# Patient Record
Sex: Male | Born: 1985 | Race: White | Hispanic: No | Marital: Single | Smoking: Former smoker
Health system: Southern US, Community
[De-identification: ages and names within clinical notes are randomized; demographics above are authoritative.]

## PROBLEM LIST (undated history)

## (undated) DIAGNOSIS — K219 Gastro-esophageal reflux disease without esophagitis: Secondary | ICD-10-CM

## (undated) DIAGNOSIS — I639 Cerebral infarction, unspecified: Secondary | ICD-10-CM

## (undated) DIAGNOSIS — E785 Hyperlipidemia, unspecified: Secondary | ICD-10-CM

## (undated) DIAGNOSIS — Z720 Tobacco use: Secondary | ICD-10-CM

## (undated) DIAGNOSIS — F909 Attention-deficit hyperactivity disorder, unspecified type: Secondary | ICD-10-CM

## (undated) DIAGNOSIS — I5022 Chronic systolic (congestive) heart failure: Secondary | ICD-10-CM

## (undated) DIAGNOSIS — I10 Essential (primary) hypertension: Secondary | ICD-10-CM

## (undated) HISTORY — PX: KNEE SURGERY: SHX244

## (undated) HISTORY — PX: EYE SURGERY: SHX253

---
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
## 2006-07-22 DIAGNOSIS — F909 Attention-deficit hyperactivity disorder, unspecified type: Secondary | ICD-10-CM | POA: Insufficient documentation

## 2012-09-08 ENCOUNTER — Encounter (HOSPITAL_COMMUNITY): Payer: Self-pay | Admitting: Emergency Medicine

## 2012-09-08 ENCOUNTER — Emergency Department (HOSPITAL_COMMUNITY)
Admission: EM | Admit: 2012-09-08 | Discharge: 2012-09-09 | Disposition: A | Discharge status: Home / Self Care | Payer: BC Managed Care – PPO | Attending: Emergency Medicine | Admitting: Emergency Medicine

## 2012-09-08 DIAGNOSIS — S61012A Laceration without foreign body of left thumb without damage to nail, initial encounter: Secondary | ICD-10-CM

## 2012-09-08 DIAGNOSIS — S61209A Unspecified open wound of unspecified finger without damage to nail, initial encounter: Secondary | ICD-10-CM | POA: Insufficient documentation

## 2012-09-08 DIAGNOSIS — Y990 Civilian activity done for income or pay: Secondary | ICD-10-CM | POA: Insufficient documentation

## 2012-09-08 DIAGNOSIS — Z79899 Other long term (current) drug therapy: Secondary | ICD-10-CM | POA: Insufficient documentation

## 2012-09-08 DIAGNOSIS — F17200 Nicotine dependence, unspecified, uncomplicated: Secondary | ICD-10-CM | POA: Insufficient documentation

## 2012-09-08 DIAGNOSIS — Y9289 Other specified places as the place of occurrence of the external cause: Secondary | ICD-10-CM | POA: Insufficient documentation

## 2012-09-08 DIAGNOSIS — W268XXA Contact with other sharp object(s), not elsewhere classified, initial encounter: Secondary | ICD-10-CM | POA: Insufficient documentation

## 2012-09-08 NOTE — ED Notes (Signed)
PT. ACCIDENTALLY HIT HIS LEFT THUMB WITH A KNIFE THIS EVENING , PRESENTS WITH LACERATION AT TIP OF LEFT THUMB , BLEEDING CONTROLLED , DRESSING APPLIED AT TRIAGE.

## 2012-09-09 NOTE — ED Provider Notes (Signed)
Medical screening examination/treatment/procedure(s) were performed by non-physician practitioner and as supervising physician I was immediately available for consultation/collaboration.   Aemon L Nasim Garofano, MD 09/09/12 0554 

## 2012-09-09 NOTE — ED Provider Notes (Signed)
History     CSN: [id number]  Arrival date & time 09/08/12  2210   First MD Initiated Contact with Patient 09/09/12 0124      Chief Complaint  Patient presents with  . Laceration    (Consider location/radiation/quality/duration/timing/severity/associated sxs/prior treatment) Patient is a 27 y.o. male presenting with skin laceration. The history is provided by the patient, the spouse and medical records. No language interpreter was used.  Laceration Location:  Finger Finger laceration location:  L thumb Length (cm):  6 Depth:  Cutaneous Quality: straight   Bleeding: controlled   Time since incident:  4 hours Laceration mechanism:  Razor Pain details:    Quality:  Aching   Severity:  Mild   Timing:  Constant   Progression:  Unchanged Foreign body present:  No foreign bodies Relieved by:  Nothing Worsened by:  Nothing tried Ineffective treatments:  None tried Tetanus status:  Up to date   Austin Tran is a 27 y.o. male  with no medical history presents to the Emergency Department complaining of heat, persistent, stabilized laceration to the left thumb approximately 4 hours prior to arrival. Patient states he was working in his wood shop with a box cutter when the box cutter slipped and cut his left thumb. He applied pressure dressing at home but had no other treatments.  Associated symptoms include pain at the site and bleeding.  Nothing makes it better and palpation makes it worse.  Pt denies fever, chills, headache, neck pain, numbness, tingling, weakness, syncope.  Patient is up-to-date and less than 5 years.    History reviewed. No pertinent past medical history.  Past Surgical History  Procedure Laterality Date  . Knee surgery      No family history on file.  History  Substance Use Topics  . Smoking status: Current Some Day Smoker  . Smokeless tobacco: Not on file  . Alcohol Use: Yes      Review of Systems  Constitutional: Negative for fever.  Skin:  Positive for wound.  Neurological: Negative for weakness and numbness.    Allergies  Review of patient's allergies indicates no known allergies.  Home Medications   Current Outpatient Rx  Name  Route  Sig  Dispense  Refill  . amphetamine-dextroamphetamine (ADDERALL) 30 MG tablet   Oral   Take 30 mg by mouth 2 (two) times daily.         Marland Kitchen doxycycline (VIBRAMYCIN) 100 MG capsule   Oral   Take 100 mg by mouth daily.           BP 150/88  Pulse 92  Temp(Src) 98.8 F (37.1 C) (Oral)  Resp 14  SpO2 98%  Physical Exam  Nursing note and vitals reviewed. Constitutional: He is oriented to person, place, and time. He appears well-developed and well-nourished. No distress.  HENT:  Head: Normocephalic and atraumatic.  Eyes: Conjunctivae are normal. No scleral icterus.  Neck: Normal range of motion.  Cardiovascular: Normal rate, regular rhythm, normal heart sounds and intact distal pulses.   No murmur heard. Capillary refill < 3 sec  Pulmonary/Chest: Effort normal and breath sounds normal. No respiratory distress. He has no wheezes.  Musculoskeletal: Normal range of motion. He exhibits no edema.  Full range of motion of all fingers and wrist of the left upper extremity  Neurological: He is alert and oriented to person, place, and time.  Sensation intact to dull and sharp Strength 5 out of 5 in the left upper extremity including fingers, wrist  and elbow  Skin: Skin is warm and dry. He is not diaphoretic. No erythema.  6 cm laceration to the palmer side of the thumb, bleeding controlled  Psychiatric: He has a normal mood and affect.    ED Course  LACERATION REPAIR Date/Time: 09/09/2012 1:57 AM Performed by: Dierdre Forth Authorized by: Dierdre Forth Consent: Verbal consent obtained. Risks and benefits: risks, benefits and alternatives were discussed Consent given by: patient Patient understanding: patient states understanding of the procedure being  performed Patient consent: the patient's understanding of the procedure matches consent given Procedure consent: procedure consent matches procedure scheduled Relevant documents: relevant documents present and verified Site marked: the operative site was marked Required items: required blood products, implants, devices, and special equipment available Patient identity confirmed: verbally with patient and arm band Time out: Immediately prior to procedure a "time out" was called to verify the correct patient, procedure, equipment, support staff and site/side marked as required. Body area: upper extremity Location details: left thumb Laceration length: 4 cm Foreign bodies: no foreign bodies Tendon involvement: none Nerve involvement: none Vascular damage: no Patient sedated: no Preparation: Patient was prepped and draped in the usual sterile fashion. Irrigation solution: saline and tap water Irrigation method: syringe and tap Amount of cleaning: standard Debridement: none Degree of undermining: none Skin closure: glue Approximation: close Approximation difficulty: simple Patient tolerance: Patient tolerated the procedure well with no immediate complications.   (including critical care time)  Labs Reviewed - No data to display No results found.   1. Laceration of left thumb, initial encounter       MDM  Austin Tran presents with laceration.  Laceration superficial without tendon or nerve involvement.  Patient requests Dermabond and no stitches.  Discussed the possible inadequacies of Dermabond and the patient prefers this treatment.  Tdap booster up to date. Pressure irrigation performed. Laceration occurred < 8 hours prior to repair which was well tolerated. Pt has no co morbidities to effect normal wound healing. Discussed glue home care w pt and answered questions. Pt to f-u for wound check in 7 days. Pt is hemodynamically stable w no complaints prior to dc.  I have also  discussed reasons to return immediately to the ER.  Patient expresses understanding and agrees with plan.           Austin Client Saahas Hidrogo, PA-C 09/09/12 0205

## 2015-11-20 ENCOUNTER — Encounter (HOSPITAL_COMMUNITY): Payer: Self-pay | Admitting: Emergency Medicine

## 2015-11-20 ENCOUNTER — Ambulatory Visit (INDEPENDENT_AMBULATORY_CARE_PROVIDER_SITE_OTHER): Payer: BLUE CROSS/BLUE SHIELD | Admitting: Family Medicine

## 2015-11-20 ENCOUNTER — Emergency Department (HOSPITAL_COMMUNITY): Admission: EM | Admit: 2015-11-20 | Discharge status: Absent without leave | Payer: Self-pay

## 2015-11-20 ENCOUNTER — Emergency Department (HOSPITAL_COMMUNITY): Payer: BLUE CROSS/BLUE SHIELD

## 2015-11-20 ENCOUNTER — Inpatient Hospital Stay (HOSPITAL_COMMUNITY)
Admission: EM | Admit: 2015-11-20 | Discharge: 2015-11-23 | DRG: 065 | Disposition: A | Discharge status: Home / Self Care | Payer: BLUE CROSS/BLUE SHIELD | Attending: Internal Medicine | Admitting: Internal Medicine

## 2015-11-20 VITALS — BP 132/90 | HR 115 | Temp 97.9°F | Resp 22 | Ht 70.25 in | Wt 197.2 lb

## 2015-11-20 DIAGNOSIS — R4701 Aphasia: Secondary | ICD-10-CM | POA: Diagnosis present

## 2015-11-20 DIAGNOSIS — F909 Attention-deficit hyperactivity disorder, unspecified type: Secondary | ICD-10-CM | POA: Diagnosis present

## 2015-11-20 DIAGNOSIS — I420 Dilated cardiomyopathy: Secondary | ICD-10-CM

## 2015-11-20 DIAGNOSIS — E785 Hyperlipidemia, unspecified: Secondary | ICD-10-CM | POA: Diagnosis present

## 2015-11-20 DIAGNOSIS — F1010 Alcohol abuse, uncomplicated: Secondary | ICD-10-CM | POA: Diagnosis present

## 2015-11-20 DIAGNOSIS — F17210 Nicotine dependence, cigarettes, uncomplicated: Secondary | ICD-10-CM | POA: Diagnosis present

## 2015-11-20 DIAGNOSIS — I6340 Cerebral infarction due to embolism of unspecified cerebral artery: Principal | ICD-10-CM | POA: Diagnosis present

## 2015-11-20 DIAGNOSIS — R29701 NIHSS score 1: Secondary | ICD-10-CM | POA: Diagnosis present

## 2015-11-20 DIAGNOSIS — I639 Cerebral infarction, unspecified: Secondary | ICD-10-CM

## 2015-11-20 DIAGNOSIS — F05 Delirium due to known physiological condition: Secondary | ICD-10-CM

## 2015-11-20 DIAGNOSIS — Z79899 Other long term (current) drug therapy: Secondary | ICD-10-CM

## 2015-11-20 DIAGNOSIS — Z8249 Family history of ischemic heart disease and other diseases of the circulatory system: Secondary | ICD-10-CM

## 2015-11-20 DIAGNOSIS — I63412 Cerebral infarction due to embolism of left middle cerebral artery: Secondary | ICD-10-CM | POA: Diagnosis present

## 2015-11-20 HISTORY — DX: Attention-deficit hyperactivity disorder, unspecified type: F90.9

## 2015-11-20 NOTE — ED Notes (Signed)
Pt. Made aware of the need for urine.

## 2015-11-20 NOTE — Progress Notes (Signed)
   HPI  Patient presents today here with slurred speech and confusion.   Pt and his girlfriend explain that he woke up this am with a confusional state, he had difficulty forming words and sentences at that time. He had what they describe as a panic attack and made himself vomit wondering if he had ingested something causing symptoms. He vomited several more times and his girlfreind states that he was sweaty.   Since then he has calmed down but continues to have difficulty forming sentences. He denies any HA, weakness, numbness or tinglingm, or other difficulty.   Last night he went out and had 2-3 beers. He is on adderall for the last 15 years or so.   He denies any substance use other than prescribed adderall and alcohol  PMH: Smoking status noted, has ADHD Surgical Hx- eye surgery Family Hx - states not sure Drinks 3-4 drinks per week, denies substance use ROS: Per HPI  Objective: BP 132/90 mmHg  Pulse 115  Temp(Src) 97.9 F (36.6 C) (Oral)  Resp 22  Ht 5' 10.25" (1.784 m)  Wt 197 lb 3.2 oz (89.449 kg)  BMI 28.11 kg/m2  SpO2 100% Gen: NAD, alert, cooperative with exam HEENT: NCAT, EOMI, PERRL  CV: RRR, good S1/S2, no murmur Resp: CTABL, no wheezes, non-labored Ext: No edema, warm Neuro: Alert and oriented, Strength 5/5 and sensation intact in BL Upper and lower extremities CN 2-12 intact, Symmetric smile, normal but slow gait.  Speech slightlyu slurred and he has some difficulty forming sentences and thoughts. He appears frustrated with the symptoms.   Assessment and plan:  # Confusion and slurred speech Given his symptoms for 6-8 hours I have recommended transfer to the ED via ambulance for possible stroke work up He declines EMS but states he will go straight to the ED with girlfriend to drive him.  Consider also substance ingestion Appreciate ED eval and treatment, triage nursing at ED has been notified. Pt will go to [place].     Kevin Fenton, MD 3:45  PM

## 2015-11-20 NOTE — ED Notes (Signed)
Not in lobby

## 2015-11-20 NOTE — ED Provider Notes (Signed)
CSN: [id number]     Arrival date & time 11/20/15  2155 History  By signing my name below, I, Emmanuella Mensah, attest that this documentation has been prepared under the direction and in the presence of Paula Libra, MD. Electronically Signed: Angelene Giovanni, ED Scribe. 11/20/2015. 11:13 PM.     Chief Complaint  Patient presents with  . Aphasia   The history is provided by the patient. No language interpreter was used.    HPI Comments: Austin Tran is a 30 y.o. male who presents to the Emergency Department complaining of a gradually improving episode of moderate expressive aphasia (difficulty expressing words and forming sentences) that was first noticed at 12:30 pm today after awakening from a nap. There are no specific mitigating or exacerbating factors.  He reports associated, vaguely described, bilateral visual disturbance with trouble reading that lasted for 15 minutes. He forced himself to vomit once thinking he might have been poisoned.   He denies any difficulty understanding what people are saying to him. Pt was seen at Urgent [facility] Today and was advised to come to the ED for further evaluation. For the past few weeks he has been having intermittent episodes while sleeping where is awakened by breathing difficulty (gasping for air). Pt has not tried any medications PTA. He reports that his father and grandfather both use a C-PAP for OSA. He denies any numbness, weakness, headache, involuntary vomiting, diarrhea, fever, chills, chest pain, or SOB.   History reviewed. No pertinent past medical history. Past Surgical History  Procedure Laterality Date  . Knee surgery    . Eye surgery     Family History  Problem Relation Age of Onset  . Hypertension Mother   . Hypertension Father   . Cancer Maternal Grandmother    Social History  Substance Use Topics  . Smoking status: Current Some Day Smoker    Types: Cigarettes  . Smokeless tobacco: None  . Alcohol Use: 7.2  oz/week    12 Cans of beer per week    Review of Systems  All other systems reviewed and are negative.   Allergies  Review of patient's allergies indicates no known allergies.  Home Medications   Prior to Admission medications   Medication Sig Start Date End Date Taking? Authorizing Provider  acetaminophen (TYLENOL) 500 MG tablet Take 1,000 mg by mouth every 6 (six) hours as needed for mild pain.   Yes Historical Provider, MD  amphetamine-dextroamphetamine (ADDERALL) 30 MG tablet Take 30 mg by mouth 2 (two) times daily.   Yes Historical Provider, MD   BP 112/81 mmHg  Pulse 101  Temp(Src) 97.7 F (36.5 C) (Oral)  Resp 18  Ht  (1.778 m)  Wt 197 lb (89.359 kg)  BMI 28.27 kg/m2  SpO2 97%   Physical Exam  Nursing note and vitals reviewed. General: Well-developed, well-nourished male in no acute distress; appearance consistent with age of record HENT: normocephalic; atraumatic Eyes: pupils equal, round and reactive to light; extraocular muscles intact Neck: supple; no bruit Heart: regular rate and rhythm Lungs: clear to auscultation bilaterally Abdomen: soft; nondistended; nontender; no masses or hepatosplenomegaly; bowel sounds present Extremities: No deformity; full range of motion; pulses normal Neurologic: Awake, alert and oriented; motor function intact in all extremities and symmetric; no facial droop; visual fields intact; negative Romberg; normal finger to nose; sensation intact and symmetric; occasional stumbling over words when speaking; no dysarthria; no ataxia Skin: Warm and dry Psychiatric: Normal mood and affect  ED Course  Procedures (including critical care time)   MDM  Nursing notes and vitals signs, including pulse oximetry, reviewed.  Summary of this visit's results, reviewed by myself:   EKG Interpretation  Date/Time:  Wednesday November 20 2015 23:57:51 EDT Ventricular Rate:  109 PR Interval:    QRS Duration: 100 QT Interval:  341 QTC  Calculation: 460 R Axis:   32 Text Interpretation:  Sinus tachycardia LAE, consider biatrial enlargement Nonspecific T abnormalities, lateral leads No previous ECGs available Confirmed by Mylo Choi  MD, Jonny Ruiz ([id number]) on 11/21/2015 12:02:14 AM Also confirmed by Read Drivers  MD, Jonny Ruiz ([id number]), editor Wandalee Ferdinand [phone])  on 11/21/2015 7:33:48 AM       Labs:  Results for orders placed or performed during the hospital encounter of 11/20/15 (from the past 24 hour(s))  Protime-INR     Status: None   Collection Time: 11/21/15 12:45 AM  Result Value Ref Range   Prothrombin Time 14.7 11.6 - 15.2 seconds   INR 1.18 0.00 - 1.49  APTT     Status: None   Collection Time: 11/21/15 12:45 AM  Result Value Ref Range   aPTT 30 24 - 37 seconds  CBC     Status: None   Collection Time: 11/21/15 12:45 AM  Result Value Ref Range   WBC 10.5 4.0 - 10.5 K/uL   RBC 5.11 4.22 - 5.81 MIL/uL   Hemoglobin 15.8 13.0 - 17.0 g/dL   HCT 91.4 78.2 - 95.6 %   MCV 87.1 78.0 - 100.0 fL   MCH 30.9 26.0 - 34.0 pg   MCHC 35.5 30.0 - 36.0 g/dL   RDW 21.3 08.6 - 57.8 %   Platelets 257 150 - 400 K/uL  Differential     Status: None   Collection Time: 11/21/15 12:45 AM  Result Value Ref Range   Neutrophils Relative % 63 %   Neutro Abs 6.6 1.7 - 7.7 K/uL   Lymphocytes Relative 28 %   Lymphs Abs 2.9 0.7 - 4.0 K/uL   Monocytes Relative 8 %   Monocytes Absolute 0.8 0.1 - 1.0 K/uL   Eosinophils Relative 1 %   Eosinophils Absolute 0.1 0.0 - 0.7 K/uL   Basophils Relative 0 %   Basophils Absolute 0.0 0.0 - 0.1 K/uL  Comprehensive metabolic panel     Status: Abnormal   Collection Time: 11/21/15 12:45 AM  Result Value Ref Range   Sodium 137 135 - 145 mmol/L   Potassium 4.0 3.5 - 5.1 mmol/L   Chloride 106 101 - 111 mmol/L   CO2 20 (L) 22 - 32 mmol/L   Glucose, Bld 98 65 - 99 mg/dL   BUN 17 6 - 20 mg/dL   Creatinine, Ser 4.69 0.61 - 1.24 mg/dL   Calcium 9.1 8.9 - 62.9 mg/dL   Total Protein 7.0 6.5 - 8.1 g/dL   Albumin 4.1 3.5 -  5.0 g/dL   AST 24 15 - 41 U/L   ALT 26 17 - 63 U/L   Alkaline Phosphatase 68 38 - 126 U/L   Total Bilirubin 1.1 0.3 - 1.2 mg/dL   GFR calc non Af Amer >60 >60 mL/min   GFR calc Af Amer >60 >60 mL/min   Anion gap 11 5 - 15  Ethanol     Status: None   Collection Time: 11/21/15 12:45 AM  Result Value Ref Range   Alcohol, Ethyl (B) <5 <5 mg/dL  Urine rapid drug screen (hosp performed)not at [facility]  Status: Abnormal   Collection Time: 11/21/15 12:47 AM  Result Value Ref Range   Opiates NONE DETECTED NONE DETECTED   Cocaine NONE DETECTED NONE DETECTED   Benzodiazepines NONE DETECTED NONE DETECTED   Amphetamines POSITIVE (A) NONE DETECTED   Tetrahydrocannabinol NONE DETECTED NONE DETECTED   Barbiturates NONE DETECTED NONE DETECTED  Urinalysis, Routine w reflex microscopic (not at [facility])     Status: Abnormal   Collection Time: 11/21/15 12:47 AM  Result Value Ref Range   Color, Urine AMBER (A) YELLOW   APPearance CLEAR CLEAR   Specific Gravity, Urine 1.031 (H) 1.005 - 1.030   pH 5.5 5.0 - 8.0   Glucose, UA NEGATIVE NEGATIVE mg/dL   Hgb urine dipstick NEGATIVE NEGATIVE   Bilirubin Urine SMALL (A) NEGATIVE   Ketones, ur 15 (A) NEGATIVE mg/dL   Protein, ur NEGATIVE NEGATIVE mg/dL   Nitrite NEGATIVE NEGATIVE   Leukocytes, UA NEGATIVE NEGATIVE  I-stat troponin, ED (not at [facility])     Status: None   Collection Time: 11/21/15 12:57 AM  Result Value Ref Range   Troponin i, poc 0.04 0.00 - 0.08 ng/mL   Comment 3          Antithrombin III     Status: None   Collection Time: 11/21/15 10:21 AM  Result Value Ref Range   AntiThromb III Func 98 75 - 120 %  Sedimentation rate     Status: None   Collection Time: 11/21/15  8:10 PM  Result Value Ref Range   Sed Rate 1 0 - 16 mm/hr    Imaging Studies: Ct Head Wo Contrast  11/21/2015  CLINICAL DATA:  30 year old male with a lesion. EXAM: CT HEAD WITHOUT CONTRAST TECHNIQUE: Contiguous axial images were obtained from the base of the skull  through the vertex without intravenous contrast. COMPARISON:  Head CT dated 10/15/2002 FINDINGS: The ventricles and the sulci are appropriate in size for the patient's age. There is no intracranial hemorrhage. No midline shift or mass effect identified. The gray-white matter differentiation is preserved. 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 The visualized paranasal sinuses and mastoid air cells are well aerated. The calvarium is intact. IMPRESSION: No acute intracranial pathology. Electronically Signed   By: Elgie Collard M.D.   On: 11/21/2015 00:00   Mr Brain Wo Contrast  11/21/2015  CLINICAL DATA:  Initial evaluation for acute onset expressive aphasia. EXAM: MRI HEAD WITHOUT CONTRAST TECHNIQUE: Multiplanar, multiecho pulse sequences of the brain and surrounding structures were obtained without intravenous contrast. COMPARISON:  Prior CT from 11/20/2015. FINDINGS: Cerebral volume within normal limits for patient age. No significant white matter disease present. There is abnormal gyriform restricted diffusion involving the cortical gray matter of the posterior left temporal lobe, suspicious for acute ischemic infarct. Associated T2/FLAIR abnormality without significant mass effect. No associated hemorrhage. No other abnormal foci of restricted diffusion. Gray-white matter differentiation otherwise maintained. Major intracranial vascular flow voids are preserved. No other areas of chronic infarction. No acute or chronic intracranial hemorrhage. No mass lesion, midline shift, or mass effect. No hydrocephalus. 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 No other extra-axial fluid collection. Major dural sinuses are grossly patent. Craniocervical junction within normal limits. Visualized upper cervical spine unremarkable. Pituitary gland within normal limits. No acute abnormality about the globes and orbits.  Mild scattered mucosal thickening within the ethmoidal air cells and maxillary sinuses. Paranasal sinuses are otherwise clear. No  mastoid effusion. Inner ear structures grossly normal. Bone marrow signal intensity within normal limits. No scalp soft tissue abnormality. IMPRESSION: 1. 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 No associated hemorrhage or mass effect. 2. Otherwise negative brain MRI. Electronically Signed   By: Rise Mu M.D.   On: 11/21/2015 07:29   2:02 AM No significant change in neurologic exam. Patient girlfriend states he still has difficulty with about every 10th word. Discussed with Dr. Roseanne Reno of neurology who recommends an MRI later this morning.  7:05 AM Awaiting results of MRI. Dr. Cyndie Chime will follow up on results and make disposition.   Final diagnoses:  Aphasia  Cerebral infarction due to unspecified mechanism   I personally performed the services described in this documentation, which was scribed in my presence. The recorded information has been reviewed and is accurate.   Paula Libra, MD 11/21/15 2251

## 2015-11-20 NOTE — ED Notes (Signed)
Pt being sent from UC, UC wanted pt to go to ED by ambulance but pt refused. Possible CVA.

## 2015-11-20 NOTE — ED Notes (Signed)
Pt stated he got out of bed at 1000 this morning and got to the bathroom and realized he was having trouble speaking and forming a sentence.  Pt states he had a panic attack because he did not know what was going on.  He also states that he has been having regular panic attacks for the past 2 weeks that he has never had before. On assessment, pt is forming sentences well with occasional mumbling.  Pt states he was nauseated but ate something before arrival and feels better.

## 2015-11-20 NOTE — Patient Instructions (Addendum)
   Please go directly to the emergency department    IF you received an x-ray today, you will receive an invoice from [facility]. Please contact [facility] at [phone] with questions or concerns regarding your invoice.   IF you received labwork today, you will receive an invoice from [employer]. Please contact [facility] at [phone] with questions or concerns regarding your invoice.   Our billing staff will not be able to assist you with questions regarding bills from these companies.  You will be contacted with the lab results as soon as they are available. The fastest way to get your results is to activate your My Chart account. Instructions are located on the last page of this paperwork. If you have not heard from [place] regarding the results in 2 weeks, please contact this office.

## 2015-11-20 NOTE — ED Notes (Signed)
Not present in lobby 

## 2015-11-21 ENCOUNTER — Observation Stay (HOSPITAL_COMMUNITY): Admit: 2015-11-21 | Payer: BLUE CROSS/BLUE SHIELD

## 2015-11-21 ENCOUNTER — Encounter (HOSPITAL_COMMUNITY): Payer: Self-pay | Admitting: Neurology

## 2015-11-21 ENCOUNTER — Observation Stay (HOSPITAL_COMMUNITY): Payer: BLUE CROSS/BLUE SHIELD

## 2015-11-21 ENCOUNTER — Observation Stay (HOSPITAL_BASED_OUTPATIENT_CLINIC_OR_DEPARTMENT_OTHER): Payer: BLUE CROSS/BLUE SHIELD

## 2015-11-21 ENCOUNTER — Emergency Department (HOSPITAL_COMMUNITY): Payer: BLUE CROSS/BLUE SHIELD

## 2015-11-21 DIAGNOSIS — I6789 Other cerebrovascular disease: Secondary | ICD-10-CM

## 2015-11-21 DIAGNOSIS — I639 Cerebral infarction, unspecified: Secondary | ICD-10-CM | POA: Diagnosis present

## 2015-11-21 LAB — RAPID URINE DRUG SCREEN, HOSP PERFORMED
AMPHETAMINES: POSITIVE — AB
BARBITURATES: NOT DETECTED
BENZODIAZEPINES: NOT DETECTED
COCAINE: NOT DETECTED
Opiates: NOT DETECTED
Tetrahydrocannabinol: NOT DETECTED

## 2015-11-21 LAB — URINALYSIS, ROUTINE W REFLEX MICROSCOPIC
GLUCOSE, UA: NEGATIVE mg/dL
HGB URINE DIPSTICK: NEGATIVE
Ketones, ur: 15 mg/dL — AB
Leukocytes, UA: NEGATIVE
Nitrite: NEGATIVE
Protein, ur: NEGATIVE mg/dL
SPECIFIC GRAVITY, URINE: 1.031 — AB (ref 1.005–1.030)
pH: 5.5 (ref 5.0–8.0)

## 2015-11-21 LAB — DIFFERENTIAL
BASOS ABS: 0 10*3/uL (ref 0.0–0.1)
BASOS PCT: 0 %
Eosinophils Absolute: 0.1 10*3/uL (ref 0.0–0.7)
Eosinophils Relative: 1 %
LYMPHS PCT: 28 %
Lymphs Abs: 2.9 10*3/uL (ref 0.7–4.0)
MONO ABS: 0.8 10*3/uL (ref 0.1–1.0)
Monocytes Relative: 8 %
NEUTROS ABS: 6.6 10*3/uL (ref 1.7–7.7)
NEUTROS PCT: 63 %

## 2015-11-21 LAB — ETHANOL

## 2015-11-21 LAB — CBC
HCT: 44.5 % (ref 39.0–52.0)
Hemoglobin: 15.8 g/dL (ref 13.0–17.0)
MCH: 30.9 pg (ref 26.0–34.0)
MCHC: 35.5 g/dL (ref 30.0–36.0)
MCV: 87.1 fL (ref 78.0–100.0)
PLATELETS: 257 10*3/uL (ref 150–400)
RBC: 5.11 MIL/uL (ref 4.22–5.81)
RDW: 12.7 % (ref 11.5–15.5)
WBC: 10.5 10*3/uL (ref 4.0–10.5)

## 2015-11-21 LAB — COMPREHENSIVE METABOLIC PANEL
ALT: 26 U/L (ref 17–63)
AST: 24 U/L (ref 15–41)
Albumin: 4.1 g/dL (ref 3.5–5.0)
Alkaline Phosphatase: 68 U/L (ref 38–126)
Anion gap: 11 (ref 5–15)
BUN: 17 mg/dL (ref 6–20)
CHLORIDE: 106 mmol/L (ref 101–111)
CO2: 20 mmol/L — AB (ref 22–32)
CREATININE: 0.97 mg/dL (ref 0.61–1.24)
Calcium: 9.1 mg/dL (ref 8.9–10.3)
GFR calc Af Amer: >60 mL/min (ref >60)
GFR calc non Af Amer: >60 mL/min (ref >60)
Glucose, Bld: 98 mg/dL (ref 65–99)
POTASSIUM: 4 mmol/L (ref 3.5–5.1)
SODIUM: 137 mmol/L (ref 135–145)
Total Bilirubin: 1.1 mg/dL (ref 0.3–1.2)
Total Protein: 7 g/dL (ref 6.5–8.1)

## 2015-11-21 LAB — ECHOCARDIOGRAM COMPLETE
CHL CUP DOP CALC LVOT VTI: 13.5 cm
CHL CUP MV DEC (S): 190
CHL CUP STROKE VOLUME: 34 mL
EERAT: 6.02
EWDT: 190 ms
FS: 6 % — AB (ref 28–44)
HEIGHTINCHES: 70 in
IVS/LV PW RATIO, ED: 0.81
LA diam end sys: 48 mm
LA diam index: 2.32 cm/m2
LA vol A4C: 84.2 ml
LA vol index: 42.9 mL/m2
LASIZE: 48 mm
LAVOL: 88.9 mL
LV E/e'average: 6.02
LV PW d: 11.2 mm — AB (ref 0.6–1.1)
LV TDI E'MEDIAL: 5.33
LV e' LATERAL: 18.1 cm/s
LV sys vol index: 94 mL/m2
LV sys vol: 194 mL — AB (ref 21–61)
LVDIAVOL: 228 mL — AB (ref 62–150)
LVDIAVOLIN: 110 mL/m2
LVEEMED: 6.02
LVOT SV: 56 mL
LVOT area: 4.15 cm2
LVOT diameter: 23 mm
LVOTPV: 85 cm/s
MV Peak grad: 5 mmHg
MV pk E vel: 109 m/s
Simpson's disk: 15
TDI e' lateral: 18.1
WEIGHTICAEL: 3152 [oz_av]

## 2015-11-21 LAB — SEDIMENTATION RATE: SED RATE: 1 mm/h (ref 0–16)

## 2015-11-21 LAB — ANTITHROMBIN III: ANTITHROMB III FUNC: 98 % (ref 75–120)

## 2015-11-21 LAB — PROTIME-INR
INR: 1.18 (ref 0.00–1.49)
PROTHROMBIN TIME: 14.7 s (ref 11.6–15.2)

## 2015-11-21 LAB — I-STAT TROPONIN, ED: Troponin i, poc: 0.04 ng/mL (ref 0.00–0.08)

## 2015-11-21 LAB — APTT: APTT: 30 s (ref 24–37)

## 2015-11-21 MED ORDER — LORAZEPAM 2 MG/ML IJ SOLN
1.0000 mg | Freq: Once | INTRAMUSCULAR | Status: AC
  Administered 2015-11-21: 1 mg via INTRAVENOUS
  Filled 2015-11-21: qty 1

## 2015-11-21 MED ORDER — ASPIRIN 325 MG PO TABS
325.0000 mg | ORAL_TABLET | Freq: Every day | ORAL | Status: DC
  Administered 2015-11-21 – 2015-11-23 (×3): 325 mg via ORAL
  Filled 2015-11-21 (×3): qty 1

## 2015-11-21 MED ORDER — ONDANSETRON HCL 4 MG/5ML PO SOLN
4.0000 mg | Freq: Three times a day (TID) | ORAL | Status: DC | PRN
  Filled 2015-11-21: qty 5

## 2015-11-21 MED ORDER — SENNOSIDES-DOCUSATE SODIUM 8.6-50 MG PO TABS: 1.0000 | ORAL_TABLET | Freq: Every evening | ORAL | Status: DC | PRN

## 2015-11-21 MED ORDER — ASPIRIN 300 MG RE SUPP
300.0000 mg | Freq: Every day | RECTAL | Status: DC
Start: 2015-11-21 — End: 2015-11-22

## 2015-11-21 MED ORDER — ZOLPIDEM TARTRATE 5 MG PO TABS
5.0000 mg | ORAL_TABLET | Freq: Every evening | ORAL | Status: DC | PRN
  Administered 2015-11-21 – 2015-11-23 (×2): 5 mg via ORAL
  Filled 2015-11-21 (×2): qty 1

## 2015-11-21 MED ORDER — STROKE: EARLY STAGES OF RECOVERY BOOK
Freq: Once | Status: AC
  Administered 2015-11-21: 1
  Filled 2015-11-21: qty 1

## 2015-11-21 MED ORDER — ENOXAPARIN SODIUM 40 MG/0.4ML ~~LOC~~ SOLN
40.0000 mg | SUBCUTANEOUS | Status: DC
  Administered 2015-11-21 – 2015-11-22 (×2): 40 mg via SUBCUTANEOUS
  Filled 2015-11-21 (×2): qty 0.4

## 2015-11-21 NOTE — ED Notes (Signed)
MD at bedside. 

## 2015-11-21 NOTE — ED Notes (Signed)
PT request for blood to be pulled from IV

## 2015-11-21 NOTE — ED Notes (Signed)
Verbal order for Ativan IV for pt before going to MRI.

## 2015-11-21 NOTE — Evaluation (Signed)
Speech Language Pathology Evaluation Patient Details Name: Austin Tran MRN: [id number] DOB: 09-11-1985 Today's Date: 11/21/2015 Time: 9798-9211 SLP Time Calculation (min) (ACUTE ONLY): 29 min  Problem List:  Patient Active Problem List   Diagnosis Date Noted  . CVA (cerebral infarction) 11/21/2015  . ATTENTION DEFICIT, W/HYPERACTIVITY 07/22/2006   Past Medical History: History reviewed. No pertinent past medical history. Past Surgical History:  Past Surgical History  Procedure Laterality Date  . Knee surgery    . Eye surgery     HPI:  30 y.o. male with acute left temporal CVA.    Assessment / Plan / Recommendation Clinical Impression  Pt presents with an expressive more than receptive aphasia and higher level cognitive deficits. Verbal output is marked by phonemic and semantic paraphasias and word-finding errors. He has good awareness of his linguistic errors and often corrects himself, although Min cues provided for awareness of more cognitive and receptive language impairments. At baseline he is independent, living alone and working multiple jobs. Pt will benefit from OP SLP f/u and 24/7 supervision upon initial return.    SLP Assessment  Patient needs continued Speech Lanaguage Pathology Services    Follow Up Recommendations  Outpatient SLP;24 hour supervision/assistance    Frequency and Duration min 2x/week  2 weeks      SLP Evaluation Prior Functioning  Cognitive/Linguistic Baseline: Within functional limits Type of Home: House  Lives With: Alone (significant other is there often) Available Help at Discharge: Family;Friend(s) Vocation: Full time employment (works as a Production assistant, radio, Veterinary surgeon, and builds furniture)   Cognition  Overall Cognitive Status: Impaired/Different from baseline Arousal/Alertness: Awake/alert Orientation Level: Oriented to person;Oriented to place;Disoriented to time (said the year was 2027) Attention: Selective Selective Attention: Appears  intact Memory: Impaired Memory Impairment: Storage deficit Awareness: Appears intact Problem Solving: Impaired Problem Solving Impairment: Verbal complex Safety/Judgment: Appears intact    Comprehension  Auditory Comprehension Overall Auditory Comprehension: Impaired Commands: Impaired Complex Commands: 75-100% accurate    Expression Expression Primary Mode of Expression: Verbal Verbal Expression Overall Verbal Expression: Impaired Initiation: No impairment Level of Generative/Spontaneous Verbalization: Conversation Repetition: Impaired Level of Impairment: Sentence level Naming: Impairment Confrontation: Impaired Divergent: 0-24% accurate Verbal Errors: Semantic paraphasias;Phonemic paraphasias;Aware of errors Pragmatics: No impairment Non-Verbal Means of Communication: Not applicable   Oral / Motor  Oral Motor/Sensory Function Overall Oral Motor/Sensory Function: Within functional limits Motor Speech Overall Motor Speech: Appears within functional limits for tasks assessed   GO          Functional Assessment Tool Used: skilled clinical judgment Functional Limitations: Spoken language expressive Spoken Language Expression Current Status (H4174): At least 20 percent but less than 40 percent impaired, limited or restricted Spoken Language Expression Goal Status [phone]): At least 20 percent but less than 40 percent impaired, limited or restricted          Maxcine Ham, M.A. CCC-SLP [phone]  Maxcine Ham 11/21/2015, 2:22 PM

## 2015-11-21 NOTE — ED Provider Notes (Signed)
30 year old male accepted in sign out pending MRI results. MRI consistent with acute CVA with acute ischemic findings at the posterior left temporal lobe. Case was discussed with the neurologist on-call who asked that the patient be transferred over to [facility] and agreed that the patient needed to be admitted. Neurologist recommended medical admission. Case was discussed with the hospitalist who agreed with admission. Patient admitted under the care of the hospitalist with plan for patient to be admitted at [facility] with neurology to see the patient over at [facility]. Patient was updated on all results and plan of care.  Leta Baptist, MD 11/21/15 [phone]

## 2015-11-21 NOTE — ED Notes (Signed)
EKG given to EDP,Molpus,MD., for review. 

## 2015-11-21 NOTE — Progress Notes (Signed)
    [facility] has been requested to perform a transesophageal echocardiogram on Austin Tran for acute cerebrovascular accident.  After careful review of history and examination, the risks and benefits of transesophageal echocardiogram have been explained including risks of esophageal damage, perforation (1:10,000 risk), bleeding, pharyngeal hematoma as well as other potential complications associated with conscious sedation including aspiration, arrhythmia, respiratory failure and death. Alternatives to treatment were discussed, questions were answered. Patient is willing to proceed.   Hgb 15.8, platelets 257. BP stable with no requirement for pressor support.  Ellsworth Lennox, PA-C 11/21/2015 7:18 PM

## 2015-11-21 NOTE — H&P (Signed)
History and Physical  [name] WUJ:[id number] DOB: [birth year] DOA: 11/20/2015   PCP: Garth Schlatter, MD   Patient coming from: Home   Chief Complaint: Trouble speaking   HPI: Austin Tran is a 30 y.o. male with medical history significant for ADD who is being admitted due to CVA. Patient provides history, with girlfriend at the bedside in ER as well. He was normal until about 12:30pm 6/28, says he suddenly had trouble saying words, even though he could think of what he wanted to say. Denies illicit drug use, or new medications. No vision changes, confusion, hallucinations, extremity weakness, numbness, tingling. No fevers, chills, nausea. He also notes that when reading this AM he can see the words and process them but if he tries to read aloud he occasionally has trouble getting the words out.   ED Course: Patient had CT scan of the head without acute findings, but MRI of the brain showed acute CVA as noted below. tPA as not given as he was well outside the time window, and symptoms also started to improve.  Review of Systems: Please see HPI for pertinent positives and negatives. A complete 10 system review of systems are otherwise negative.  History reviewed. No pertinent past medical history. Past Surgical History  Procedure Laterality Date  . Knee surgery    . Eye surgery      Social History:  reports that he has been smoking Cigarettes.  He does not have any smokeless tobacco history on file. He reports that he drinks about 7.2 oz of alcohol per week. He reports that he does not use illicit drugs.   No Known Allergies  No family history on file.   Prior to Admission medications   Medication Sig Start Date End Date Taking? Authorizing Provider  acetaminophen (TYLENOL) 500 MG tablet Take 1,000 mg by mouth every 6 (six) hours as needed for mild pain.   Yes Historical Provider, MD  amphetamine-dextroamphetamine (ADDERALL) 30 MG tablet Take 30 mg by mouth 2 (two) times  daily.   Yes Historical Provider, MD    Physical Exam: BP 127/87 mmHg  Pulse 102  Temp(Src) 97.9 F (36.6 C) (Oral)  Resp 22  Ht 5\' 10"  (1.778 m)  Wt 89.359 kg (197 lb)  BMI 28.27 kg/m2  SpO2 96%  General:  Alert, oriented, calm, in no acute distress  Eyes: pupils round and reactive to light and accomodation, clear sclerea Neck: supple, no masses, trachea mildline  Cardiovascular: RRR, no murmurs or rubs, no peripheral edema  Respiratory: clear to auscultation bilaterally, no wheezes, no crackles  Abdomen: soft, nontender, nondistended, normal bowel tones heard  Skin: dry, no rashes  Musculoskeletal: no joint effusions, normal range of motion  Psychiatric: appropriate affect, normal speech  Neurologic: extraocular muscles intact, clear speech, moving all extremities with intact sensorium            Labs on Admission:  Basic Metabolic Panel:  Recent Labs Lab 11/21/15 0045  NA 137  K 4.0  CL 106  CO2 20*  GLUCOSE 98  BUN 17  CREATININE 0.97  CALCIUM 9.1   Liver Function Tests:  Recent Labs Lab 11/21/15 0045  AST 24  ALT 26  ALKPHOS 68  BILITOT 1.1  PROT 7.0  ALBUMIN 4.1   No results for input(s): LIPASE, AMYLASE in the last 168 hours. No results for input(s): AMMONIA in the last 168 hours. CBC:  Recent Labs Lab 11/21/15 0045  WBC 10.5  NEUTROABS 6.6  HGB 15.8  HCT 44.5  MCV 87.1  PLT 257   Cardiac Enzymes: No results for input(s): CKTOTAL, CKMB, CKMBINDEX, TROPONINI in the last 168 hours.  BNP (last 3 results) No results for input(s): BNP in the last 8760 hours.  ProBNP (last 3 results) No results for input(s): PROBNP in the last 8760 hours.  CBG: No results for input(s): GLUCAP in the last 168 hours.  Radiological Exams on Admission: Ct Head Wo Contrast  11/21/2015  CLINICAL DATA:  30 year old male with a lesion. EXAM: CT HEAD WITHOUT CONTRAST TECHNIQUE: Contiguous axial images were obtained from the base of the skull through the  vertex without intravenous contrast. COMPARISON:  Head CT dated 10/15/2002 FINDINGS: The ventricles and the sulci are appropriate in size for the patient's age. There is no intracranial hemorrhage. No midline shift or mass effect identified. The gray-white matter differentiation is preserved. 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 The visualized paranasal sinuses and mastoid air cells are well aerated. The calvarium is intact. IMPRESSION: No acute intracranial pathology. Electronically Signed   By: Elgie Collard M.D.   On: 11/21/2015 00:00   Mr Brain Wo Contrast  11/21/2015  CLINICAL DATA:  Initial evaluation for acute onset expressive aphasia. EXAM: MRI HEAD WITHOUT CONTRAST TECHNIQUE: Multiplanar, multiecho pulse sequences of the brain and surrounding structures were obtained without intravenous contrast. COMPARISON:  Prior CT from 11/20/2015. FINDINGS: Cerebral volume within normal limits for patient age. No significant white matter disease present. There is abnormal gyriform restricted diffusion involving the cortical gray matter of the posterior left temporal lobe, suspicious for acute ischemic infarct. Associated T2/FLAIR abnormality without significant mass effect. No associated hemorrhage. No other abnormal foci of restricted diffusion. Gray-white matter differentiation otherwise maintained. Major intracranial vascular flow voids are preserved. No other areas of chronic infarction. No acute or chronic intracranial hemorrhage. No mass lesion, midline shift, or mass effect. No hydrocephalus. 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 No other extra-axial fluid collection. Major dural sinuses are grossly patent. Craniocervical junction within normal limits. Visualized upper cervical spine unremarkable. Pituitary gland within normal limits. No acute abnormality about the globes and orbits. Mild  scattered mucosal thickening within the ethmoidal air cells and maxillary sinuses. Paranasal sinuses are otherwise clear. No mastoid effusion. Inner ear structures grossly normal. Bone marrow signal intensity within normal limits. No scalp soft tissue abnormality. IMPRESSION: 1. 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 No associated hemorrhage or mass effect. 2. Otherwise negative brain MRI. Electronically Signed   By: Rise Mu M.D.   On: 11/21/2015 07:29    EKG: Reviewed, sinus tachycardia.  Assessment/Plan Present on Admission:  . CVA (cerebral infarction) CVA (cerebral infarction) - unclear etiology, no tPA given due to being outside time window.  - ER MD spoke with neurology, pt will be transferred to [facility] for consult - NPO - ASA PO if passes RN swallow screen - SLP, PT, OT consults - check fasting lipids, Hb A1c - permissive hypertension - TTE, Carotid dopplers - hypercoagulable labs   DVT prophylaxis: Lovenox   Code Status: FULL   Family Communication: Discussed with patient and girlfriend.   Disposition Plan: Transfer to [facility], then likely home at discharge.   Consults called: ER MD has consulted Neurology.   Admission status: Observation.   Time spent: 62 minutes  Mir Vergie Living MD [facility] Pager [phone]  If 7PM-7AM, please  contact night-coverage [email or web address] Password [facility]  11/21/2015, 9:29 AM

## 2015-11-21 NOTE — ED Notes (Signed)
Report given to [facility].  ETA apporx 10-6mins

## 2015-11-21 NOTE — ED Notes (Signed)
Awaiting MRI of brain.

## 2015-11-21 NOTE — Progress Notes (Signed)
Patient admitted form [facility]. Patient alert and oriented x 4. Patient oriented to room and made comfortable. Tele placed and verified. MD paged to notify.

## 2015-11-21 NOTE — Consult Note (Signed)
Requesting Physician: Dr. Westley Gambles    Chief Complaint: stroke  History obtained from:  Patient     HPI:                                                                                                                                         Austin Tran is an 30 y.o. male who presented to the Emergency Department complaining of a gradually improving episode of moderate expressive aphasia (difficulty expressing words and forming sentences) that was first noticed at 12:30 pm yesterday after awakening from a nap. There are no specific mitigating or exacerbating factors.  Per chart he reported associated, vaguely described, bilateral visual disturbance with trouble reading that lasted for 15 minutes. He forced himself to vomit once thinking he might have been poisoned.   Currently he is feeling almost back to baseline. Girlfriend did not note any seizure activity. No known history of clotting deficiency in family.   Date last known well: 11/20/2015 Time last known well: Time: 12:30 tPA Given: No: minimal symptoms   History reviewed. No pertinent past medical history.  Past Surgical History  Procedure Laterality Date  . Knee surgery    . Eye surgery      Family History  Problem Relation Age of Onset  . Hypertension Mother   . Hypertension Father   . Cancer Maternal Grandmother    Social History:  reports that he has been smoking Cigarettes.  He does not have any smokeless tobacco history on file. He reports that he drinks about 7.2 oz of alcohol per week. He reports that he does not use illicit drugs.  Allergies: No Known Allergies  Medications:                                                                                                                           Prior to Admission:  Prescriptions prior to admission  Medication Sig Dispense Refill Last Dose  . acetaminophen (TYLENOL) 500 MG tablet Take 1,000 mg by mouth every 6 (six) hours as needed for mild pain.   11/20/2015  at Unknown time  . amphetamine-dextroamphetamine (ADDERALL) 30 MG tablet Take 30 mg by mouth 2 (two) times daily.   11/20/2015 at Unknown time   Scheduled: . aspirin  300 mg Rectal Daily   Or  . aspirin  325 mg Oral Daily  . enoxaparin (  LOVENOX) injection  40 mg Subcutaneous Q24H    ROS:                                                                                                                                       History obtained from the patient  General ROS: negative for - chills, fatigue, fever, night sweats, weight gain or weight loss Psychological ROS: negative for - behavioral disorder, hallucinations, memory difficulties, mood swings or suicidal ideation Ophthalmic ROS: negative for - blurry vision, double vision, eye pain or loss of vision ENT ROS: negative for - epistaxis, nasal discharge, oral lesions, sore throat, tinnitus or vertigo Allergy and Immunology ROS: negative for - hives or itchy/watery eyes Hematological and Lymphatic ROS: negative for - bleeding problems, bruising or swollen lymph nodes Endocrine ROS: negative for - galactorrhea, hair pattern changes, polydipsia/polyuria or temperature intolerance Respiratory ROS: negative for - cough, hemoptysis, shortness of breath or wheezing Cardiovascular ROS: negative for - chest pain, dyspnea on exertion, edema or irregular heartbeat Gastrointestinal ROS: negative for - abdominal pain, diarrhea, hematemesis, nausea/vomiting or stool incontinence Genito-Urinary ROS: negative for - dysuria, hematuria, incontinence or urinary frequency/urgency Musculoskeletal ROS: negative for - joint swelling or muscular weakness Neurological ROS: as noted in HPI Dermatological ROS: negative for rash and skin lesion changes  Neurologic Examination:                                                                                                      Blood pressure 121/83, pulse 102, temperature 98.4 F (36.9 C), temperature source Oral,  resp. rate 18, height 5\' 10"  (1.778 m), weight 89.359 kg (197 lb), SpO2 97 %.  HEENT-  Normocephalic, no lesions, without obvious abnormality.  Normal external eye and conjunctiva.  Normal TM's bilaterally.  Normal auditory canals and external ears. Normal external nose, mucus membranes and septum.  Normal pharynx. Cardiovascular- S1, S2 normal, pulses palpable throughout   Lungs- chest clear, no wheezing, rales, normal symmetric air entry Abdomen- normal findings: bowel sounds normal Extremities- no edema Lymph-no adenopathy palpable Musculoskeletal-no joint tenderness, deformity or swelling Skin-warm and dry, no hyperpigmentation, vitiligo, or suspicious lesions  Neurological Examination Mental Status: Alert, oriented, thought content appropriate.  Speech fluent without evidence of aphasia.  Able to follow 3 step commands without difficulty. Cranial Nerves: II:  Visual fields grossly normal, pupils equal, round, reactive to light and accommodation III,IV, VI: ptosis not present, extra-ocular motions intact bilaterally V,VII: smile symmetric, facial light touch sensation normal bilaterally  VIII: hearing normal bilaterally IX,X: uvula rises symmetrically XI: bilateral shoulder shrug XII: midline tongue extension Motor: Right : Upper extremity   5/5    Left:     Upper extremity   5/5  Lower extremity   5/5     Lower extremity   5/5 Tone and bulk:normal tone throughout; no atrophy noted Sensory: Pinprick and light touch intact throughout, bilaterally Deep Tendon Reflexes: 2+ and symmetric throughout Plantars: Right: downgoing   Left: downgoing Cerebellar: normal finger-to-nose, normal rapid alternating movements and normal heel-to-shin test Gait: not tested       Lab Results: Basic Metabolic Panel:  Recent Labs Lab 11/21/15 0045  NA 137  K 4.0  CL 106  CO2 20*  GLUCOSE 98  BUN 17  CREATININE 0.97  CALCIUM 9.1    Liver Function Tests:  Recent Labs Lab  11/21/15 0045  AST 24  ALT 26  ALKPHOS 68  BILITOT 1.1  PROT 7.0  ALBUMIN 4.1   No results for input(s): LIPASE, AMYLASE in the last 168 hours. No results for input(s): AMMONIA in the last 168 hours.  CBC:  Recent Labs Lab 11/21/15 0045  WBC 10.5  NEUTROABS 6.6  HGB 15.8  HCT 44.5  MCV 87.1  PLT 257    Cardiac Enzymes: No results for input(s): CKTOTAL, CKMB, CKMBINDEX, TROPONINI in the last 168 hours.  Lipid Panel: No results for input(s): CHOL, TRIG, HDL, CHOLHDL, VLDL, LDLCALC in the last 168 hours.  CBG: No results for input(s): GLUCAP in the last 168 hours.  Microbiology: No results found for this or any previous visit.  Coagulation Studies:  Recent Labs  11/21/15 0045  LABPROT 14.7  INR 1.18    Imaging: Ct Head Wo Contrast  11/21/2015  CLINICAL DATA:  30 year old male with a lesion. EXAM: CT HEAD WITHOUT CONTRAST TECHNIQUE: Contiguous axial images were obtained from the base of the skull through the vertex without intravenous contrast. COMPARISON:  Head CT dated 10/15/2002 FINDINGS: The ventricles and the sulci are appropriate in size for the patient's age. There is no intracranial hemorrhage. No midline shift or mass effect identified. The gray-white matter differentiation is preserved. 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 The visualized paranasal sinuses and mastoid air cells are well aerated. The calvarium is intact. IMPRESSION: No acute intracranial pathology. Electronically Signed   By: Elgie Collard M.D.   On: 11/21/2015 00:00   Mr Brain Wo Contrast  11/21/2015  CLINICAL DATA:  Initial evaluation for acute onset expressive aphasia. EXAM: MRI HEAD WITHOUT CONTRAST TECHNIQUE: Multiplanar, multiecho pulse sequences of the brain and surrounding structures were obtained without intravenous contrast. COMPARISON:  Prior CT from 11/20/2015. FINDINGS: Cerebral volume within  normal limits for patient age. No significant white matter disease present. There is abnormal gyriform restricted diffusion involving the cortical gray matter of the posterior left temporal lobe, suspicious for acute ischemic infarct. Associated T2/FLAIR abnormality without significant mass effect. No associated hemorrhage. No other abnormal foci of restricted diffusion. Gray-white matter differentiation otherwise maintained. Major intracranial vascular flow voids are preserved. No other areas of chronic infarction. No acute or chronic intracranial hemorrhage. No mass lesion, midline shift, or mass effect. No hydrocephalus. 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 No other extra-axial fluid collection. Major dural sinuses are grossly patent. Craniocervical junction within normal limits. Visualized upper cervical spine unremarkable. Pituitary gland within normal limits. No acute abnormality about the globes  and orbits. Mild scattered mucosal thickening within the ethmoidal air cells and maxillary sinuses. Paranasal sinuses are otherwise clear. No mastoid effusion. Inner ear structures grossly normal. Bone marrow signal intensity within normal limits. No scalp soft tissue abnormality. IMPRESSION: 1. 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 No associated hemorrhage or mass effect. 2. Otherwise negative brain MRI. Electronically Signed   By: Rise Mu M.D.   On: 11/21/2015 07:29       Assessment and plan discussed with with attending physician and they are in agreement.    Felicie Morn PA-C [facility] [phone]  11/21/2015, 1:12 PM   Assessment: 30 y.o. male with acute left temporal CVA.   Stroke Risk Factors - none  Recommend: 1. HgbA1c, fasting lipid panel 2. MRA  of the brain without contrast 3. PT consult, OT consult, Speech consult 4. Echocardiogram 5. Carotid dopplers 6. Prophylactic therapy-Antiplatelet  med: Aspirin - dose 325 mg daily 7. Risk factor modification 8. Telemetry monitoring 9. Frequent neuro checks 10 NPO until passes stroke swallow screen  11 please page stroke NP  Or  PA  Or MD from 8am -4 pm  as this patient from this time will be  followed by the stroke.   You can look them up on [email or web address]  Password [email or web address]

## 2015-11-21 NOTE — ED Notes (Signed)
Patient transported to MRI 

## 2015-11-21 NOTE — ED Notes (Signed)
Pt returned from MRI without difficulties.

## 2015-11-21 NOTE — Progress Notes (Signed)
  Echocardiogram 2D Echocardiogram has been performed.  Nolon Rod 11/21/2015, 3:41 PM

## 2015-11-21 NOTE — ED Notes (Signed)
Called floor to give report, RN unavailable at this time. Gave my name and number and awaiting a call back.

## 2015-11-21 NOTE — Progress Notes (Signed)
*  PRELIMINARY RESULTS* Vascular Ultrasound Carotid Duplex (Doppler) has been completed.  Preliminary findings: Bilateral: No significant ICA stenosis. Antegrade vertebral flow.    Farrel Demark, RDMS, RVT  11/21/2015, 3:48 PM

## 2015-11-21 NOTE — ED Notes (Signed)
Spoke with Dr Cyndie Chime regarding EMTALA needing to be completed so patient can be transported to [facility].  Was told she has to find out admitting MD before she can fill it out.

## 2015-11-22 ENCOUNTER — Encounter (HOSPITAL_COMMUNITY)
Admission: EM | Disposition: A | Discharge status: Home / Self Care | Payer: Self-pay | Source: Home / Self Care | Attending: Internal Medicine

## 2015-11-22 ENCOUNTER — Encounter (HOSPITAL_COMMUNITY): Payer: Self-pay | Admitting: Physician Assistant

## 2015-11-22 ENCOUNTER — Observation Stay (HOSPITAL_BASED_OUTPATIENT_CLINIC_OR_DEPARTMENT_OTHER): Payer: BLUE CROSS/BLUE SHIELD

## 2015-11-22 ENCOUNTER — Observation Stay (HOSPITAL_COMMUNITY): Payer: BLUE CROSS/BLUE SHIELD

## 2015-11-22 DIAGNOSIS — Z8249 Family history of ischemic heart disease and other diseases of the circulatory system: Secondary | ICD-10-CM | POA: Diagnosis not present

## 2015-11-22 DIAGNOSIS — E785 Hyperlipidemia, unspecified: Secondary | ICD-10-CM

## 2015-11-22 DIAGNOSIS — I63312 Cerebral infarction due to thrombosis of left middle cerebral artery: Secondary | ICD-10-CM | POA: Diagnosis not present

## 2015-11-22 DIAGNOSIS — F909 Attention-deficit hyperactivity disorder, unspecified type: Secondary | ICD-10-CM | POA: Diagnosis present

## 2015-11-22 DIAGNOSIS — R29701 NIHSS score 1: Secondary | ICD-10-CM | POA: Diagnosis present

## 2015-11-22 DIAGNOSIS — I420 Dilated cardiomyopathy: Secondary | ICD-10-CM | POA: Diagnosis present

## 2015-11-22 DIAGNOSIS — F17210 Nicotine dependence, cigarettes, uncomplicated: Secondary | ICD-10-CM | POA: Diagnosis present

## 2015-11-22 DIAGNOSIS — I340 Nonrheumatic mitral (valve) insufficiency: Secondary | ICD-10-CM

## 2015-11-22 DIAGNOSIS — I6340 Cerebral infarction due to embolism of unspecified cerebral artery: Secondary | ICD-10-CM | POA: Diagnosis present

## 2015-11-22 DIAGNOSIS — I63032 Cerebral infarction due to thrombosis of left carotid artery: Secondary | ICD-10-CM | POA: Diagnosis not present

## 2015-11-22 DIAGNOSIS — F1010 Alcohol abuse, uncomplicated: Secondary | ICD-10-CM | POA: Diagnosis present

## 2015-11-22 DIAGNOSIS — I639 Cerebral infarction, unspecified: Secondary | ICD-10-CM | POA: Diagnosis present

## 2015-11-22 DIAGNOSIS — R4701 Aphasia: Secondary | ICD-10-CM | POA: Diagnosis present

## 2015-11-22 DIAGNOSIS — I63412 Cerebral infarction due to embolism of left middle cerebral artery: Secondary | ICD-10-CM | POA: Diagnosis present

## 2015-11-22 DIAGNOSIS — Z79899 Other long term (current) drug therapy: Secondary | ICD-10-CM | POA: Diagnosis not present

## 2015-11-22 HISTORY — PX: TEE WITHOUT CARDIOVERSION: SHX5443

## 2015-11-22 LAB — CARDIOLIPIN ANTIBODIES, IGG, IGM, IGA
Anticardiolipin IgA: <9 APL U/mL (ref 0–11)
Anticardiolipin IgG: <9 GPL U/mL (ref 0–14)
Anticardiolipin IgM: 9 MPL U/mL (ref 0–12)

## 2015-11-22 LAB — PROTEIN C, TOTAL: Protein C, Total: 81 % (ref 60–150)

## 2015-11-22 LAB — LIPID PANEL
CHOL/HDL RATIO: 4.1 ratio
Cholesterol: 196 mg/dL (ref 0–200)
HDL: 48 mg/dL (ref >40)
LDL Cholesterol: 128 mg/dL — ABNORMAL HIGH (ref 0–99)
Triglycerides: 102 mg/dL (ref <150)
VLDL: 20 mg/dL (ref 0–40)

## 2015-11-22 LAB — LUPUS ANTICOAGULANT PANEL
DRVVT: 46.4 s (ref 0.0–47.0)
PTT LA: 41.2 s (ref 0.0–51.9)

## 2015-11-22 LAB — BETA-2-GLYCOPROTEIN I ABS, IGG/M/A

## 2015-11-22 LAB — ANTINUCLEAR ANTIBODIES, IFA: ANTINUCLEAR ANTIBODIES, IFA: NEGATIVE

## 2015-11-22 LAB — PROTEIN S ACTIVITY: Protein S Activity: 178 % — ABNORMAL HIGH (ref 63–140)

## 2015-11-22 LAB — PROTEIN S, TOTAL: Protein S Ag, Total: 179 % — ABNORMAL HIGH (ref 60–150)

## 2015-11-22 LAB — HOMOCYSTEINE: Homocysteine: 13.8 umol/L (ref 0.0–15.0)

## 2015-11-22 LAB — PROTEIN C ACTIVITY: Protein C Activity: 75 % (ref 73–180)

## 2015-11-22 SURGERY — ECHOCARDIOGRAM, TRANSESOPHAGEAL
Anesthesia: Moderate Sedation

## 2015-11-22 MED ORDER — FENTANYL CITRATE (PF) 100 MCG/2ML IJ SOLN
INTRAMUSCULAR | Status: DC | PRN
  Administered 2015-11-22 (×3): 25 ug via INTRAVENOUS

## 2015-11-22 MED ORDER — BUTAMBEN-TETRACAINE-BENZOCAINE 2-2-14 % EX AERO
INHALATION_SPRAY | CUTANEOUS | Status: DC | PRN
Start: 2015-11-22 — End: 2015-11-22
  Administered 2015-11-22: 2 via TOPICAL

## 2015-11-22 MED ORDER — ATORVASTATIN CALCIUM 10 MG PO TABS
20.0000 mg | ORAL_TABLET | Freq: Every day | ORAL | Status: DC
  Administered 2015-11-22: 20 mg via ORAL
  Filled 2015-11-22: qty 2

## 2015-11-22 MED ORDER — CARVEDILOL 3.125 MG PO TABS
3.1250 mg | ORAL_TABLET | Freq: Two times a day (BID) | ORAL | Status: DC
  Administered 2015-11-22 – 2015-11-23 (×2): 3.125 mg via ORAL
  Filled 2015-11-22 (×2): qty 1

## 2015-11-22 MED ORDER — AMPHETAMINE-DEXTROAMPHETAMINE 10 MG PO TABS: 30.0000 mg | ORAL_TABLET | Freq: Two times a day (BID) | ORAL | Status: DC

## 2015-11-22 MED ORDER — LORAZEPAM 1 MG PO TABS: 1.0000 mg | ORAL_TABLET | Freq: Four times a day (QID) | ORAL | Status: DC | PRN

## 2015-11-22 MED ORDER — IOPAMIDOL (ISOVUE-370) INJECTION 76%
INTRAVENOUS | Status: AC
  Filled 2015-11-22: qty 50

## 2015-11-22 MED ORDER — THIAMINE HCL 100 MG/ML IJ SOLN: 100.0000 mg | Freq: Every day | INTRAMUSCULAR | Status: DC

## 2015-11-22 MED ORDER — LORAZEPAM 2 MG/ML IJ SOLN: 1.0000 mg | Freq: Four times a day (QID) | INTRAMUSCULAR | Status: DC | PRN

## 2015-11-22 MED ORDER — DIPHENHYDRAMINE HCL 50 MG/ML IJ SOLN
INTRAMUSCULAR | Status: AC
  Filled 2015-11-22: qty 1

## 2015-11-22 MED ORDER — MIDAZOLAM HCL 10 MG/2ML IJ SOLN
INTRAMUSCULAR | Status: DC | PRN
  Administered 2015-11-22: 1 mg via INTRAVENOUS
  Administered 2015-11-22 (×3): 2 mg via INTRAVENOUS

## 2015-11-22 MED ORDER — FOLIC ACID 1 MG PO TABS
1.0000 mg | ORAL_TABLET | Freq: Every day | ORAL | Status: DC
  Administered 2015-11-22 – 2015-11-23 (×2): 1 mg via ORAL
  Filled 2015-11-22 (×2): qty 1

## 2015-11-22 MED ORDER — LISINOPRIL 5 MG PO TABS
5.0000 mg | ORAL_TABLET | Freq: Every day | ORAL | Status: DC
  Administered 2015-11-22 – 2015-11-23 (×2): 5 mg via ORAL
  Filled 2015-11-22 (×2): qty 1

## 2015-11-22 MED ORDER — LORAZEPAM 0.5 MG PO TABS: 0.5000 mg | ORAL_TABLET | Freq: Three times a day (TID) | ORAL | Status: DC | PRN

## 2015-11-22 MED ORDER — FENTANYL CITRATE (PF) 100 MCG/2ML IJ SOLN
INTRAMUSCULAR | Status: AC
  Filled 2015-11-22: qty 2

## 2015-11-22 MED ORDER — SODIUM CHLORIDE 0.9 % IV SOLN
INTRAVENOUS | Status: DC
  Administered 2015-11-22: 500 mL via INTRAVENOUS

## 2015-11-22 MED ORDER — IOPAMIDOL (ISOVUE-370) INJECTION 76%
INTRAVENOUS | Status: AC
Start: 2015-11-22 — End: 2015-11-22
  Administered 2015-11-22: 50 mL
  Filled 2015-11-22: qty 50

## 2015-11-22 MED ORDER — AMPHETAMINE-DEXTROAMPHETAMINE 10 MG PO TABS
30.0000 mg | ORAL_TABLET | ORAL | Status: DC
  Administered 2015-11-22: 30 mg via ORAL
  Filled 2015-11-22: qty 3

## 2015-11-22 MED ORDER — VITAMIN B-1 100 MG PO TABS
100.0000 mg | ORAL_TABLET | Freq: Every day | ORAL | Status: DC
  Administered 2015-11-22 – 2015-11-23 (×2): 100 mg via ORAL
  Filled 2015-11-22 (×2): qty 1

## 2015-11-22 MED ORDER — SIMVASTATIN 40 MG PO TABS
40.0000 mg | ORAL_TABLET | Freq: Every day | ORAL | Status: DC
  Filled 2015-11-22: qty 1

## 2015-11-22 MED ORDER — MIDAZOLAM HCL 5 MG/ML IJ SOLN
INTRAMUSCULAR | Status: AC
  Filled 2015-11-22: qty 2

## 2015-11-22 MED ORDER — ADULT MULTIVITAMIN W/MINERALS CH
1.0000 | ORAL_TABLET | Freq: Every day | ORAL | Status: DC
  Administered 2015-11-22 – 2015-11-23 (×2): 1 via ORAL
  Filled 2015-11-22 (×2): qty 1

## 2015-11-22 NOTE — H&P (View-Only) (Signed)
CARDIOLOGY CONSULT NOTE       Patient ID: Austin Tran MRN: [id number] DOB/AGE: 31/28/1987 31 y.o.  Admit date: 11/20/2015 Referring Physician:  [place] Primary Physician: AMEEN, WILLIAM OTIS, MD Primary Cardiologist:  New/Abriel Hattery Reason for Consultation:  Low EF / CVA   Active Problems:   CVA (cerebral infarction)   HPI:  30 y.o. admitted with CVA.  Left posterior temporal lobe. Noted issues finding words on Wendsday.  On adderall since 2004 for ADD.  No other meds. No previous Cardiac issues. No family history of premature stroke, hypercoagulability or congenital heart dx.  Speech is improved today. No palpitations chest pain or dyspnea. Had Bad virus for 2 months with coughing finally cleared in June.  No fevers.  Telemetry with no arrhythmia or PAF.  Echo from yesterday reviewed.  Study Conclusions  - Left ventricle: The cavity size was severely dilated. Wall  thickness was normal. Systolic function was severely reduced. The  estimated ejection fraction was in the range of 20% to 25%.  Diffuse hypokinesis. The study is not technically sufficient to  allow evaluation of LV diastolic function. - Mitral valve: Mildly thickened leaflets . There was mild  regurgitation. - Left atrium: Moderately dilated. - Right ventricle: The cavity size was moderately dilated. The  moderator band was prominent. Systolic function is reduced. - Tricuspid valve: There was no significant regurgitation. - Inferior vena cava: The vessel was dilated. The respirophasic  diameter changes were blunted (< 50%), consistent with elevated  central venous pressure.  Impressions:  - LVEF of 20-25%, severely dilated wtih global hypokinesis and  normal wall thickness. Mild MR, moderate LAE, moderate RVE with  reduced RV function, dilated IVC.   ROS All other systems reviewed and negative except as noted above  Past Medical History  Diagnosis Date  . ADHD (attention deficit  hyperactivity disorder)     on adderall since 2004    Family History  Problem Relation Age of Onset  . Hypertension Mother   . Hypertension Father   . Cancer Maternal Grandmother     Social History   Social History  . Marital Status: Single    Spouse Name: N/A  . Number of Children: N/A  . Years of Education: N/A   Occupational History  . Not on file.   Social History Main Topics  . Smoking status: Current Some Day Smoker    Types: Cigarettes  . Smokeless tobacco: Not on file  . Alcohol Use: 7.2 oz/week    12 Cans of beer per week     Comment: drink 4-5 beers a night, more on the weekend  . Drug Use: No  . Sexual Activity: Not on file   Other Topics Concern  . Not on file   Social History Narrative    Past Surgical History  Procedure Laterality Date  . Knee surgery    . Eye surgery       . aspirin  300 mg Rectal Daily   Or  . aspirin  325 mg Oral Daily  . enoxaparin (LOVENOX) injection  40 mg Subcutaneous Q24H      Physical Exam: Blood pressure 123/93, pulse 109, temperature 98.1 F (36.7 C), temperature source Oral, resp. rate 20, height 5' 10" (1.778 m), weight 197 lb (89.359 kg), SpO2 98 %.   Affect appropriate Healthy:  appears stated age HEENT: normal Neck supple with no adenopathy JVP normal no bruits no thyromegaly Lungs clear with no wheezing and good diaphragmatic motion Heart:  S1/S2   no murmur, no rub, gallop or click PMI  Enlarged  Abdomen: benighn, BS positve, no tenderness, no AAA no bruit.  No HSM or HJR Distal pulses intact with no bruits No edema Neuro non-focal mild word finding problems Skin warm and dry No muscular weakness   Labs:   Lab Results  Component Value Date   WBC 10.5 11/21/2015   HGB 15.8 11/21/2015   HCT 44.5 11/21/2015   MCV 87.1 11/21/2015   PLT 257 11/21/2015    Recent Labs Lab 11/21/15 0045  NA 137  K 4.0  CL 106  CO2 20*  BUN 17  CREATININE 0.97  CALCIUM 9.1  PROT 7.0  BILITOT 1.1  ALKPHOS  68  ALT 26  AST 24  GLUCOSE 98      Radiology: Ct Head Wo Contrast  11/21/2015  CLINICAL DATA:  30 year old male with a lesion. EXAM: CT HEAD WITHOUT CONTRAST TECHNIQUE: Contiguous axial images were obtained from the base of the skull through the vertex without intravenous contrast. COMPARISON:  Head CT dated 10/15/2002 FINDINGS: The ventricles and the sulci are appropriate in size for the patient's age. There is no intracranial hemorrhage. No midline shift or mass effect identified. The gray-white matter differentiation is preserved. 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 The visualized paranasal sinuses and mastoid air cells are well aerated. The calvarium is intact. IMPRESSION: No acute intracranial pathology. Electronically Signed   By: Elgie Collard M.D.   On: 11/21/2015 00:00   Mr Brain Wo Contrast  11/21/2015  CLINICAL DATA:  Initial evaluation for acute onset expressive aphasia. EXAM: MRI HEAD WITHOUT CONTRAST TECHNIQUE: Multiplanar, multiecho pulse sequences of the brain and surrounding structures were obtained without intravenous contrast. COMPARISON:  Prior CT from 11/20/2015. FINDINGS: Cerebral volume within normal limits for patient age. No significant white matter disease present. There is abnormal gyriform restricted diffusion involving the cortical gray matter of the posterior left temporal lobe, suspicious for acute ischemic infarct. Associated T2/FLAIR abnormality without significant mass effect. No associated hemorrhage. No other abnormal foci of restricted diffusion. Gray-white matter differentiation otherwise maintained. Major intracranial vascular flow voids are preserved. No other areas of chronic infarction. No acute or chronic intracranial hemorrhage. No mass lesion, midline shift, or mass effect. No hydrocephalus. 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 No other  extra-axial fluid collection. Major dural sinuses are grossly patent. Craniocervical junction within normal limits. Visualized upper cervical spine unremarkable. Pituitary gland within normal limits. No acute abnormality about the globes and orbits. Mild scattered mucosal thickening within the ethmoidal air cells and maxillary sinuses. Paranasal sinuses are otherwise clear. No mastoid effusion. Inner ear structures grossly normal. Bone marrow signal intensity within normal limits. No scalp soft tissue abnormality. IMPRESSION: 1. 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 No associated hemorrhage or mass effect. 2. Otherwise negative brain MRI. Electronically Signed   By: Rise Mu M.D.   On: 11/21/2015 07:29    EKG:  ST rate 109   ASSESSMENT AND PLAN:  CVA:  Possibly embolic from heart with DCM.  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  Start ACE and beta blocker MRI scanner down but may be worthwhile To f/u to assess myocardium ADD:  Hold adderall for now consider changing to non stimulant agent like stratterra  Signed: Charlton Haws 11/22/2015, 9:41 AM

## 2015-11-22 NOTE — Progress Notes (Signed)
OT NOTE  Ot spoke with PT Austin Tran regarding return to baseline mobility. Pt without acute needs.  Austin Tran   OTR/L Pager: [phone] Office: [phone] .

## 2015-11-22 NOTE — Interval H&P Note (Signed)
History and Physical Interval Note:  11/22/2015 12:17 PM  Austin Tran  has presented today for surgery, with the diagnosis of stroke  The various methods of treatment have been discussed with the patient and family. After consideration of risks, benefits and other options for treatment, the patient has consented to  Procedure(s): TRANSESOPHAGEAL ECHOCARDIOGRAM (TEE) (N/A) as a surgical intervention .  The patient's history has been reviewed, patient examined, no change in status, stable for surgery.  I have reviewed the patient's chart and labs.  Questions were answered to the patient's satisfaction.     Olga Millers

## 2015-11-22 NOTE — CV Procedure (Signed)
See full TEE report in [employer] Patient sedated with versed 7 mg and fentanyl 75 micrograms IV. Severe global reduction in LV function; severe RV dysfunction; negative saline microcavitation study. Olga Millers, MD

## 2015-11-22 NOTE — Progress Notes (Signed)
Echocardiogram Echocardiogram Transesophageal has been performed.  Austin Tran 11/22/2015, 12:47 PM

## 2015-11-22 NOTE — Progress Notes (Signed)
STROKE TEAM PROGRESS NOTE   HISTORY OF PRESENT ILLNESS (per record) Austin Tran is an 30 y.o. male who presented to the Emergency Department complaining of a gradually improving episode of moderate expressive aphasia (difficulty expressing words and forming sentences) that was first noticed at 12:30 pm yesterday 11/20/2015 ([facility]KW) after awakening from a nap. There are no specific mitigating or exacerbating factors. Per chart he reported associated, vaguely described, bilateral visual disturbance with trouble reading that lasted for 15 minutes. He forced himself to vomit once thinking he might have been poisoned. Currently he is feeling almost back to baseline. Girlfriend did not note any seizure activity. No known history of clotting deficiency in family. Patient was not administered IV t-PA secondary to minimal symptoms. He was admitted  for further evaluation and treatment.   SUBJECTIVE (INTERVAL HISTORY) His parents are at the bedside.  He reports he had some SOB while climbing the steps to his home - he felt it was related to his recent resistory illness - and he felt it was getting better. He also reports having panic attacks that could also have contributed. He is jst back from TEE. Overall he feels his condition is stable. Pt and Dr. Pearlean Brownie discussed presenting symptoms - sounds clearly stroke-like, not seizure.    OBJECTIVE Temp:  [95.7 F (35.4 C)-98.7 F (37.1 C)] 98.1 F (36.7 C) (06/30 1134) Pulse Rate:  [97-112] 97 (06/30 1257) Cardiac Rhythm:  [-] Sinus tachycardia (06/30 1257) Resp:  [16-42] 21 (06/30 1257) BP: (98-126)/(70-100) 121/94 mmHg (06/30 1257) SpO2:  [92 %-99 %] 94 % (06/30 1257) Weight:  [89.359 kg (197 lb)] 89.359 kg (197 lb) (06/30 1134)  CBC:  Recent Labs Lab 11/21/15 0045  WBC 10.5  NEUTROABS 6.6  HGB 15.8  HCT 44.5  MCV 87.1  PLT 257    Basic Metabolic Panel:  Recent Labs Lab 11/21/15 0045  NA 137  K 4.0  CL 106  CO2 20*  GLUCOSE 98  BUN 17   CREATININE 0.97  CALCIUM 9.1    Lipid Panel:    Component Value Date/Time   CHOL 196 11/22/2015 0816   TRIG 102 11/22/2015 0816   HDL 48 11/22/2015 0816   CHOLHDL 4.1 11/22/2015 0816   VLDL 20 11/22/2015 0816   LDLCALC 128* 11/22/2015 0816   HgbA1c: No results found for: HGBA1C Urine Drug Screen:    Component Value Date/Time   LABOPIA NONE DETECTED 11/21/2015 0047   COCAINSCRNUR NONE DETECTED 11/21/2015 0047   LABBENZ NONE DETECTED 11/21/2015 0047   AMPHETMU POSITIVE* 11/21/2015 0047   THCU NONE DETECTED 11/21/2015 0047   LABBARB NONE DETECTED 11/21/2015 0047      IMAGING  Ct Head Wo Contrast  11/21/2015  CLINICAL DATA:  30 year old male with a lesion. EXAM: CT HEAD WITHOUT CONTRAST TECHNIQUE: Contiguous axial images were obtained from the base of the skull through the vertex without intravenous contrast. COMPARISON:  Head CT dated 10/15/2002 FINDINGS: The ventricles and the sulci are appropriate in size for the patient's age. There is no intracranial hemorrhage. No midline shift or mass effect identified. The gray-white matter differentiation is preserved. 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 The visualized paranasal sinuses and mastoid air cells are well aerated. The calvarium is intact. IMPRESSION: No acute intracranial pathology. Electronically Signed   By: Elgie Collard M.D.   On: 11/21/2015 00:00   Mr Brain Wo Contrast  11/21/2015  CLINICAL DATA:  Initial evaluation for acute onset expressive aphasia. EXAM: MRI HEAD WITHOUT CONTRAST TECHNIQUE: Multiplanar, multiecho pulse sequences of the brain and surrounding structures were obtained without intravenous contrast. COMPARISON:  Prior CT from 11/20/2015. FINDINGS: Cerebral volume within normal limits for patient age. No significant white matter disease present. There is abnormal gyriform restricted diffusion involving the cortical gray matter  of the posterior left temporal lobe, suspicious for acute ischemic infarct. Associated T2/FLAIR abnormality without significant mass effect. No associated hemorrhage. No other abnormal foci of restricted diffusion. Gray-white matter differentiation otherwise maintained. Major intracranial vascular flow voids are preserved. No other areas of chronic infarction. No acute or chronic intracranial hemorrhage. No mass lesion, midline shift, or mass effect. No hydrocephalus. 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 No other extra-axial fluid collection. Major dural sinuses are grossly patent. Craniocervical junction within normal limits. Visualized upper cervical spine unremarkable. Pituitary gland within normal limits. No acute abnormality about the globes and orbits. Mild scattered mucosal thickening within the ethmoidal air cells and maxillary sinuses. Paranasal sinuses are otherwise clear. No mastoid effusion. Inner ear structures grossly normal. Bone marrow signal intensity within normal limits. No scalp soft tissue abnormality. IMPRESSION: 1. 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 No associated hemorrhage or mass effect. 2. Otherwise negative brain MRI. Electronically Signed   By: Rise Mu M.D.   On: 11/21/2015 07:29   2D Echocardiogram  - Left ventricle: The cavity size was severely dilated. Wall thickness was normal. Systolic function was severely reduced. The estimated ejection fraction was in the range of 20% to 25%. Diffuse hypokinesis. The study is not technically sufficient to allow evaluation of LV diastolic function. - Mitral valve: Mildly thickened leaflets . There was mild regurgitation. - Left atrium: Moderately dilated. - Right ventricle: The cavity size was moderately dilated. The moderator band was prominent. Systolic function is reduced. - Tricuspid valve: There was no significant regurgitation. - Inferior vena cava:  The vessel was dilated. The respirophasic diameter changes were blunted (< 50%), consistent with elevated central venous pressure. Impressions:  LVEF of 20-25%, severely dilated wtih global hypokinesis and normal wall thickness. Mild MR, moderate LAE, moderate RVE with reduced RV function, dilated IVC.  TEE  Severe global reduction in LV function; severe RV dysfunction; negative saline microcavitation study.    PHYSICAL EXAM Pleasant young healthy Caucasian male not in distress. . Afebrile. Head is nontraumatic. Neck is supple without bruit.    Cardiac exam no murmur or gallop. Lungs are clear to auscultation. Distal pulses are well felt. Neurological Exam :  Awake  Alert oriented x 3. Normal speech and language.eye movements full without nystagmus.fundi were not visualized. Vision acuity and fields appear normal. Hearing is normal. Palatal movements are normal. Face symmetric. Tongue midline. Normal strength, tone, reflexes and coordination. Normal sensation. Gait deferred. ASSESSMENT/PLAN [name] is a 30 y.o. male with history of ADD and recent viral illness presenting with difficulty speaking for a few days. He did not receive IV t-PA due to delay in arrival.   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 No source of embolus   TEE no SOE, low EF, LV fxn  No indication for loop from stroke standpoint  LDL 128  HgbA1c pending  Hypercoagulable labs pending   Lovenox 40 mg sq daily for VTE prophylaxis   NPO for TEE, ok to resume diet from stroke standpoint  No antithrombotic prior to admission, now on aspirin 325 mg daily  Patient counseled to be compliant with his antithrombotic medications  Ongoing aggressive stroke risk factor management  Therapy recommendations:  No therapy  needs  Disposition:  Return home  Hyperlipidemia  Home meds:  No statin  LDL 128, goal < 70  Added statin lipitor 20 mg daily  Continue statin at discharge  Other Stroke Risk Factors  Cigarette smoker, advised to stop smoking  ETOH use, advised to drink no more than 2 drink(s) a day  Overweight, Body mass index is 28.27 kg/(m^2)., recommend weight loss, diet and exercise as appropriate   Other Active Problems  ADD - ok to resume Adderall from Stroke standpoint. Agreeable to consider non-stimulant replacement. Defer to medical team  Hospital day #   Rhoderick Moody [facility] See Amion for Pager information 11/22/2015 1:37 PM  I have personally examined this patient, reviewed notes, independently viewed imaging studies, participated in medical decision making and plan of care. I have made any additions or clarifications directly to the above note. Agree with note above. He presented with transient expressive aphasia secondary to a left MCA branch embolic infarct source to be determined. He remains at risk for neurological worsening, recurrent stroke, TIA needs ongoing stroke evaluation. TEE shows no definite cardiac source of embolism but shows low ejection fraction possibly related to dilated cardiomyopathy pos  viral infection that he had one month ago. Recommend outpatient follow-up with cardiology and repeated echocardiogram and continue aspirin for stroke prevention. Follow-up with me as an outpatient in stroke clinic in 2 months or call earlier if necessary. Consider outpatient discussion with primary care physician to change Adderall to Strattera which is a non-stimulant I had a long discussion with the patient and his parents and answered questions.d/w Dr Waymon Amato. Greater than 50% of time during this 35 minute visit was spent on counseling and coordination of care about stroke risk, prevention and treatment  Delia Heady, MD Medical Director [facility] Pager: [phone] 11/22/2015 4:35 PM    To contact Stroke Continuity provider, please refer to [email or web address]. After hours, contact General Neurology

## 2015-11-22 NOTE — Evaluation (Signed)
Occupational Therapy Evaluation Patient Details Name: Austin Tran MRN: [id number] DOB: 13-Feb-1986 Today's Date: 11/22/2015    History of Present Illness 30 yo male found to have Posterior temporal CVA with 25% LV function. PMH: ADHD , Etoh use heavily knee surg eye surg   Clinical Impression   Patient evaluated by Occupational Therapy with no further acute OT needs identified. All education has been completed and the patient has no further questions. See below for any follow-up Occupational Therapy or equipment needs. OT to sign off. Thank you for referral.  Recommend follow up with SLP due to cognitive executive deficits noted . Pt reports anxiety with being at hospital. Pt completed basic transfers and reports movement out of room helped.      Follow Up Recommendations  No OT follow up    Equipment Recommendations  None recommended by OT    Recommendations for Other Services Speech consult     Precautions / Restrictions Precautions Precautions: None      Mobility Bed Mobility Overal bed mobility: Independent                Transfers Overall transfer level: Independent                    Balance Overall balance assessment: Independent                                          ADL Overall ADL's : Independent                                             Vision     Perception     Praxis      Pertinent Vitals/Pain Pain Assessment: No/denies pain     Hand Dominance Right   Extremity/Trunk Assessment Upper Extremity Assessment Upper Extremity Assessment: Overall WFL for tasks assessed   Lower Extremity Assessment Lower Extremity Assessment: Overall WFL for tasks assessed   Cervical / Trunk Assessment Cervical / Trunk Assessment: Normal   Communication Communication Communication: Expressive difficulties (word finding)   Cognition Arousal/Alertness: Awake/alert Behavior During Therapy: WFL for  tasks assessed/performed Overall Cognitive Status: Impaired/Different from baseline Area of Impairment: Memory               General Comments: executive functioning deficits noted. SLP recommending Outpatient already to address this issue. 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 Pt unaware of errors. pt advised not to drive at this time until MD clears for driving.    General Comments       Exercises       Shoulder Instructions      Home Living Family/patient expects to be discharged to:: Private residence Living Arrangements: Spouse/significant other Available Help at Discharge: Family;Friend(s) Type of Home: House Home Access: Stairs to enter [employer] of Steps: 12   Home Layout: One level     Bathroom Shower/Tub: Chief Strategy Officer: Standard     Home Equipment: None      Lives With: Alone    Prior Functioning/Environment Level of Independence: Independent             OT Diagnosis:     OT Problem List:  OT Treatment/Interventions:      OT Goals(Current goals can be found in the care plan section) Acute Rehab OT Goals Patient Stated Goal: to return to work OT Goal Formulation: With patient Potential to Achieve Goals: Good  OT Frequency:     Barriers to D/C:            Co-evaluation              End of Session Equipment Utilized During Treatment: Gait belt  Activity Tolerance: Patient tolerated treatment well Patient left: in bed;with call bell/phone within reach;with family/visitor present   Time: 9741-6384 OT Time Calculation (min): 14 min Charges:  OT General Charges $OT Visit: 1 Procedure OT Evaluation $OT Eval Moderate Complexity: 1 Procedure G-Codes:    Austin Tran 2015/12/10, 10:38 AM  Austin Tran   OTR/L Pager: [phone] Office: [phone] .

## 2015-11-22 NOTE — Progress Notes (Signed)
PT Cancellation Note  Patient Details Name: Austin Tran MRN: [id number] DOB: 05/15/1986   Cancelled Treatment:    Reason Eval/Treat Not Completed: PT screened, no needs identified, will sign off per OT Brynn, pt is functioning back at baseline and is independent with all mobility and does not require skilled therapy services. Screened and signed off from PT. Please re-consult if anything changes.   Austin Tran 11/22/2015, 10:51 AM Mylo Red, PT, DPT [phone]

## 2015-11-22 NOTE — Progress Notes (Addendum)
PROGRESS NOTE  Austin Tran  ZOX:[id number] DOB: [birth year]  DOA: 11/20/2015 PCP: Haywood Pao, MD   Brief Narrative:  30 year old male with a PMH of ADD, alcohol use/? Abuse, and no other significant PMH, presented to the [facility] ED on 11/20/15 with speech difficulty/expressive aphasia that started on 6/28 at 12:30 PM. CT head without acute findings. MRI brain showed acute CVA. TPA not given because outside window. Transfer to [facility] for further evaluation by neurology/stroke service. Cardiology consulted for new cardiomyopathy.  Assessment & Plan:   Active Problems:   CVA (cerebral infarction)   Congestive dilated cardiomyopathy (HCC)   CVA: Acute cortical infarct posterior left temporal lobe - Resultant expressive aphasia-now resolved. - Etiology not clear but being worked up for embolic source. - CT head 6/29: No acute findings. - MRI brain 6/29: Acute ischemic cortical infarct involving the posterior left temporal lobe. No hemorrhage or mass effect. - 2-D echo 11/21/15: LVEF 20-25 percent, severely dilated with global hypokinesis and normal wall thickness. -TEE 11/22/15: Severe global reduction in LV function, severe RV dysfunction, negative saline microcavitation study. - Carotid Dopplers 6/29: Bilateral no significant ICA stenosis. Antegrade vertebral flow. - LDL 128 - ESR: 1. Antithrombin III activity: 98 (within normal limits). INR normal at 1.18. - ANA negative.  - UDS positive for amphetamines (on prescription). Blood alcohol level <5. - Not on antithrombotic started prior to admission. Now on aspirin 325 MG daily for secondary stroke prophylaxis. - PT and OT: No needs identified. - As per cardiology, consider ILR  Dilated cardiomyopathy - May be due to viral myocarditis +/- alcohol use. - 2-D echo and TEE results as above. - Cardiology follow-up appreciated. Recommending starting ACEI and beta blockers. Also per cardiology, may be worthwhile to follow-up  cardiac MRI to assess myocardium but MRI down. - Discussed with Dr. Johnsie Cancel, who recommends starting lisinopril 5 MG daily and carvedilol 3.125 MG twice a day, monitor overnight prior to to see how he tolerates this prior to considering discharge. He will arrange outpatient follow-up with CHF team and will discuss with stroke M.D. regarding need for anticoagulation.  Hyperlipidemia - LDL 128, goal <70. Simvastatin 40 mg daily started.  ADD  - As per cardiology, hold Adderall for now and consider changing to non-stimulant agent like Strattera.  - And when necessary oral Ativan for anxiety.   Alcohol use/? Abuse - Abstinence counseled. CIWA protocol.  Discussed with Dr. Osborne Casco, PCP. Updated care. He suggested stopping Adderall for now until OP follow up with him in office.  DVT prophylaxis: Lovenox Code Status: Full Family Communication: Discussed with patient's girlfriend at bedside.  Disposition Plan: DC home when medically stable.   Consultants:   Neurology  Cardiology  Procedures:   TEE 6/30  2 D Echo 11/21/2015: Study Conclusions  - Left ventricle: The cavity size was severely dilated. Wall  thickness was normal. Systolic function was severely reduced. The  estimated ejection fraction was in the range of 20% to 25%.  Diffuse hypokinesis. The study is not technically sufficient to  allow evaluation of LV diastolic function. - Mitral valve: Mildly thickened leaflets . There was mild  regurgitation. - Left atrium: Moderately dilated. - Right ventricle: The cavity size was moderately dilated. The  moderator band was prominent. Systolic function is reduced. - Tricuspid valve: There was no significant regurgitation. - Inferior vena cava: The vessel was dilated. The respirophasic  diameter changes were blunted (< 50%), consistent with elevated  central venous pressure.  Impressions:  -  LVEF of 20-25%, severely dilated wtih global hypokinesis and  normal wall  thickness. Mild MR, moderate LAE, moderate RVE with  reduced RV function, dilated IVC.  Antimicrobials:   None   Subjective: States that his speech has significantly improved and as per girlfriend at bedside seems to have normalized. No other strokelike symptoms. States that he feels anxious.   Objective:  Filed Vitals:   11/22/15 1235 11/22/15 1247 11/22/15 1250 11/22/15 1257  BP: 125/84 119/87 126/90 121/94  Pulse: 110 102 100 97  Temp:      TempSrc:      Resp: '23 31 17 21  '$ Height:      Weight:      SpO2: 96% 92% 95% 94%   No intake or output data in the 24 hours ending 11/22/15 1333 Filed Weights   11/20/15 2205 11/22/15 1134  Weight: 89.359 kg (197 lb) 89.359 kg (197 lb)    Examination:  General exam: Pleasant young male sitting up comfortably in bed. Girlfriend at bedside.  Respiratory system: Clear to auscultation. Respiratory effort normal. Cardiovascular system: S1 & S2 heard, RRR. No JVD, murmurs, rubs, gallops or clicks. No pedal edema. Telemetry: Sinus tachycardia in the 100s.  Gastrointestinal system: Abdomen is nondistended, soft and nontender. No organomegaly or masses felt. Normal bowel sounds heard. Central nervous system: Alert and oriented. No focal neurological deficits. Extremities: Symmetric 5 x 5 power. Skin: No rashes, lesions or ulcers Psychiatry: Judgement and insight appear normal. Mood & affec-appears slightly anxious.     Data Reviewed: I have personally reviewed following labs and imaging studies  CBC:  Recent Labs Lab 11/21/15 0045  WBC 10.5  NEUTROABS 6.6  HGB 15.8  HCT 44.5  MCV 87.1  PLT 809   Basic Metabolic Panel:  Recent Labs Lab 11/21/15 0045  NA 137  K 4.0  CL 106  CO2 20*  GLUCOSE 98  BUN 17  CREATININE 0.97  CALCIUM 9.1   GFR: Estimated Creatinine Clearance: 124.2 mL/min (by C-G formula based on Cr of 0.97). Liver Function Tests:  Recent Labs Lab 11/21/15 0045  AST 24  ALT 26  ALKPHOS 68  BILITOT  1.1  PROT 7.0  ALBUMIN 4.1   No results for input(s): LIPASE, AMYLASE in the last 168 hours. No results for input(s): AMMONIA in the last 168 hours. Coagulation Profile:  Recent Labs Lab 11/21/15 0045  INR 1.18   Cardiac Enzymes: No results for input(s): CKTOTAL, CKMB, CKMBINDEX, TROPONINI in the last 168 hours. BNP (last 3 results) No results for input(s): PROBNP in the last 8760 hours. HbA1C: No results for input(s): HGBA1C in the last 72 hours. CBG: No results for input(s): GLUCAP in the last 168 hours. Lipid Profile:  Recent Labs  11/22/15 0816  CHOL 196  HDL 48  LDLCALC 128*  TRIG 102  CHOLHDL 4.1   Thyroid Function Tests: No results for input(s): TSH, T4TOTAL, FREET4, T3FREE, THYROIDAB in the last 72 hours. Anemia Panel: No results for input(s): VITAMINB12, FOLATE, FERRITIN, TIBC, IRON, RETICCTPCT in the last 72 hours.  Sepsis Labs: No results for input(s): PROCALCITON, LATICACIDVEN in the last 168 hours.  No results found for this or any previous visit (from the past 240 hour(s)).       Radiology Studies: Ct Head Wo Contrast  11/21/2015  CLINICAL DATA:  30 year old male with a lesion. EXAM: CT HEAD WITHOUT CONTRAST TECHNIQUE: Contiguous axial images were obtained from the base of the skull through the vertex without intravenous contrast. COMPARISON:  Head CT dated 10/15/2002 FINDINGS: The ventricles and the sulci are appropriate in size for the patient's age. There is no intracranial hemorrhage. No midline shift or mass effect identified. The gray-white matter differentiation is preserved. 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 The visualized paranasal sinuses and mastoid air cells are well aerated. The calvarium is intact. IMPRESSION: No acute intracranial pathology. Electronically Signed   By: Anner Crete M.D.   On: 11/21/2015 00:00   Mr Brain Wo Contrast  11/21/2015   CLINICAL DATA:  Initial evaluation for acute onset expressive aphasia. EXAM: MRI HEAD WITHOUT CONTRAST TECHNIQUE: Multiplanar, multiecho pulse sequences of the brain and surrounding structures were obtained without intravenous contrast. COMPARISON:  Prior CT from 11/20/2015. FINDINGS: Cerebral volume within normal limits for patient age. No significant white matter disease present. There is abnormal gyriform restricted diffusion involving the cortical gray matter of the posterior left temporal lobe, suspicious for acute ischemic infarct. Associated T2/FLAIR abnormality without significant mass effect. No associated hemorrhage. No other abnormal foci of restricted diffusion. Gray-white matter differentiation otherwise maintained. Major intracranial vascular flow voids are preserved. No other areas of chronic infarction. No acute or chronic intracranial hemorrhage. No mass lesion, midline shift, or mass effect. No hydrocephalus. 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 No other extra-axial fluid collection. Major dural sinuses are grossly patent. Craniocervical junction within normal limits. Visualized upper cervical spine unremarkable. Pituitary gland within normal limits. No acute abnormality about the globes and orbits. Mild scattered mucosal thickening within the ethmoidal air cells and maxillary sinuses. Paranasal sinuses are otherwise clear. No mastoid effusion. Inner ear structures grossly normal. Bone marrow signal intensity within normal limits. No scalp soft tissue abnormality. IMPRESSION: 1. 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 No associated hemorrhage or mass effect. 2. Otherwise negative brain MRI. Electronically Signed   By: Jeannine Boga M.D.   On: 11/21/2015 07:29        Scheduled Meds: . aspirin  300 mg Rectal Daily   Or  . aspirin  325 mg Oral Daily  . enoxaparin (LOVENOX) injection  40 mg Subcutaneous Q24H  .  simvastatin  40 mg Oral q1800   Continuous Infusions:       Time spent: 40 minutes.    [facility], MD [facility] Pager [phone]  If 7PM-7AM, please contact night-coverage [email or web address] Password [email or web address] 11/22/2015, 1:33 PM

## 2015-11-22 NOTE — Progress Notes (Signed)
Speech Language Pathology Treatment: Cognitive-Linquistic  Patient Details Name: Austin Tran MRN: [id number] DOB: [birth year] Today's Date: 11/22/2015 Time: 9532-0233 SLP Time Calculation (min) (ACUTE ONLY): 41 min  Assessment / Plan / Recommendation Clinical Impression  Pt seen for f/u aphasia tx with mild improvements noted today as compared to initial evaluation. Mod cues provided for divergent naming task for topic maintenance, although pt was producing more words spontaneously during structured task with extra time. Pt and his family were educated on the importance of OP SLP f/u to maximize functional recovery and independence. MD paged regarding the need for OP SLP services as well. Reviewed tasks that pt can participate in at home to work toward recovery. All questions answered at this time.    HPI HPI: 30 y.o. male with acute left temporal CVA.       SLP Plan  Continue with current plan of care     Recommendations                Follow up Recommendations: Outpatient SLP;24 hour supervision/assistance Plan: Continue with current plan of care     GO               Maxcine Ham, M.A. CCC-SLP [phone]  Maxcine Ham 11/22/2015, 4:35 PM

## 2015-11-22 NOTE — Consult Note (Signed)
CARDIOLOGY CONSULT NOTE       Patient ID: Austin Tran MRN: [id number] DOB/AGE: 08-17-1985 30 y.o.  Admit date: 11/20/2015 Referring Physician:  Waymon Amato Primary Physician: Garth Schlatter, MD Primary Cardiologist:  New/Rolando Whitby Reason for Consultation:  Low EF / CVA   Active Problems:   CVA (cerebral infarction)   HPI:  30 y.o. admitted with CVA.  Left posterior temporal lobe. Noted issues finding words on W[place].  On adderall since 2004 for ADD.  No other meds. No previous Cardiac issues. No family history of premature stroke, hypercoagulability or congenital heart dx.  Speech is improved today. No palpitations chest pain or dyspnea. Had Bad virus for 2 months with coughing finally cleared in June.  No fevers.  Telemetry with no arrhythmia or PAF.  Echo from yesterday reviewed.  Study Conclusions  - Left ventricle: The cavity size was severely dilated. Wall  thickness was normal. Systolic function was severely reduced. The  estimated ejection fraction was in the range of 20% to 25%.  Diffuse hypokinesis. The study is not technically sufficient to  allow evaluation of LV diastolic function. - Mitral valve: Mildly thickened leaflets . There was mild  regurgitation. - Left atrium: Moderately dilated. - Right ventricle: The cavity size was moderately dilated. The  moderator band was prominent. Systolic function is reduced. - Tricuspid valve: There was no significant regurgitation. - Inferior vena cava: The vessel was dilated. The respirophasic  diameter changes were blunted (< 50%), consistent with elevated  central venous pressure.  Impressions:  - LVEF of 20-25%, severely dilated wtih global hypokinesis and  normal wall thickness. Mild MR, moderate LAE, moderate RVE with  reduced RV function, dilated IVC.   ROS All other systems reviewed and negative except as noted above  Past Medical History  Diagnosis Date  . ADHD (attention deficit  hyperactivity disorder)     on adderall since 2004    Family History  Problem Relation Age of Onset  . Hypertension Mother   . Hypertension Father   . Cancer Maternal Grandmother     Social History   Social History  . Marital Status: Single    Spouse Name: N/A  . Number of Children: N/A  . Years of Education: N/A   Occupational History  . Not on file.   Social History Main Topics  . Smoking status: Current Some Day Smoker    Types: Cigarettes  . Smokeless tobacco: Not on file  . Alcohol Use: 7.2 oz/week    12 Cans of beer per week     Comment: drink 4-5 beers a night, more on the weekend  . Drug Use: No  . Sexual Activity: Not on file   Other Topics Concern  . Not on file   Social History Narrative    Past Surgical History  Procedure Laterality Date  . Knee surgery    . Eye surgery       . aspirin  300 mg Rectal Daily   Or  . aspirin  325 mg Oral Daily  . enoxaparin (LOVENOX) injection  40 mg Subcutaneous Q24H      Physical Exam: Blood pressure 123/93, pulse 109, temperature 98.1 F (36.7 C), temperature source Oral, resp. rate 20, height  (1.778 m), weight 197 lb (89.359 kg), SpO2 98 %.   Affect appropriate Healthy:  appears stated age HEENT: normal Neck supple with no adenopathy JVP normal no bruits no thyromegaly Lungs clear with no wheezing and good diaphragmatic motion Heart:  S1/S2  no murmur, no rub, gallop or click PMI  Enlarged  Abdomen: benighn, BS positve, no tenderness, no AAA no bruit.  No HSM or HJR Distal pulses intact with no bruits No edema Neuro non-focal mild word finding problems Skin warm and dry No muscular weakness   Labs:   Lab Results  Component Value Date   WBC 10.5 11/21/2015   HGB 15.8 11/21/2015   HCT 44.5 11/21/2015   MCV 87.1 11/21/2015   PLT 257 11/21/2015    Recent Labs Lab 11/21/15 0045  NA 137  K 4.0  CL 106  CO2 20*  BUN 17  CREATININE 0.97  CALCIUM 9.1  PROT 7.0  BILITOT 1.1  ALKPHOS  68  ALT 26  AST 24  GLUCOSE 98      Radiology: Ct Head Wo Contrast  11/21/2015  CLINICAL DATA:  30 year old male with a lesion. EXAM: CT HEAD WITHOUT CONTRAST TECHNIQUE: Contiguous axial images were obtained from the base of the skull through the vertex without intravenous contrast. COMPARISON:  Head CT dated 10/15/2002 FINDINGS: The ventricles and the sulci are appropriate in size for the patient's age. There is no intracranial hemorrhage. No midline shift or mass effect identified. The gray-white matter differentiation is preserved. 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 The visualized paranasal sinuses and mastoid air cells are well aerated. The calvarium is intact. IMPRESSION: No acute intracranial pathology. Electronically Signed   By: Elgie Collard M.D.   On: 11/21/2015 00:00   Mr Brain Wo Contrast  11/21/2015  CLINICAL DATA:  Initial evaluation for acute onset expressive aphasia. EXAM: MRI HEAD WITHOUT CONTRAST TECHNIQUE: Multiplanar, multiecho pulse sequences of the brain and surrounding structures were obtained without intravenous contrast. COMPARISON:  Prior CT from 11/20/2015. FINDINGS: Cerebral volume within normal limits for patient age. No significant white matter disease present. There is abnormal gyriform restricted diffusion involving the cortical gray matter of the posterior left temporal lobe, suspicious for acute ischemic infarct. Associated T2/FLAIR abnormality without significant mass effect. No associated hemorrhage. No other abnormal foci of restricted diffusion. Gray-white matter differentiation otherwise maintained. Major intracranial vascular flow voids are preserved. No other areas of chronic infarction. No acute or chronic intracranial hemorrhage. No mass lesion, midline shift, or mass effect. No hydrocephalus. 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 No other  extra-axial fluid collection. Major dural sinuses are grossly patent. Craniocervical junction within normal limits. Visualized upper cervical spine unremarkable. Pituitary gland within normal limits. No acute abnormality about the globes and orbits. Mild scattered mucosal thickening within the ethmoidal air cells and maxillary sinuses. Paranasal sinuses are otherwise clear. No mastoid effusion. Inner ear structures grossly normal. Bone marrow signal intensity within normal limits. No scalp soft tissue abnormality. IMPRESSION: 1. 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 No associated hemorrhage or mass effect. 2. Otherwise negative brain MRI. Electronically Signed   By: Rise Mu M.D.   On: 11/21/2015 07:29    EKG:  ST rate 109   ASSESSMENT AND PLAN:  CVA:  Possibly embolic from heart with DCM.  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  Start ACE and beta blocker MRI scanner down but may be worthwhile To f/u to assess myocardium ADD:  Hold adderall for now consider changing to non stimulant agent like stratterra  Signed: Charlton Haws 11/22/2015, 9:41 AM

## 2015-11-22 NOTE — Consult Note (Signed)
CARDIOLOGY CONSULT NOTE   Patient ID: AYIDEN CASILLAS MRN: [id number], DOB/AGE: 26-Feb-1986   Admit date: 11/20/2015 Date of Consult: 11/22/2015   Primary Physician: Garth Schlatter, MD Primary Cardiologist: new   Pt. Profile  The patient is a young 30 year old male with past medical history of ADHD on Adderall since 2004, however no other cardiac history or past medical history presented with acute CVA and found to have EF 25%  Problem List  Past Medical History  Diagnosis Date  . ADHD (attention deficit hyperactivity disorder)     on adderall since 2004    Past Surgical History  Procedure Laterality Date  . Knee surgery    . Eye surgery       Allergies  No Known Allergies  HPI   The patient is a young 30 year old male with past medical history of ADHD on Adderall since 2004, however no other cardiac history or past medical history. He denies any significant family history of CAD. He does drink heavily roughly 4-5 beers per night and more on the weekend. He denies ever having chest pain. He works in [employer] and on his feet all day long. He has never experienced any exertional symptom except in the last 3 days. He had a prolonged episode of cold including productive cough from March extending into April and he lost his voice during that time. He eventually recovered. Since Father's Day, he has had 2-3 episodes of what he described as "panic attack". He would wake up in the morning feeling his heart was racing. The day prior to his hospitalization, he was climbing a flight of stairs and feels short of breath which is unusual for him. He went to sleep feeling fine. Next morning, on 11/20/2015 he woke up with acute confusion and difficulty forming words and sentences. He went to urgent care and was eventually transferred to [facility] for further evaluation.  Initial laboratory finding was normal. Troponin 0.04. Urinalysis was negative for nitrite. Urine drug screen was  positive for amphetamine, patient was noted to be on Adderall. Alcohol level was less than 5. MRI of the head however shows acute ischemic cortical infarct involving the posterior left temporal lobe, no associated hemorrhage or mass effect. Cardiology was consulted for TEE and the patient was consented for the procedure for today. However echocardiogram obtained on 11/21/2015 showed EF 20-25%, severe dilatation of LV, mild MR, moderate left atrial enlargement, moderate RV enlargement with reduced RV function. Carotid ultrasound was negative. Cardiology has been formally consulted for decreased LVEF.   Inpatient Medications  . aspirin  300 mg Rectal Daily   Or  . aspirin  325 mg Oral Daily  . enoxaparin (LOVENOX) injection  40 mg Subcutaneous Q24H    Family History Family History  Problem Relation Age of Onset  . Hypertension Mother   . Hypertension Father   . Cancer Maternal Grandmother      Social History Social History   Social History  . Marital Status: Single    Spouse Name: N/A  . Number of Children: N/A  . Years of Education: N/A   Occupational History  . Not on file.   Social History Main Topics  . Smoking status: Current Some Day Smoker    Types: Cigarettes  . Smokeless tobacco: Not on file  . Alcohol Use: 7.2 oz/week    12 Cans of beer per week     Comment: drink 4-5 beers a night, more on the weekend  . Drug  Use: No  . Sexual Activity: Not on file   Other Topics Concern  . Not on file   Social History Narrative     Review of Systems  General:  No chills, fever, night sweats or weight changes.  Cardiovascular:  No chest pain, edema, orthopnea, palpitations, paroxysmal nocturnal dyspnea. +dyspnea on exertion Dermatological: No rash, lesions/masses Respiratory: No cough, dyspnea Urologic: No hematuria, dysuria Abdominal:   No nausea, vomiting, diarrhea, bright red blood per rectum, melena, or hematemesis Neurologic:  No visual changes, wkns +changes in  mental status. All other systems reviewed and are otherwise negative except as noted above.  Physical Exam  Blood pressure 105/70, pulse 102, temperature 98.6 F (37 C), temperature source Oral, resp. rate 18, height  (1.778 m), weight 197 lb (89.359 kg), SpO2 97 %.  General: Pleasant, NAD Psych: Normal affect. Neuro: Alert and oriented X 3. Moves all extremities spontaneously. HEENT: Normal  Neck: Supple without bruits or JVD. Lungs:  Resp regular and unlabored, CTA. Heart: RRR no s3, s4, or murmurs. Abdomen: Soft, non-tender, non-distended, BS + x 4.  Extremities: No clubbing, cyanosis or edema. DP/PT/Radials 2+ and equal bilaterally.  Labs  No results for input(s): CKTOTAL, CKMB, TROPONINI in the last 72 hours. Lab Results  Component Value Date   WBC 10.5 11/21/2015   HGB 15.8 11/21/2015   HCT 44.5 11/21/2015   MCV 87.1 11/21/2015   PLT 257 11/21/2015     Recent Labs Lab 11/21/15 0045  NA 137  K 4.0  CL 106  CO2 20*  BUN 17  CREATININE 0.97  CALCIUM 9.1  PROT 7.0  BILITOT 1.1  ALKPHOS 68  ALT 26  AST 24  GLUCOSE 98   No results found for: CHOL, HDL, LDLCALC, TRIG No results found for: DDIMER  Radiology/Studies  Ct Head Wo Contrast  11/21/2015  CLINICAL DATA:  30 year old male with a lesion. EXAM: CT HEAD WITHOUT CONTRAST TECHNIQUE: Contiguous axial images were obtained from the base of the skull through the vertex without intravenous contrast. COMPARISON:  Head CT dated 10/15/2002 FINDINGS: The ventricles and the sulci are appropriate in size for the patient's age. There is no intracranial hemorrhage. No midline shift or mass effect identified. The gray-white matter differentiation is preserved. 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 The visualized paranasal sinuses and mastoid air cells are well aerated. The calvarium is intact. IMPRESSION: No acute intracranial pathology.  Electronically Signed   By: Elgie Collard M.D.   On: 11/21/2015 00:00   Mr Brain Wo Contrast  11/21/2015  CLINICAL DATA:  Initial evaluation for acute onset expressive aphasia. EXAM: MRI HEAD WITHOUT CONTRAST TECHNIQUE: Multiplanar, multiecho pulse sequences of the brain and surrounding structures were obtained without intravenous contrast. COMPARISON:  Prior CT from 11/20/2015. FINDINGS: Cerebral volume within normal limits for patient age. No significant white matter disease present. There is abnormal gyriform restricted diffusion involving the cortical gray matter of the posterior left temporal lobe, suspicious for acute ischemic infarct. Associated T2/FLAIR abnormality without significant mass effect. No associated hemorrhage. No other abnormal foci of restricted diffusion. Gray-white matter differentiation otherwise maintained. Major intracranial vascular flow voids are preserved. No other areas of chronic infarction. No acute or chronic intracranial hemorrhage. No mass lesion, midline shift, or mass effect. No hydrocephalus. 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 No other extra-axial fluid collection. Major dural sinuses are grossly patent. Craniocervical junction within normal  limits. Visualized upper cervical spine unremarkable. Pituitary gland within normal limits. No acute abnormality about the globes and orbits. Mild scattered mucosal thickening within the ethmoidal air cells and maxillary sinuses. Paranasal sinuses are otherwise clear. No mastoid effusion. Inner ear structures grossly normal. Bone marrow signal intensity within normal limits. No scalp soft tissue abnormality. IMPRESSION: 1. 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 No associated hemorrhage or mass effect. 2. Otherwise negative brain MRI. Electronically Signed   By: Rise Mu M.D.   On: 11/21/2015 07:29    ECG  Sinus tach without obvious ST-T wave  changes.  ASSESSMENT AND PLAN  1. New LV dysfunction  - unclear etiology, potential possibilities include stress induced cardiomyopathy, tachymediated cardiomyopathy secondary to chronic Adderall use, post viral cardiomyopathy, EtOH induced vs ischemic cardiomyopathy. Given his age, ischemic cardiomyopathy is unlikely.  - will discuss with MD, once over acute stroke phase, may add coreg and lisinopril for LV dysfunction, repeat echo in several weeks, if stress induced cardiomyopathy, expect improvement within 2 weeks of stressor.   - May also consider myoview as well, this can be obtained either as outpatient or inpatient.   2. Acute posterior temporal lobe cortical CVA  - unclear cause, not sure of CVA occurred before cardiomyopathy (in case of takotsubo) vs after cardiomyopathy (in case of mural emboli)  - pending TEE today  3. ADHD on Adderall since 2004  4. EtOH abuse: he drinks 4-5 beers daily and more on the weekend. Need to cut back, esp given LV dysfunction  Signed, Azalee Course, PA-C 11/22/2015, 8:18 AM  See my separate consult note  Charlton Haws

## 2015-11-23 DIAGNOSIS — R4701 Aphasia: Secondary | ICD-10-CM

## 2015-11-23 LAB — VAS US CAROTID
LCCADSYS: -81 cm/s
LEFT ECA DIAS: -18 cm/s
LEFT VERTEBRAL DIAS: 12 cm/s
LICAPDIAS: -20 cm/s
Left CCA dist dias: -25 cm/s
Left CCA prox dias: 20 cm/s
Left CCA prox sys: 107 cm/s
Left ICA dist dias: -19 cm/s
Left ICA dist sys: -55 cm/s
Left ICA prox sys: -51 cm/s
RCCAPDIAS: 21 cm/s
RIGHT ECA DIAS: -24 cm/s
RIGHT VERTEBRAL DIAS: 11 cm/s
Right CCA prox sys: 84 cm/s
Right cca dist sys: -52 cm/s

## 2015-11-23 LAB — HEMOGLOBIN A1C
HEMOGLOBIN A1C: 5.3 % (ref 4.8–5.6)
MEAN PLASMA GLUCOSE: 105 mg/dL

## 2015-11-23 MED ORDER — APIXABAN 5 MG PO TABS
5.0000 mg | ORAL_TABLET | Freq: Two times a day (BID) | ORAL | Status: DC
  Administered 2015-11-23: 5 mg via ORAL
  Filled 2015-11-23: qty 1

## 2015-11-23 MED ORDER — ADULT MULTIVITAMIN W/MINERALS CH
1.0000 | ORAL_TABLET | Freq: Every day | ORAL | Status: DC
Start: 2015-11-23 — End: 2018-06-01

## 2015-11-23 MED ORDER — THIAMINE HCL 100 MG PO TABS: 100.0000 mg | ORAL_TABLET | Freq: Every day | ORAL | Status: DC

## 2015-11-23 MED ORDER — FOLIC ACID 1 MG PO TABS: 1.0000 mg | ORAL_TABLET | Freq: Every day | ORAL | Status: DC

## 2015-11-23 MED ORDER — ATORVASTATIN CALCIUM 20 MG PO TABS: 20.0000 mg | ORAL_TABLET | Freq: Every day | ORAL | Status: DC

## 2015-11-23 MED ORDER — LISINOPRIL 5 MG PO TABS: 5.0000 mg | ORAL_TABLET | Freq: Every day | ORAL | Status: DC

## 2015-11-23 MED ORDER — CARVEDILOL 6.25 MG PO TABS: 6.2500 mg | ORAL_TABLET | Freq: Two times a day (BID) | ORAL | Status: DC

## 2015-11-23 MED ORDER — AMPHETAMINE-DEXTROAMPHETAMINE 30 MG PO TABS: 30.0000 mg | ORAL_TABLET | Freq: Every day | ORAL | Status: DC

## 2015-11-23 MED ORDER — APIXABAN 5 MG PO TABS
5.0000 mg | ORAL_TABLET | Freq: Two times a day (BID) | ORAL | Status: DC
Start: 2015-11-23 — End: 2015-11-25

## 2015-11-23 MED ORDER — AMPHETAMINE-DEXTROAMPHETAMINE 10 MG PO TABS
30.0000 mg | ORAL_TABLET | Freq: Every day | ORAL | Status: DC
  Administered 2015-11-23: 30 mg via ORAL
  Filled 2015-11-23: qty 3

## 2015-11-23 NOTE — Progress Notes (Signed)
Called to the room by patient. Patient complaining of pain at the center of the chest that does not radiates. Patient also said he felt short of breath prior to this. Vital signs within normal range.

## 2015-11-23 NOTE — Progress Notes (Signed)
STROKE TEAM PROGRESS NOTE   HISTORY OF PRESENT ILLNESS (per record) Austin Tran is an 30 y.o. male who presented to the Emergency Department complaining of a gradually improving episode of moderate expressive aphasia (difficulty expressing words and forming sentences) that was first noticed at 12:30 pm yesterday 11/20/2015 ([facility]) after awakening from a nap. There are no specific mitigating or exacerbating factors. Per chart he reported associated, vaguely described, bilateral visual disturbance with trouble reading that lasted for 15 minutes. He forced himself to vomit once thinking he might have been poisoned. Currently he is feeling almost back to baseline. Girlfriend did not note any seizure activity. No known history of clotting deficiency in family. Patient was not administered IV t-PA secondary to minimal symptoms. He was admitted  for further evaluation and treatment.   SUBJECTIVE (INTERVAL HISTORY) His parents are at the bedside.  He reports  Mild upset stomach after taking lisinopril but is feeling better now. I will discuss the case with Dr. Jens Som who feels patient needs to be on short-term anticoagulation with eliquis ~follow-up echocardiogram is done to see if cardiac ejection fraction improves. I discussed the risk-benefit of this bleeding risk with the patient and family and answered questions.  OBJECTIVE Temp:  [97.5 F (36.4 C)-98.8 F (37.1 C)] 98.2 F (36.8 C) (07/01 0909) Pulse Rate:  [98-111] 98 (07/01 1028) Cardiac Rhythm:  [-] Normal sinus rhythm (07/01 0700) Resp:  [18-20] 18 (07/01 0909) BP: (93-120)/(67-85) 102/74 mmHg (07/01 1028) SpO2:  [95 %-99 %] 99 % (07/01 1028)  CBC:   Recent Labs Lab 11/21/15 0045  WBC 10.5  NEUTROABS 6.6  HGB 15.8  HCT 44.5  MCV 87.1  PLT 257    Basic Metabolic Panel:   Recent Labs Lab 11/21/15 0045  NA 137  K 4.0  CL 106  CO2 20*  GLUCOSE 98  BUN 17  CREATININE 0.97  CALCIUM 9.1    Lipid Panel:     Component  Value Date/Time   CHOL 196 11/22/2015 0816   TRIG 102 11/22/2015 0816   HDL 48 11/22/2015 0816   CHOLHDL 4.1 11/22/2015 0816   VLDL 20 11/22/2015 0816   LDLCALC 128* 11/22/2015 0816   HgbA1c:  Lab Results  Component Value Date   HGBA1C 5.3 11/22/2015   Urine Drug Screen:     Component Value Date/Time   LABOPIA NONE DETECTED 11/21/2015 0047   COCAINSCRNUR NONE DETECTED 11/21/2015 0047   LABBENZ NONE DETECTED 11/21/2015 0047   AMPHETMU POSITIVE* 11/21/2015 0047   THCU NONE DETECTED 11/21/2015 0047   LABBARB NONE DETECTED 11/21/2015 0047      IMAGING  Ct Angio Head W Or Wo Contrast  11/22/2015  CLINICAL DATA:  Expressive aphasia. Posterior left temporal lobe infarct on MRI. Cardiomyopathy, possibly post viral. EXAM: CT ANGIOGRAPHY HEAD AND NECK TECHNIQUE: Multidetector CT imaging of the head and neck was performed using the standard protocol during bolus administration of intravenous contrast. Multiplanar CT image reconstructions and MIPs were obtained to evaluate the vascular anatomy. Carotid stenosis measurements (when applicable) are obtained utilizing NASCET criteria, using the distal internal carotid diameter as the denominator. CONTRAST:  50 mL Isovue 370 COMPARISON:  Head MRI 11/21/2015 and CT 11/20/2015 FINDINGS: CTA NECK Aortic arch: 3 vessel aortic arch. Brachiocephalic and subclavian arteries are widely patent. Right carotid system: Widely patent without evidence of stenosis, dissection, or aneurysm. Mildly tortuous distal cervical ICA. Left carotid system: Widely patent without evidence of stenosis, dissection, or aneurysm. Vertebral arteries: Widely patent with  the left being mildly dominant. Skeleton: Unremarkable. Other neck: Borderline enlarged mediastinal lymph nodes, with the largest measuring 10 mm in short axis in the upper right paratracheal station, nonspecific but likely reactive. No neck mass. CTA HEAD Anterior circulation: Internal carotid arteries are widely patent  from skullbase to carotid termini. ACAs and MCAs are patent without evidence of major branch occlusion or significant proximal stenosis. Branch vessel irregularity diffusely is favored to be artifactual. No intracranial aneurysm is identified. Posterior circulation: The intracranial vertebral arteries are patent to the basilar with the distal right vertebral artery being hypoplastic. PICA and SCA origins are patent. Basilar artery is patent without stenosis. Posterior communicating arteries are not identified. PCAs are patent without evidence of significant proximal stenosis. PCA branch vessel irregularity and mild attenuation are favored to be artifactual. Venous sinuses: Patent. Anatomic variants: None of significance. Delayed phase: No abnormal enhancement. Mild cortical hypoattenuation in the posterior left temporal lobe corresponding to the previously demonstrated infarct. IMPRESSION: No evidence of major arterial branch occlusion or significant cervical or proximal intracranial stenosis. Electronically Signed   By: Sebastian Ache M.D.   On: 11/22/2015 17:04   Ct Angio Neck W Or Wo Contrast  11/22/2015  CLINICAL DATA:  Expressive aphasia. Posterior left temporal lobe infarct on MRI. Cardiomyopathy, possibly post viral. EXAM: CT ANGIOGRAPHY HEAD AND NECK TECHNIQUE: Multidetector CT imaging of the head and neck was performed using the standard protocol during bolus administration of intravenous contrast. Multiplanar CT image reconstructions and MIPs were obtained to evaluate the vascular anatomy. Carotid stenosis measurements (when applicable) are obtained utilizing NASCET criteria, using the distal internal carotid diameter as the denominator. CONTRAST:  50 mL Isovue 370 COMPARISON:  Head MRI 11/21/2015 and CT 11/20/2015 FINDINGS: CTA NECK Aortic arch: 3 vessel aortic arch. Brachiocephalic and subclavian arteries are widely patent. Right carotid system: Widely patent without evidence of stenosis, dissection, or  aneurysm. Mildly tortuous distal cervical ICA. Left carotid system: Widely patent without evidence of stenosis, dissection, or aneurysm. Vertebral arteries: Widely patent with the left being mildly dominant. Skeleton: Unremarkable. Other neck: Borderline enlarged mediastinal lymph nodes, with the largest measuring 10 mm in short axis in the upper right paratracheal station, nonspecific but likely reactive. No neck mass. CTA HEAD Anterior circulation: Internal carotid arteries are widely patent from skullbase to carotid termini. ACAs and MCAs are patent without evidence of major branch occlusion or significant proximal stenosis. Branch vessel irregularity diffusely is favored to be artifactual. No intracranial aneurysm is identified. Posterior circulation: The intracranial vertebral arteries are patent to the basilar with the distal right vertebral artery being hypoplastic. PICA and SCA origins are patent. Basilar artery is patent without stenosis. Posterior communicating arteries are not identified. PCAs are patent without evidence of significant proximal stenosis. PCA branch vessel irregularity and mild attenuation are favored to be artifactual. Venous sinuses: Patent. Anatomic variants: None of significance. Delayed phase: No abnormal enhancement. Mild cortical hypoattenuation in the posterior left temporal lobe corresponding to the previously demonstrated infarct. IMPRESSION: No evidence of major arterial branch occlusion or significant cervical or proximal intracranial stenosis. Electronically Signed   By: Sebastian Ache M.D.   On: 11/22/2015 17:04   2D Echocardiogram  - Left ventricle: The cavity size was severely dilated. Wall thickness was normal. Systolic function was severely reduced. The estimated ejection fraction was in the range of 20% to 25%. Diffuse hypokinesis. The study is not technically sufficient to allow evaluation of LV diastolic function. - Mitral valve: Mildly thickened leaflets .  There was  mild regurgitation. - Left atrium: Moderately dilated. - Right ventricle: The cavity size was moderately dilated. The moderator band was prominent. Systolic function is reduced. - Tricuspid valve: There was no significant regurgitation. - Inferior vena cava: The vessel was dilated. The respirophasic diameter changes were blunted (< 50%), consistent with elevated central venous pressure. Impressions:  LVEF of 20-25%, severely dilated wtih global hypokinesis and normal wall thickness. Mild MR, moderate LAE, moderate RVE with reduced RV function, dilated IVC.  TEE  Severe global reduction in LV function; severe RV dysfunction; negative saline microcavitation study.    PHYSICAL EXAM Pleasant young healthy Caucasian male not in distress. . Afebrile. Head is nontraumatic. Neck is supple without bruit.    Cardiac exam no murmur or gallop. Lungs are clear to auscultation. Distal pulses are well felt. Neurological Exam :  Awake  Alert oriented x 3. Normal speech and language.eye movements full without nystagmus.fundi were not visualized. Vision acuity and fields appear normal. Hearing is normal. Palatal movements are normal. Face symmetric. Tongue midline. Normal strength, tone, reflexes and coordination. Normal sensation. Gait deferred. ASSESSMENT/PLAN Austin Tran is a 30 y.o. male with history of ADD and recent viral illness presenting with difficulty speaking for a few days. He did not receive IV t-PA due to delay in arrival.   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 No source of embolus   TEE no SOE, low EF, LV fxn  No indication for loop from stroke standpoint  LDL 128  HgbA1c 5.3  Hypercoagulable labs negative  Lovenox 40 mg sq daily for VTE prophylaxis  Diet regular Room service  appropriate?: Yes; Fluid consistency:: ThinNPO for TEE, ok to resume diet from stroke standpoint  No antithrombotic prior to admission, now on aspirin 325 mg daily  Patient counseled to be compliant with his antithrombotic medications  Ongoing aggressive stroke risk factor management  Therapy recommendations:  No therapy needs  Disposition:  Return home  Hyperlipidemia  Home meds:  No statin  LDL 128, goal < 70  Added statin lipitor 20 mg daily  Continue statin at discharge  Other Stroke Risk Factors  Cigarette smoker, advised to stop smoking  ETOH use, advised to drink no more than 2 drink(s) a day  Overweight, Body mass index is 28.27 kg/(m^2)., recommend weight loss, diet and exercise as appropriate   Other Active Problems  ADD - ok to resume Adderall from Stroke standpoint. Agreeable to consider non-stimulant replacement. Defer to medical team  Hospital day # 1  Austin Tran  [facility] See Amion for Pager information 11/23/2015 1:12 PM  I have personally examined this patient, reviewed notes, independently viewed imaging studies, participated in medical decision making and plan of care. I have made any additions or clarifications directly to the above note. Agree with note above. He presented with transient expressive aphasia secondary to a left MCA branch embolic infarct source to be determined.  . Recommend outpatient follow-up with cardiology and repeated echocardiogram and short term anticoagulation with Eliquis for stroke prevention. Follow-up with me as an outpatient in stroke clinic in 2 months or call earlier if necessary. Consider outpatient discussion with primary care physician to change Adderall to Strattera which is a non-stimulant I had a long discussion  with the patient and his parents and answered questions.d/w Dr Waymon Amato and Austin Tran. Greater than 50% of time during this 35 minute visit was spent on counseling and coordination of care about  stroke risk, prevention and treatment  Delia Heady, MD Medical Director [facility] Pager: [phone] 11/23/2015 1:12 PM    To contact Stroke Continuity provider, please refer to [email or web address]. After hours, contact General Neurology

## 2015-11-23 NOTE — Discharge Summary (Addendum)
Physician Discharge Summary  Austin Tran FBP:[id number] DOB: 06/07/1985  PCP: Haywood Pao, MD  Admit date: 11/20/2015 Discharge date: 11/23/2015  Admitted From: Home Disposition:  Home  Recommendations for Outpatient Follow-up:  1. Dr. Domenick Gong, PCP in 5 days. Recommend repeating labs (BMP) in 1-2 weeks. 2. Dr. Antony Contras, Neurology in 2 months. 3. Dr. Loralie Champagne, [facility] in 1 month. 4. Outpatient SLP follow-up.  Home Health: None Equipment/Devices: None    Discharge Condition: Improved and stable.  CODE STATUS: Full  Diet recommendation: Heart healthy diet.  Brief/Interim Summary: 30 year old male with a PMH of ADD, alcohol use/? Abuse, and no other significant PMH, presented to the [facility] ED on 11/20/15 with speech difficulty/expressive aphasia that started on 6/28 at 12:30 PM. CT head without acute findings. MRI brain showed acute CVA. TPA not given because outside window. Transferred to [facility] for further evaluation by neurology/stroke service. Cardiology consulted for new cardiomyopathy.  Discharge Diagnoses:  Active Problems:   CVA (cerebral infarction)   Congestive dilated cardiomyopathy (HCC)   Acute CVA (cerebrovascular accident) ([place])   Aphasia  CVA: Acute cortical infarct posterior left temporal lobe - Resultant expressive aphasia-now much improved and almost resolved. - Etiology: Suspect embolic infarct secondary to post viral cardiomyopathy with EF 20-25 percent. - CT head 6/29: No acute findings. - MRI brain 6/29: Acute ischemic cortical infarct involving the posterior left temporal lobe. No hemorrhage or mass effect. - 2-D echo 11/21/15: LVEF 20-25 percent, severely dilated with global hypokinesis and normal wall thickness. -TEE 11/22/15: Severe global reduction in LV function, severe RV dysfunction, negative saline microcavitation study. - Carotid Dopplers 6/29: Bilateral no significant ICA stenosis. Antegrade  vertebral flow. - CTA head and neck 11/22/15: No evidence of major arterial branch occlusion or significant cervical or proximal intracranial stenosis. - LDL 128. Started statins. - Hemoglobin A1c: 5.3. - ESR: 1. Antithrombin III activity: 98 (within normal limits). INR normal at 1.18. - ANA negative.  - UDS positive for amphetamines (on prescription). Blood alcohol level <5. - Not on antithrombotic started prior to admission. Now on eliquis 5 mg BID. - PT and OT: No needs identified. SLP recommends outpatient follow-up. - As per cardiology, consider ILR but neurology recommends no indication for loop from stroke standpoint. - Neurology follow-up appreciated: Patient presented with transient expressive aphasia secondary to a left MCA branch embolic infarct source to be determined. Recommend outpatient follow-up with cardiology and repeat echocardiogram and short-term anticoagulation with Eliquis for stroke prevention. Follow-up with stroke M.D. in 2 months. Consider outpatient discussion with primary care physician regarding changing Adderall to non-stimulant. Discussed with Dr. Leonie Man. - Discussed in detail with patient, his parents and girlfriend at bedside. Updated all care and answered questions. Counseled extensively regarding avoiding strenuous activity, contact sports or any situation that would put him at risk for bleeding while on full anticoagulation, abstinence from alcohol, compliance with medications and outpatient follow-up with MDs. They verbalized understanding.  Dilated cardiomyopathy - May be due to viral myocarditis +/- alcohol use. - 2-D echo and TEE results as below. - Cardiology follow-up appreciated. ACEI added. Carvedilol was added and titrated up. As per cardiology, his cardiomyopathy is likely related to viral cardiomyopathy given recent URI.? Contribution from alcohol. Doubt coronary disease given age and no chest pain. Patient will need follow-up echocardiogram in 3 months  after medications fully titrated to see if LV function improves. If not he would require ICD. Possible cardiac MRI as outpatient.  -  Cardiology recommended anticoagulation with Apixaban which can be discontinued if LV function improves. Aspirin discontinued.  Hyperlipidemia - LDL 128, goal <70. Atorvastatin 20 mg daily started.  ADD  - Decision regarding Adderall was carefully considered. Patient has been on this medication and dose for a long time. Recommendation is to stop Adderall & change this to a non-stimulant preparation such as Strattera. After discussion with neurology and psychiatry today, reduced Adderall to 30 mg daily (was on 30 mg twice daily PTA) to continue for 4 days and then discontinue to avoid withdrawal symptoms. Follow-up with PCP to consider changing to a non-stimulant agent. This was discussed at length with patient and family and they verbalized understanding.  Alcohol use/? Abuse - Abstinence counseled. CIWA protocol. No overt withdrawal seen.   Consultants:   Neurology  Cardiology  Procedures:   TEE 6/30: Study Conclusions  - Left ventricle: The cavity size was dilated. Systolic function  was severely reduced. The estimated ejection fraction was 15%.  Diffuse hypokinesis. - Aortic valve: No evidence of vegetation. - Mitral valve: No evidence of vegetation. There was mild  regurgitation. - Left atrium: The atrium was moderately dilated. No evidence of  thrombus in the atrial cavity or appendage. - Right ventricle: The cavity size was mildly dilated. Systolic  function was severely reduced. - Right atrium: The atrium was mildly dilated. No evidence of  thrombus in the atrial cavity or appendage. - Atrial septum: No defect or patent foramen ovale was identified. - Tricuspid valve: No evidence of vegetation. - Pulmonic valve: No evidence of vegetation.  Impressions:  - Severe global reduction in LV systolic function; LVE; moderate  LAE; mild  RAE and RVE with severerly reduced RV function; mild MR  and TR; negative saline microcavitation study.  2 D Echo 11/21/2015: Study Conclusions  - Left ventricle: The cavity size was severely dilated. Wall  thickness was normal. Systolic function was severely reduced. The  estimated ejection fraction was in the range of 20% to 25%.  Diffuse hypokinesis. The study is not technically sufficient to  allow evaluation of LV diastolic function. - Mitral valve: Mildly thickened leaflets . There was mild  regurgitation. - Left atrium: Moderately dilated. - Right ventricle: The cavity size was moderately dilated. The  moderator band was prominent. Systolic function is reduced. - Tricuspid valve: There was no significant regurgitation. - Inferior vena cava: The vessel was dilated. The respirophasic  diameter changes were blunted (< 50%), consistent with elevated  central venous pressure.  Impressions:  - LVEF of 20-25%, severely dilated wtih global hypokinesis and  normal wall thickness. Mild MR, moderate LAE, moderate RVE with  reduced RV function, dilated IVC.  Discharge Instructions      Discharge Instructions    Ambulatory referral to Neurology    Complete by:  As directed   Dr. Leonie Man requests follow up for this patient in 2 months.     Call MD for:  difficulty breathing, headache or visual disturbances    Complete by:  As directed      Call MD for:  extreme fatigue    Complete by:  As directed      Call MD for:  persistant dizziness or light-headedness    Complete by:  As directed      Call MD for:  severe uncontrolled pain    Complete by:  As directed      Call MD for:    Complete by:  As directed   Strokelike symptoms.  Diet - low sodium heart healthy    Complete by:  As directed      Increase activity slowly    Complete by:  As directed             Medication List    TAKE these medications        acetaminophen 500 MG tablet  Commonly known as:   TYLENOL  Take 1,000 mg by mouth every 6 (six) hours as needed for mild pain.     amphetamine-dextroamphetamine 30 MG tablet  Commonly known as:  ADDERALL  Take 1 tablet by mouth daily. Take for 4 more days then stop. Follow-up with family doctor regarding change to a new medication.     apixaban 5 MG Tabs tablet  Commonly known as:  ELIQUIS  Take 1 tablet (5 mg total) by mouth 2 (two) times daily.     atorvastatin 20 MG tablet  Commonly known as:  LIPITOR  Take 1 tablet (20 mg total) by mouth daily at 6 PM.     carvedilol 6.25 MG tablet  Commonly known as:  COREG  Take 1 tablet (6.25 mg total) by mouth 2 (two) times daily with a meal.     folic acid 1 MG tablet  Commonly known as:  FOLVITE  Take 1 tablet (1 mg total) by mouth daily.     lisinopril 5 MG tablet  Commonly known as:  PRINIVIL,ZESTRIL  Take 1 tablet (5 mg total) by mouth daily.     multivitamin with minerals Tabs tablet  Take 1 tablet by mouth daily.     thiamine 100 MG tablet  Take 1 tablet (100 mg total) by mouth daily.       Follow-up Information    Follow up with SETHI,PRAMOD, MD. Schedule an appointment as soon as possible for a visit in 2 months.   Specialties:  Neurology, Radiology   Contact information:   [address] [place] [address] [phone]       Follow up with Haywood Pao, MD. Schedule an appointment as soon as possible for a visit in 5 days.   Specialty:  Internal Medicine   Why:  Recommend repeating labs (BMP) in the next 1-2 weeks.   Contact information:   [address] [place] [address] [phone]       Follow up with Loralie Champagne, MD. Schedule an appointment as soon as possible for a visit in 1 month.   Specialty:  Cardiology   Contact information:   [place] [address] [phone]      No Known Allergies    Procedures/Studies: Ct Angio Head W Or Wo Contrast  11/22/2015  CLINICAL DATA:  Expressive  aphasia. Posterior left temporal lobe infarct on MRI. Cardiomyopathy, possibly post viral. EXAM: CT ANGIOGRAPHY HEAD AND NECK TECHNIQUE: Multidetector CT imaging of the head and neck was performed using the standard protocol during bolus administration of intravenous contrast. Multiplanar CT image reconstructions and MIPs were obtained to evaluate the vascular anatomy. Carotid stenosis measurements (when applicable) are obtained utilizing NASCET criteria, using the distal internal carotid diameter as the denominator. CONTRAST:  50 mL Isovue 370 COMPARISON:  Head MRI 11/21/2015 and CT 11/20/2015 FINDINGS: CTA NECK Aortic arch: 3 vessel aortic arch. Brachiocephalic and subclavian arteries are widely patent. Right carotid system: Widely patent without evidence of stenosis, dissection, or aneurysm. Mildly tortuous distal cervical ICA. Left carotid system: Widely patent without evidence of stenosis, dissection, or aneurysm.  Vertebral arteries: Widely patent with the left being mildly dominant. Skeleton: Unremarkable. Other neck: Borderline enlarged mediastinal lymph nodes, with the largest measuring 10 mm in short axis in the upper right paratracheal station, nonspecific but likely reactive. No neck mass. CTA HEAD Anterior circulation: Internal carotid arteries are widely patent from skullbase to carotid termini. ACAs and MCAs are patent without evidence of major branch occlusion or significant proximal stenosis. Branch vessel irregularity diffusely is favored to be artifactual. No intracranial aneurysm is identified. Posterior circulation: The intracranial vertebral arteries are patent to the basilar with the distal right vertebral artery being hypoplastic. PICA and SCA origins are patent. Basilar artery is patent without stenosis. Posterior communicating arteries are not identified. PCAs are patent without evidence of significant proximal stenosis. PCA branch vessel irregularity and mild attenuation are favored to be  artifactual. Venous sinuses: Patent. Anatomic variants: None of significance. Delayed phase: No abnormal enhancement. Mild cortical hypoattenuation in the posterior left temporal lobe corresponding to the previously demonstrated infarct. IMPRESSION: No evidence of major arterial branch occlusion or significant cervical or proximal intracranial stenosis. Electronically Signed   By: Logan Bores M.D.   On: 11/22/2015 17:04   Ct Head Wo Contrast  11/21/2015  CLINICAL DATA:  30 year old male with a lesion. EXAM: CT HEAD WITHOUT CONTRAST TECHNIQUE: Contiguous axial images were obtained from the base of the skull through the vertex without intravenous contrast. COMPARISON:  Head CT dated 10/15/2002 FINDINGS: The ventricles and the sulci are appropriate in size for the patient's age. There is no intracranial hemorrhage. No midline shift or mass effect identified. The gray-white matter differentiation is preserved. 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 The visualized paranasal sinuses and mastoid air cells are well aerated. The calvarium is intact. IMPRESSION: No acute intracranial pathology. Electronically Signed   By: Anner Crete M.D.   On: 11/21/2015 00:00   Ct Angio Neck W Or Wo Contrast  11/22/2015  CLINICAL DATA:  Expressive aphasia. Posterior left temporal lobe infarct on MRI. Cardiomyopathy, possibly post viral. EXAM: CT ANGIOGRAPHY HEAD AND NECK TECHNIQUE: Multidetector CT imaging of the head and neck was performed using the standard protocol during bolus administration of intravenous contrast. Multiplanar CT image reconstructions and MIPs were obtained to evaluate the vascular anatomy. Carotid stenosis measurements (when applicable) are obtained utilizing NASCET criteria, using the distal internal carotid diameter as the denominator. CONTRAST:  50 mL Isovue 370 COMPARISON:  Head MRI 11/21/2015 and CT 11/20/2015 FINDINGS:  CTA NECK Aortic arch: 3 vessel aortic arch. Brachiocephalic and subclavian arteries are widely patent. Right carotid system: Widely patent without evidence of stenosis, dissection, or aneurysm. Mildly tortuous distal cervical ICA. Left carotid system: Widely patent without evidence of stenosis, dissection, or aneurysm. Vertebral arteries: Widely patent with the left being mildly dominant. Skeleton: Unremarkable. Other neck: Borderline enlarged mediastinal lymph nodes, with the largest measuring 10 mm in short axis in the upper right paratracheal station, nonspecific but likely reactive. No neck mass. CTA HEAD Anterior circulation: Internal carotid arteries are widely patent from skullbase to carotid termini. ACAs and MCAs are patent without evidence of major branch occlusion or significant proximal stenosis. Branch vessel irregularity diffusely is favored to be artifactual. No intracranial aneurysm is identified. Posterior circulation: The intracranial vertebral arteries are patent to the basilar with the distal right vertebral artery being hypoplastic. PICA and SCA origins are patent. Basilar artery is patent without stenosis. Posterior communicating arteries are not  identified. PCAs are patent without evidence of significant proximal stenosis. PCA branch vessel irregularity and mild attenuation are favored to be artifactual. Venous sinuses: Patent. Anatomic variants: None of significance. Delayed phase: No abnormal enhancement. Mild cortical hypoattenuation in the posterior left temporal lobe corresponding to the previously demonstrated infarct. IMPRESSION: No evidence of major arterial branch occlusion or significant cervical or proximal intracranial stenosis. Electronically Signed   By: Logan Bores M.D.   On: 11/22/2015 17:04   Mr Brain Wo Contrast  11/21/2015  CLINICAL DATA:  Initial evaluation for acute onset expressive aphasia. EXAM: MRI HEAD WITHOUT CONTRAST TECHNIQUE: Multiplanar, multiecho pulse  sequences of the brain and surrounding structures were obtained without intravenous contrast. COMPARISON:  Prior CT from 11/20/2015. FINDINGS: Cerebral volume within normal limits for patient age. No significant white matter disease present. There is abnormal gyriform restricted diffusion involving the cortical gray matter of the posterior left temporal lobe, suspicious for acute ischemic infarct. Associated T2/FLAIR abnormality without significant mass effect. No associated hemorrhage. No other abnormal foci of restricted diffusion. Gray-white matter differentiation otherwise maintained. Major intracranial vascular flow voids are preserved. No other areas of chronic infarction. No acute or chronic intracranial hemorrhage. No mass lesion, midline shift, or mass effect. No hydrocephalus. 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 No other extra-axial fluid collection. Major dural sinuses are grossly patent. Craniocervical junction within normal limits. Visualized upper cervical spine unremarkable. Pituitary gland within normal limits. No acute abnormality about the globes and orbits. Mild scattered mucosal thickening within the ethmoidal air cells and maxillary sinuses. Paranasal sinuses are otherwise clear. No mastoid effusion. Inner ear structures grossly normal. Bone marrow signal intensity within normal limits. No scalp soft tissue abnormality. IMPRESSION: 1. 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 No associated hemorrhage or mass effect. 2. Otherwise negative brain MRI. Electronically Signed   By: Jeannine Boga M.D.   On: 11/21/2015 07:29      Subjective: Patient and family indicate that his speech is almost 90% better. Not yet at baseline. This morning, states that he was cleaning his room and then started having some dyspnea and uneasy feeling in his stomach and fleeting mid sternal chest pain on deep inspiration which resolved  immediately with rest and oral intake.  Discharge Exam:  Filed Vitals:   11/23/15 0909 11/23/15 1028 11/23/15 1423 11/23/15 1424  BP: 97/72 102/74 93/57 109/77  Pulse: 104 98 89   Temp: 98.2 F (36.8 C)  97.6 F (36.4 C)   TempSrc: Oral  Oral   Resp: 18  18   Height:      Weight:      SpO2: 95% 99% 96%     General exam: Pleasant young male sitting up comfortably in bed. Girlfriend and parents at bedside. Respiratory system: Clear to auscultation. Respiratory effort normal. Cardiovascular system: S1 & S2 heard, RRR. No JVD, murmurs, rubs, gallops or clicks. No pedal edema. Telemetry: SR-ST in 100s.  Gastrointestinal system: Abdomen is nondistended, soft and nontender. No organomegaly or masses felt. Normal bowel sounds heard. Central nervous system: Alert and oriented. No focal neurological deficits. Extremities: Symmetric 5 x 5 power. Skin: No rashes, lesions or ulcers Psychiatry: Judgement and insight appear normal. Mood & affect - normal.     The results of significant diagnostics from this hospitalization (including imaging, microbiology, ancillary and laboratory) are listed below for reference.     Microbiology: No results found for this or any previous visit (from the past 240  hour(s)).   Labs: BNP (last 3 results) No results for input(s): BNP in the last 8760 hours. Basic Metabolic Panel:  Recent Labs Lab 11/21/15 0045  NA 137  K 4.0  CL 106  CO2 20*  GLUCOSE 98  BUN 17  CREATININE 0.97  CALCIUM 9.1   Liver Function Tests:  Recent Labs Lab 11/21/15 0045  AST 24  ALT 26  ALKPHOS 68  BILITOT 1.1  PROT 7.0  ALBUMIN 4.1   No results for input(s): LIPASE, AMYLASE in the last 168 hours. No results for input(s): AMMONIA in the last 168 hours. CBC:  Recent Labs Lab 11/21/15 0045  WBC 10.5  NEUTROABS 6.6  HGB 15.8  HCT 44.5  MCV 87.1  PLT 257   Cardiac Enzymes: No results for input(s): CKTOTAL, CKMB, CKMBINDEX, TROPONINI in the last 168  hours. BNP: Invalid input(s): POCBNP CBG: No results for input(s): GLUCAP in the last 168 hours. D-Dimer No results for input(s): DDIMER in the last 72 hours. Hgb A1c  Recent Labs  11/22/15 0815  HGBA1C 5.3   Lipid Profile  Recent Labs  11/22/15 0816  CHOL 196  HDL 48  LDLCALC 128*  TRIG 102  CHOLHDL 4.1   Thyroid function studies No results for input(s): TSH, T4TOTAL, T3FREE, THYROIDAB in the last 72 hours.  Invalid input(s): FREET3 Anemia work up No results for input(s): VITAMINB12, FOLATE, FERRITIN, TIBC, IRON, RETICCTPCT in the last 72 hours. Urinalysis    Component Value Date/Time   COLORURINE AMBER* 11/21/2015 0047   APPEARANCEUR CLEAR 11/21/2015 0047   LABSPEC 1.031* 11/21/2015 0047   PHURINE 5.5 11/21/2015 0047   GLUCOSEU NEGATIVE 11/21/2015 0047   HGBUR NEGATIVE 11/21/2015 0047   BILIRUBINUR SMALL* 11/21/2015 0047   KETONESUR 15* 11/21/2015 0047   PROTEINUR NEGATIVE 11/21/2015 0047   NITRITE NEGATIVE 11/21/2015 0047   LEUKOCYTESUR NEGATIVE 11/21/2015 0047   Sepsis Labs Invalid input(s): PROCALCITONIN,  WBC,  LACTICIDVEN    Time coordinating discharge: Over 30 minutes  SIGNED:  Vernell Leep, MD, FACP, FHM. [facility] Pager [phone]  If 7PM-7AM, please contact night-coverage [email or web address] Password [facility] 11/23/2015, 4:33 PM

## 2015-11-23 NOTE — Discharge Instructions (Signed)
Stroke Prevention Some medical conditions and behaviors are associated with an increased chance of having a stroke. You may prevent a stroke by making healthy choices and managing medical conditions. HOW CAN I REDUCE MY RISK OF HAVING A STROKE?   Stay physically active. Get at least 30 minutes of activity on most or all days.  Do not smoke. It may also be helpful to avoid exposure to secondhand smoke.  Limit alcohol use. Moderate alcohol use is considered to be:  No more than 2 drinks per day for men.  No more than 1 drink per day for nonpregnant women.  Eat healthy foods. This involves:  Eating 5 or more servings of fruits and vegetables a day.  Making dietary changes that address high blood pressure (hypertension), high cholesterol, diabetes, or obesity.  Manage your cholesterol levels.  Making food choices that are high in fiber and low in saturated fat, trans fat, and cholesterol may control cholesterol levels.  Take any prescribed medicines to control cholesterol as directed by your health care provider.  Manage your diabetes.  Controlling your carbohydrate and sugar intake is recommended to manage diabetes.  Take any prescribed medicines to control diabetes as directed by your health care provider.  Control your hypertension.  Making food choices that are low in salt (sodium), saturated fat, trans fat, and cholesterol is recommended to manage hypertension.  Ask your health care provider if you need treatment to lower your blood pressure. Take any prescribed medicines to control hypertension as directed by your health care provider.  If you are 75-81 years of age, have your blood pressure checked every 3-5 years. If you are 35 years of age or older, have your blood pressure checked every year.  Maintain a healthy weight.  Reducing calorie intake and making food choices that are low in sodium, saturated fat, trans fat, and cholesterol are recommended to manage  weight.  Stop drug abuse.  Avoid taking birth control pills.  Talk to your health care provider about the risks of taking birth control pills if you are over 67 years old, smoke, get migraines, or have ever had a blood clot.  Get evaluated for sleep disorders (sleep apnea).  Talk to your health care provider about getting a sleep evaluation if you snore a lot or have excessive sleepiness.  Take medicines only as directed by your health care provider.  For some people, aspirin or blood thinners (anticoagulants) are helpful in reducing the risk of forming abnormal blood clots that can lead to stroke. If you have the irregular heart rhythm of atrial fibrillation, you should be on a blood thinner unless there is a good reason you cannot take them.  Understand all your medicine instructions.  Make sure that other conditions (such as anemia or atherosclerosis) are addressed. SEEK IMMEDIATE MEDICAL CARE IF:   You have sudden weakness or numbness of the face, arm, or leg, especially on one side of the body.  Your face or eyelid droops to one side.  You have sudden confusion.  You have trouble speaking (aphasia) or understanding.  You have sudden trouble seeing in one or both eyes.  You have sudden trouble walking.  You have dizziness.  You have a loss of balance or coordination.  You have a sudden, severe headache with no known cause.  You have new chest pain or an irregular heartbeat. Any of these symptoms may represent a serious problem that is an emergency. Do not wait to see if the symptoms will  go away. Get medical help at once. Call your local emergency services (911 in [place] Do not drive yourself to the hospital.   This information is not intended to replace advice given to you by your health care provider. Make sure you discuss any questions you have with your health care provider.   Document Released: 06/18/2004 Document Revised: 06/01/2014 Document Reviewed:  11/11/2012 Elsevier Interactive Patient Education 2016 Elsevier Inc.  Cardiomyopathy Cardiomyopathy is a long-term (chronic) disease of the heart muscle (myocardium). Over time, the heart becomes abnormally large, thick, or stiff. This makes it harder for the heart to pump blood and can lead to heart failure. There are several types of cardiomyopathy:  Dilated cardiomyopathy. This type causes the ventricles become large and weak.  Hypertrophic cardiomyopathy. This type causes the heart muscle to thicken.  Restrictive cardiomyopathy. This type causes the heart muscle to become rigid and less elastic.  Ischemic cardiomyopathy. This type involves narrowing arteries that cause the walls of the heart get thinner.  Peripartum cardiomyopathy. This type occurs during pregnancy or shortly after pregnancy. CAUSES The cause of cardiomyopathy is often not known. In some cases, it is passed down (inherited) from a family member who also had cardiomyopathy. The disease may develop as a complication of another medical condition. These conditions can include:  Diabetes.  High blood pressure.  Viral infection of the heart.  Heart attack.  Coronary heart disease. RISK FACTORS You may be more likely to develop cardiomyopathy if you:  Have a family history of cardiomyopathy or other heart problems.  Are overweight or obese.  Use illegal drugs.  Abuse alcohol.  Have diabetes.  Have another disease that can cause cardiomyopathy as a complication. SIGNS AND SYMPTOMS Often, cardiomyopathy has no signs or symptoms. If you do have symptoms, they may include:  Shortness of breath, especially during activity.  Fatigue.  An irregular heartbeat (arrhythmia).  Dizziness, light-headedness, or fainting.  Chest pain.  Swelling in the lower leg or ankle. DIAGNOSIS Your health care provider may suspect cardiomyopathy based on your symptoms and medical history. Your health care provider will  also do a physical exam. Other tests done may include:  Blood tests.  Imaging studies of your heart. These may be done using:  X-rays to check if your heart is enlarged.  Echocardiogram to show the size of your heart and how well it pumps.  MRI.  A test to record the electrical activity of your heart (electrocardiogram or ECG).  A test in which you wear a portable device (event monitor) to record your heart's electrical activity while you go about your day.  A test to monitor your heart's activity while you exercise (stress test).   A procedure to check the blood pressure and blood flow in your heart(cardiac catheterization).  Injection of dye into your arteries before imaging studies are taken (angiogram).  Removal of a sample of heart tissue (biopsy). The sample is examined for problems. TREATMENT Treatment depends on the type of cardiomyopathy you have and the severity of your symptoms. If you are not having any symptoms, you might not need treatment. If you need treatment, it may include:  Lifestyle changes.  Quit smoking, if you smoke.  Maintain a healthy weight. Lose weight if directed by your health care provider.  Eat a healthy diet. Include plenty of fruits, vegetables, and whole grains.  Get regular exercise. Ask your health care provider to suggest some activities that are good for you.  Medicine. You may need to  take medicine to:  Lower your blood pressure.  Slow down your heart rate.  Keep your heart beating in a steady rhythm.  Clear excess fluids from your body.  Prevent blood clots.  Surgery. You may need surgery to:  Repair a defect.  Remove thickened tissue.  Implant a device to treat serious heart rhythm problems (implantable cardioverter-defibrillator or ICD).  Replace your heart (heart transplant) if all other treatments have failed (end stage). HOME CARE INSTRUCTIONS  Take medicines only as directed by your health care  provider.  Eat a heart-healthy diet. Work with your health care provider or a registered dietitian to learn about healthy eating options.  Maintain a healthy weight and stay physically active.  Do not use any tobacco products, including cigarettes, chewing tobacco, or electronic cigarettes. If you need help quitting, ask your health care provider.  Work closely with your health care provider to manage chronic conditions, such as diabetes and high blood pressure.  Limit alcohol intake to no more than one drink per day for nonpregnant women and no more than two drinks per day for men. One drink equals 12 ounces of beer, 5 ounces of wine, or 1 ounces of hard liquor.  Try to get at least 7 hours of sleep each night.  Find ways to manage stress.  Keep all follow-up visits as directed by your health care provider. This is important. SEEK MEDICAL CARE IF:  Your symptoms get worse, even after treatment.  You have new symptoms. SEEK IMMEDIATE MEDICAL CARE IF:  You have severe chest pain.  You have shortness of breath.  You cough up pink, bubbly material.  You have sudden sweating.  You feel nauseous and vomit.  You suddenly become light-headed or dizzy.  You feel your heart beating very fast.  It feels like your heart is skipping beats. These symptoms may represent a serious problem that is an emergency. Do not wait to see if the symptoms will go away. Get medical help right away. Call your local emergency services (911 in the [place] Do not drive yourself to the hospital.   This information is not intended to replace advice given to you by your health care provider. Make sure you discuss any questions you have with your health care provider.   Document Released: 07/24/2004 Document Revised: 06/01/2014 Document Reviewed: 10/19/2013 Elsevier Interactive Patient Education 2016 Elsevier Inc.  Apixaban oral tablets What is this medicine? APIXABAN (a PIX a ban) is an anticoagulant  (blood thinner). It is used to lower the chance of stroke in people with a medical condition called atrial fibrillation. It is also used to treat or prevent blood clots in the lungs or in the veins. This medicine may be used for other purposes; ask your health care provider or pharmacist if you have questions. What should I tell my health care provider before I take this medicine? They need to know if you have any of these conditions: -bleeding disorders -bleeding in the brain -blood in your stools (black or tarry stools) or if you have blood in your vomit -history of stomach bleeding -kidney disease -liver disease -mechanical heart valve -an unusual or allergic reaction to apixaban, other medicines, foods, dyes, or preservatives -pregnant or trying to get pregnant -breast-feeding How should I use this medicine? Take this medicine by mouth with a glass of water. Follow the directions on the prescription label. You can take it with or without food. If it upsets your stomach, take it with food. Take your  medicine at regular intervals. Do not take it more often than directed. Do not stop taking except on your doctor's advice. Stopping this medicine may increase your risk of a blot clot. Be sure to refill your prescription before you run out of medicine. Talk to your pediatrician regarding the use of this medicine in children. Special care may be needed. Overdosage: If you think you have taken too much of this medicine contact a poison control center or emergency room at once. NOTE: This medicine is only for you. Do not share this medicine with others. What if I miss a dose? If you miss a dose, take it as soon as you can. If it is almost time for your next dose, take only that dose. Do not take double or extra doses. What may interact with this medicine? This medicine may interact with the following: -aspirin and aspirin-like medicines -certain medicines for fungal infections like ketoconazole  and itraconazole -certain medicines for seizures like carbamazepine and phenytoin -certain medicines that treat or prevent blood clots like warfarin, enoxaparin, and dalteparin -clarithromycin -NSAIDs, medicines for pain and inflammation, like ibuprofen or naproxen -rifampin -ritonavir -St. Ercil's wort This list may not describe all possible interactions. Give your health care provider a list of all the medicines, herbs, non-prescription drugs, or dietary supplements you use. Also tell them if you smoke, drink alcohol, or use illegal drugs. Some items may interact with your medicine. What should I watch for while using this medicine? Notify your doctor or health care professional and seek emergency treatment if you develop breathing problems; changes in vision; chest pain; severe, sudden headache; pain, swelling, warmth in the leg; trouble speaking; sudden numbness or weakness of the face, arm, or leg. These can be signs that your condition has gotten worse. If you are going to have surgery, tell your doctor or health care professional that you are taking this medicine. Tell your health care professional that you use this medicine before you have a spinal or epidural procedure. Sometimes people who take this medicine have bleeding problems around the spine when they have a spinal or epidural procedure. This bleeding is very rare. If you have a spinal or epidural procedure while on this medicine, call your health care professional immediately if you have back pain, numbness or tingling (especially in your legs and feet), muscle weakness, paralysis, or loss of bladder or bowel control. Avoid sports and activities that might cause injury while you are using this medicine. Severe falls or injuries can cause unseen bleeding. Be careful when using sharp tools or knives. Consider using an Neurosurgeon. Take special care brushing or flossing your teeth. Report any injuries, bruising, or red spots on the skin  to your doctor or health care professional. What side effects may I notice from receiving this medicine? Side effects that you should report to your doctor or health care professional as soon as possible: -allergic reactions like skin rash, itching or hives, swelling of the face, lips, or tongue -signs and symptoms of bleeding such as bloody or black, tarry stools; red or dark-brown urine; spitting up blood or brown material that looks like coffee grounds; red spots on the skin; unusual bruising or bleeding from the eye, gums, or nose This list may not describe all possible side effects. Call your doctor for medical advice about side effects. You may report side effects to FDA at 1-800-FDA-1088. Where should I keep my medicine? Keep out of the reach of children. Store at room temperature  between 20 and 25 degrees C (68 and 77 degrees F). Throw away any unused medicine after the expiration date. NOTE: This sheet is a summary. It may not cover all possible information. If you have questions about this medicine, talk to your doctor, pharmacist, or health care provider.    2016, Elsevier/Gold Standard. (2013-01-13 11:59:24)

## 2015-11-23 NOTE — Progress Notes (Signed)
called and left message for the case manager regarding patient's eliquis

## 2015-11-23 NOTE — Progress Notes (Signed)
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   Medical History: Past Medical History  Diagnosis Date  . ADHD (attention deficit hyperactivity disorder)     on adderall since 2004    Assessment: 30 yo m with cardiomyopathy - etiology unclear. Cards has recommended the patient start on Apixaban 5 mg PO BID for now - LOT remains unclear.  Dr. Waymon Amato has already started medication, so will sign off consult and be sure to counsel patient before discharge.  Goal of Therapy:  Monitor platelets by anticoagulation protocol: Yes   Plan:  - Continue apixaban 5 mg PO BID - Counsel before discharge  Cassie L. Roseanne Reno, PharmD Clinical Pharmacist Pager: [phone] 11/23/2015 2:17 PM

## 2015-11-23 NOTE — Progress Notes (Signed)
Case manger paged to notify about consult.

## 2015-11-23 NOTE — Progress Notes (Signed)
Patient is being d/c home. Dc instructions given to patient and caregiver. Both verbalized understanding.

## 2015-11-23 NOTE — Progress Notes (Signed)
    Subjective:  Denies CP or dyspnea; Dysarthria improved   Objective:  Filed Vitals:   11/22/15 1942 11/22/15 2153 11/23/15 0153 11/23/15 0538  BP:  116/83 112/73 93/67  Pulse:  106 104 101  Temp: 97.5 F (36.4 C) 97.6 F (36.4 C) 97.8 F (36.6 C) 98.8 F (37.1 C)  TempSrc: Oral Oral Oral Oral  Resp:  18 18 18   Height:      Weight:      SpO2:  98% 98% 97%    Intake/Output from previous day: No intake or output data in the 24 hours ending 11/23/15 2500  Physical Exam: Physical exam: Well-developed well-nourished in no acute distress.  Skin is warm and dry.  HEENT is normal.  Neck is supple.  Chest is clear to auscultation with normal expansion.  Cardiovascular exam is regular rate and rhythm. + gallop Abdominal exam nontender or distended. No masses palpated. Extremities show no edema. neuro grossly intact    Lab Results: Basic Metabolic Panel:  Recent Labs  37/04/88 0045  NA 137  K 4.0  CL 106  CO2 20*  GLUCOSE 98  BUN 17  CREATININE 0.97  CALCIUM 9.1   CBC:  Recent Labs  11/21/15 0045  WBC 10.5  NEUTROABS 6.6  HGB 15.8  HCT 44.5  MCV 87.1  PLT 257     Assessment/Plan:  1 cardiomyopathy-etiology unclear. 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 Question contribution from alcohol. Doubt coronary disease given age and no chest pain. Continue lisinopril. Increase Coreg to 6.25 mg 2 times a day. Patient will need a follow-up echocardiogram 3 months after medications fully titrated to see if LV function improves. If not he would require ICD. I have asked him to discontinue all alcohol use. Possible MRI as outpt. 2 CVA-no etiology found. Question embolic event from cardiomyopathy. i have discussed patient with Dr. Shirlee Latch. We would recommend anticoagulation for now.This could be discontinued later if LV function improves. Would begin apixaban 5 mg BID when okay with neurology and DC ASA.  3 ADHD  Olga Millers 11/23/2015, 8:32 AM

## 2015-11-23 NOTE — Care Management Note (Signed)
CM met with pt in room and gave pt free 30 day trial card for Eliquis.  Pt verbalized understanding this will pay for discharge prescription and give insurance enough time to authorize for refills.  CM explained Neuro rehab closed on weekends and placed on AVS for pt to expect a call from the [facility] on Monday to schedule an appt for speech rehab.  Cm [facility]ed all required info to [facility] with request to call the pt to schedule an appt.  No other CM needs were communicated.

## 2015-11-24 ENCOUNTER — Encounter (HOSPITAL_COMMUNITY): Payer: Self-pay | Admitting: Cardiology

## 2015-11-25 ENCOUNTER — Telehealth: Payer: Self-pay

## 2015-11-25 DIAGNOSIS — Z79899 Other long term (current) drug therapy: Secondary | ICD-10-CM

## 2015-11-25 DIAGNOSIS — R0602 Shortness of breath: Secondary | ICD-10-CM

## 2015-11-25 MED ORDER — FUROSEMIDE 20 MG PO TABS: 20.0000 mg | ORAL_TABLET | Freq: Every day | ORAL | Status: DC

## 2015-11-25 MED ORDER — RIVAROXABAN 20 MG PO TABS: 20.0000 mg | ORAL_TABLET | Freq: Every day | ORAL | Status: DC

## 2015-11-25 NOTE — Telephone Encounter (Signed)
Called patient with Dr. Fabio Bering instructions. Patient is to stop eliquis, and he is to start taking xarelto 20 mg by mouth daily and lasix 20 mg by mouth daily. Made an appointment with Azalee Course PA on Friday, and patient will need lab work with that office visit, BMET and BNP. Patient verbalized understanding.

## 2015-11-28 LAB — PROTHROMBIN GENE MUTATION

## 2015-11-29 ENCOUNTER — Ambulatory Visit (INDEPENDENT_AMBULATORY_CARE_PROVIDER_SITE_OTHER): Payer: BLUE CROSS/BLUE SHIELD | Admitting: Physician Assistant

## 2015-11-29 ENCOUNTER — Encounter: Payer: Self-pay | Admitting: Physician Assistant

## 2015-11-29 ENCOUNTER — Other Ambulatory Visit: Payer: Self-pay | Admitting: Physician Assistant

## 2015-11-29 VITALS — BP 98/75 | HR 96 | Ht 70.0 in | Wt 199.0 lb

## 2015-11-29 DIAGNOSIS — R1011 Right upper quadrant pain: Secondary | ICD-10-CM

## 2015-11-29 DIAGNOSIS — I6340 Cerebral infarction due to embolism of unspecified cerebral artery: Secondary | ICD-10-CM

## 2015-11-29 DIAGNOSIS — R1013 Epigastric pain: Secondary | ICD-10-CM | POA: Diagnosis not present

## 2015-11-29 DIAGNOSIS — I5022 Chronic systolic (congestive) heart failure: Secondary | ICD-10-CM

## 2015-11-29 DIAGNOSIS — F909 Attention-deficit hyperactivity disorder, unspecified type: Secondary | ICD-10-CM

## 2015-11-29 MED ORDER — PANTOPRAZOLE SODIUM 40 MG PO TBEC: 40.0000 mg | DELAYED_RELEASE_TABLET | Freq: Every day | ORAL | Status: DC

## 2015-11-29 NOTE — Patient Instructions (Addendum)
Medication Instructions:  START Protonix 40mg  daily. An Rx has been sent to your pharmacy  Labwork: TODAY:  LIPASE 12/03/15:  BMET  Testing/Procedures:. You are scheduled for Abdominal Ultra Sound 12/02/15 @ [facility], [employer], [address].  arriving at 12:40 NOTHING TO EAT OR DRINK 6-8 HOURS PRIOR TO YOUR APPOINTMENT.   Your physician has requested that you have an echocardiogram IN 3 MONTHS.   Echocardiography is a painless test that uses sound waves to create images of your heart. It provides your doctor with information about the size and shape of your heart and how well your heart's chambers and valves are working. This procedure takes approximately one hour. There are no restrictions for this procedure.    Follow-Up: Your physician recommends that you schedule a follow-up appointment in: 1 MONTH WITH DR. Eden Emms OR APP ON A DAY DR. Eden Emms IS IN THE OFFICE   Any Other Special Instructions Will Be Listed Below (If Applicable).  Echocardiogram An echocardiogram, or echocardiography, uses sound waves (ultrasound) to produce an image of your heart. The echocardiogram is simple, painless, obtained within a short period of time, and offers valuable information to your health care provider. The images from an echocardiogram can provide information such as:  Evidence of coronary artery disease (CAD).  Heart size.  Heart muscle function.  Heart valve function.  Aneurysm detection.  Evidence of a past heart attack.  Fluid buildup around the heart.  Heart muscle thickening.  Assess heart valve function. LET [facility] CARE PROVIDER KNOW ABOUT:  Any allergies you have.  All medicines you are taking, including vitamins, herbs, eye drops, creams, and over-the-counter medicines.  Previous problems you or members of your family have had with the use of anesthetics.  Any blood disorders you have.  Previous surgeries you have had.  Medical  conditions you have.  Possibility of pregnancy, if this applies. BEFORE THE PROCEDURE  No special preparation is needed. Eat and drink normally.  PROCEDURE   In order to produce an image of your heart, gel will be applied to your chest and a wand-like tool (transducer) will be moved over your chest. The gel will help transmit the sound waves from the transducer. The sound waves will harmlessly bounce off your heart to allow the heart images to be captured in real-time motion. These images will then be recorded.  You may need an IV to receive a medicine that improves the quality of the pictures. AFTER THE PROCEDURE You may return to your normal schedule including diet, activities, and medicines, unless your health care provider tells you otherwise.   This information is not intended to replace advice given to you by your health care provider. Make sure you discuss any questions you have with your health care provider.   Document Released: 05/08/2000 Document Revised: 06/01/2014 Document Reviewed: 01/16/2013 Elsevier Interactive Patient Education [employer]   If you need a refill on your cardiac medications before your next appointment, please call your pharmacy.

## 2015-11-29 NOTE — Progress Notes (Signed)
Cardiology Office Note    Date:  11/29/2015   ID:  Austin Tran, DOB [birth year], MRN [id number]  PCP:  Gaspar Garbe, MD  Cardiologist:  Dr. Eden Emms   Chief Complaint  Patient presents with  . Hospitalization Follow-up    seen for Dr. Eden Emms    History of Present Illness:  Austin Tran is a 30 y.o. male with past medical history of ADHD on Adderall since 2004, however no prior cardiac history. He drinks heavily roughly 4-5 beers per night and more so on the weekend. He denies ever having chest pain. He did have bad virus 2 month ago with cough. He was in his usual state of health until he woke up on 11/21/2015 with acute confusion and difficulty forming words and sentences. 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 Echocardiogram obtained on 11/21/2015 however showed EF 20-25%, severe dilatation of the left ventricle, mild MR, moderate left atrial enlargement, moderate RV enlargement with reduced RV function. Cardiology was consulted for TEE in the setting of stroke and also LV dysfunction. TEE obtained on 11/22/2015 showed severe LV dysfunction with EF 15%, severe RV dysfunction, negative saline microcavitation study. Was recommended for the patient to hold switched to nonsteroidal and hypertension like Strattera. It may be worthwhile to consider cardiac MRI at some point. He was placed on carvedilol and lisinopril.  We will obtain 3 month outpatient echocardiogram after his heart failure medication has been fully titrated, if his cardiac function does not improve, then we will consider ICD. He has been advised to stop alcohol. Given concern of embolic nature of stroke, aspirin was stopped and he was switched to eliquis. He presents today for cardiology follow-up. He was seen earlier today at his PCPs office. Since discharge especially in the last few  days, he has been complaining of epigastric and right upper quadrant pain. Both areas are tender on physical exam. I will obtain abdominal ultrasound. I will also obtain lipase. Laboratory finding obtained this morning at his PCPs office showed no sign of an anemia, white blood cell count mildly elevated at 11.4, creatinine 1.3 which is mildly elevated compared to his hospital record, potassium 4.7, mildly elevated ALT at 60, however normal AST 31, normal alkaline phosphatase and normal total bilirubin. Chest x-ray obtained at PCPs office show no significant pulmonary edema. However he has been having episodes where he will wake up at night feeling short of breath. After discharge, on 7/3, he has been placed on Lasix 20 mg daily. He was having abdominal discomfort, his eliquis was changed to Xarelto 20 mg daily. He also feels fatigued and has no energy. He has been living with his parents in [place], however is planning to go back to his own housing in [place]. He has been dealing with a lot of anxiety given his recent stroke and also significant cardiomyopathy. He has been compliant with carvedilol and lisinopril. He also has been noticing redness over his bilateral knees and also elbows. The area does not have significant tenderness to suggest hemorrhaging into the joint. I have asked him to continue to observe.  I am unclear why he is having significant abdominal discomfort, he says it is worse with food. LFT obtained at outside clinic was negative. I will obtain abdominal ultrasound. He does not appears to be significantly fluid overloaded for hepatic congestion, I do not  see significant JVD on physical exam either. We'll obtain a lipase today. I have advised him to continue to observe the symptom, if it worsens, he has been advised to seek medical attention immediately. Our plan is to obtain repeat echocardiogram in 3 month, if his EF does not improve by then, we need to discuss potential ICD placement  which would be very unfortunate for this 30 year old young man. As for the worsening creatinine after initiating Lasix, he clearly has PND episodes, however unclear why he he is also having elevated creatinine on Lasix which seems to contradict his symptom, given this is a single lab, I have advised to repeat a basic metabolic panel next Tuesday to reassess creatinine.    Past Medical History  Diagnosis Date  . ADHD (attention deficit hyperactivity disorder)     on adderall since 2004    Past Surgical History  Procedure Laterality Date  . Knee surgery    . Eye surgery    . Tee without cardioversion N/A 11/22/2015    Procedure: TRANSESOPHAGEAL ECHOCARDIOGRAM (TEE);  Surgeon: Lewayne Bunting, MD;  Location: [facility] ENDOSCOPY;  Service: Cardiovascular;  Laterality: N/A;    Current Medications: Outpatient Prescriptions Prior to Visit  Medication Sig Dispense Refill  . atorvastatin (LIPITOR) 20 MG tablet Take 1 tablet (20 mg total) by mouth daily at 6 PM. 30 tablet 0  . carvedilol (COREG) 6.25 MG tablet Take 1 tablet (6.25 mg total) by mouth 2 (two) times daily with a meal. 60 tablet 0  . folic acid (FOLVITE) 1 MG tablet Take 1 tablet (1 mg total) by mouth daily. 30 tablet 0  . furosemide (LASIX) 20 MG tablet Take 1 tablet (20 mg total) by mouth daily. 30 tablet 11  . lisinopril (PRINIVIL,ZESTRIL) 5 MG tablet Take 1 tablet (5 mg total) by mouth daily. 30 tablet 0  . Multiple Vitamin (MULTIVITAMIN WITH MINERALS) TABS tablet Take 1 tablet by mouth daily.    . rivaroxaban (XARELTO) 20 MG TABS tablet Take 1 tablet (20 mg total) by mouth daily with supper. 30 tablet 11  . thiamine 100 MG tablet Take 1 tablet (100 mg total) by mouth daily. 30 tablet 0  . acetaminophen (TYLENOL) 500 MG tablet Take 1,000 mg by mouth every 6 (six) hours as needed for mild pain. Reported on 11/29/2015    . amphetamine-dextroamphetamine (ADDERALL) 30 MG tablet Take 1 tablet by mouth daily. Take for 4 more days then stop.  Follow-up with family doctor regarding change to a new medication. (Patient not taking: Reported on 11/29/2015)     No facility-administered medications prior to visit.     Allergies:   Review of patient's allergies indicates no known allergies.   Social History   Social History  . Marital Status: Single    Spouse Name: N/A  . Number of Children: N/A  . Years of Education: N/A   Social History Main Topics  . Smoking status: Current Some Day Smoker    Types: Cigarettes  . Smokeless tobacco: None  . Alcohol Use: 7.2 oz/week    12 Cans of beer per week     Comment: drink 4-5 beers a night, more on the weekend  . Drug Use: No  . Sexual Activity: Not Asked   Other Topics Concern  . None   Social History Narrative     Family History:  The patient's family history includes Cancer in his maternal grandmother; Hypertension in his father and mother.   ROS:   Please see  the history of present illness.    ROS All other systems reviewed and are negative.   PHYSICAL EXAM:   VS:  BP 98/75 mmHg  Pulse 96  Ht  (1.778 m)  Wt 199 lb (90.266 kg)  BMI 28.55 kg/m2   GEN: Well nourished, well developed, in no acute distress HEENT: normal Neck: no JVD, carotid bruits, or masses Cardiac: RRR; no murmurs, rubs, or gallops,no edema  Respiratory:  clear to auscultation bilaterally, normal work of breathing GI: soft, nontender, nondistended, + BS MS: no deformity or atrophy Skin: warm and dry, no rash Neuro:  Alert and Oriented x 3, Strength and sensation are intact Psych: euthymic mood, full affect  Wt Readings from Last 3 Encounters:  11/29/15 199 lb (90.266 kg)  11/22/15 197 lb (89.359 kg)  11/20/15 197 lb 3.2 oz (89.449 kg)      Studies/Labs Reviewed:   EKG:  EKG is not ordered today.    Recent Labs: 11/21/2015: ALT 26; BUN 17; Creatinine, Ser 0.97; Hemoglobin 15.8; Platelets 257; Potassium 4.0; Sodium 137   Lipid Panel    Component Value Date/Time   CHOL 196  11/22/2015 0816   TRIG 102 11/22/2015 0816   HDL 48 11/22/2015 0816   CHOLHDL 4.1 11/22/2015 0816   VLDL 20 11/22/2015 0816   LDLCALC 128* 11/22/2015 0816    Additional studies/ records that were reviewed today include:   Echo 11/21/2015 LV EF: 20% - 25%  ------------------------------------------------------------------- Indications: CVA 436.  ------------------------------------------------------------------- History: Risk factors: Current tobacco use.  ------------------------------------------------------------------- Study Conclusions  - Left ventricle: The cavity size was severely dilated. Wall  thickness was normal. Systolic function was severely reduced. The  estimated ejection fraction was in the range of 20% to 25%.  Diffuse hypokinesis. The study is not technically sufficient to  allow evaluation of LV diastolic function. - Mitral valve: Mildly thickened leaflets . There was mild  regurgitation. - Left atrium: Moderately dilated. - Right ventricle: The cavity size was moderately dilated. The  moderator band was prominent. Systolic function is reduced. - Tricuspid valve: There was no significant regurgitation. - Inferior vena cava: The vessel was dilated. The respirophasic  diameter changes were blunted (< 50%), consistent with elevated  central venous pressure.  Impressions:  - LVEF of 20-25%, severely dilated wtih global hypokinesis and  normal wall thickness. Mild MR, moderate LAE, moderate RVE with  reduced RV function, dilated IVC.   TEE 11/22/2015 LV EF: 15%  ------------------------------------------------------------------- Indications: CVA 436.  ------------------------------------------------------------------- History: PMH: No prior cardiac history.  ------------------------------------------------------------------- Study Conclusions  - Left ventricle: The cavity size was dilated. Systolic function   was severely reduced. The estimated ejection fraction was 15%.  Diffuse hypokinesis. - Aortic valve: No evidence of vegetation. - Mitral valve: No evidence of vegetation. There was mild  regurgitation. - Left atrium: The atrium was moderately dilated. No evidence of  thrombus in the atrial cavity or appendage. - Right ventricle: The cavity size was mildly dilated. Systolic  function was severely reduced. - Right atrium: The atrium was mildly dilated. No evidence of  thrombus in the atrial cavity or appendage. - Atrial septum: No defect or patent foramen ovale was identified. - Tricuspid valve: No evidence of vegetation. - Pulmonic valve: No evidence of vegetation.  Impressions:  - Severe global reduction in LV systolic function; LVE; moderate  LAE; mild RAE and RVE with severerly reduced RV function; mild MR  and TR; negative saline microcavitation study.   11/21/2015 IMPRESSION: 1. Acute  ischemic cortical infarct involving the posterior left temporal lobe. No associated hemorrhage or mass effect. 2. Otherwise negative brain MRI.   Carotid U/S 11/21/2015  Summary: No significant extracranial carotid artery stenosis demonstrated. Vertebrals are patent with antegrade flow.  ASSESSMENT:    1. RUQ pain   2. Epigastric pain   3. Chronic systolic heart failure (HCC)   4. Cerebrovascular accident (CVA) due to embolism of cerebral artery (HCC)   5. Attention deficit hyperactivity disorder (ADHD), unspecified ADHD type      PLAN:  In order of problems listed above:  1. Newly diagnosed LV dysfunction  - by history post viral in nature, relatively lower suspicion for ischemia given age, although cannot r/o embolic coronary event. Plan for reassessment of EF with repeat echo in 3 month, continue current coreg and lisinopril, unable to add spironolactone. If EF remain low in 3 month, may need to discuss ICD  - he has been describing what sounds like PND episodes, but his  lung is clear, he was started on 20mg  lasix, his Cr is 1.3 today obtained at PCP's office, last Cr 0.97 in the hospital, unclear if this is a trend for dehydration as his complaints is contradictory to the lab, will obtain BMET next Tue to reassess  - given his multiple complains, i think he needs closer followup, willl have him come back in 1 month for reassessment. If his EF does not improve, he runs the risk of low output HF, although he is tolerating 2 BP medications at current time with reasonable blood pressure. Dr. Eden Emms mentions in the hospital consider cardiac MRI as outpatient, will defer this workup for Dr. Eden Emms to decide  2. Epigastric and RUQ pain: outside lab reviewed, normal LFT. Obvious tenderness noted on physical exam, unclear cause, gallbladder vs pancreatitis vs gastritis vs peptic ulcer. He does not use significant amount of NSAID, and doubt his recent illness is severe enough to cause gastritis. However will given him protonix for GI protection. Check Lipase Obtain abdomina U/S. If negative, would defer PCP for more workup or referral to GI.   - initially though it was eliquis, but symptom no better after transitioned to Xarelto, recommend continue outpatient work, however if symptom worsen, he will need to seek medical attention immediately  3. Recent CVA: followed by neurology, concerning for embolic in nature, TEE negative. Current in Xarelto, he does have some redness in the knees and elbow, however, no obvious bleeding into the joint, discussed with patient symptom of bleeding, instructed him to monitor.  4. ADHD: no longer on Adderall  5. EtOH abuse: advised on multiple occasions this EtOH may worsen his cardiomyopathy.     Medication Adjustments/Labs and Tests Ordered: Current medicines are reviewed at length with the patient today.  Concerns regarding medicines are outlined above.  Medication changes, Labs and Tests ordered today are listed in the Patient Instructions  below. Patient Instructions  Medication Instructions:  START Protonix 40mg  daily. An Rx has been sent to your pharmacy  Labwork: TODAY:  LIPASE 12/03/15:  BMET  Testing/Procedures:. You are scheduled for Abdominal Ultra Sound 12/02/15 @ [facility], [employer], [address].  arriving at 12:40 NOTHING TO EAT OR DRINK 6-8 HOURS PRIOR TO YOUR APPOINTMENT.   Your physician has requested that you have an echocardiogram IN 3 MONTHS.   Echocardiography is a painless test that uses sound waves to create images of your heart. It provides your doctor with information about the size  and shape of your heart and how well your heart's chambers and valves are working. This procedure takes approximately one hour. There are no restrictions for this procedure.    Follow-Up: Your physician recommends that you schedule a follow-up appointment in: 1 MONTH WITH DR. Eden Emms OR APP ON A DAY DR. Eden Emms IS IN THE OFFICE   Any Other Special Instructions Will Be Listed Below (If Applicable).  Echocardiogram An echocardiogram, or echocardiography, uses sound waves (ultrasound) to produce an image of your heart. The echocardiogram is simple, painless, obtained within a short period of time, and offers valuable information to your health care provider. The images from an echocardiogram can provide information such as:  Evidence of coronary artery disease (CAD).  Heart size.  Heart muscle function.  Heart valve function.  Aneurysm detection.  Evidence of a past heart attack.  Fluid buildup around the heart.  Heart muscle thickening.  Assess heart valve function. LET [facility] CARE PROVIDER KNOW ABOUT:  Any allergies you have.  All medicines you are taking, including vitamins, herbs, eye drops, creams, and over-the-counter medicines.  Previous problems you or members of your family have had with the use of anesthetics.  Any blood disorders you have.  Previous  surgeries you have had.  Medical conditions you have.  Possibility of pregnancy, if this applies. BEFORE THE PROCEDURE  No special preparation is needed. Eat and drink normally.  PROCEDURE   In order to produce an image of your heart, gel will be applied to your chest and a wand-like tool (transducer) will be moved over your chest. The gel will help transmit the sound waves from the transducer. The sound waves will harmlessly bounce off your heart to allow the heart images to be captured in real-time motion. These images will then be recorded.  You may need an IV to receive a medicine that improves the quality of the pictures. AFTER THE PROCEDURE You may return to your normal schedule including diet, activities, and medicines, unless your health care provider tells you otherwise.   This information is not intended to replace advice given to you by your health care provider. Make sure you discuss any questions you have with your health care provider.   Document Released: 05/08/2000 Document Revised: 06/01/2014 Document Reviewed: 01/16/2013 Elsevier Interactive Patient Education [employer]   If you need a refill on your cardiac medications before your next appointment, please call your pharmacy.       Ramond Dial, [place]  11/29/2015 10:40 PM    [facility] [address] [place], [place], [place]  [address] Phone: [phone]; Fax: [phone]

## 2015-11-30 ENCOUNTER — Telehealth: Payer: Self-pay | Admitting: Physician Assistant

## 2015-11-30 LAB — FACTOR 5 LEIDEN

## 2015-11-30 LAB — LIPASE: Lipase: 13 U/L (ref 7–60)

## 2015-11-30 NOTE — Telephone Encounter (Signed)
Azalee Course PA-C requested I inform the patient his lipase level was normal. The patient verbalized understanding and says his abd pain is a lot better - he stopped his MVI and wonders if this has helped. The patient will f/u with his PCP for further sx. Further f/u as outlined in Hao's note. Dayna Dunn PA-C

## 2015-12-02 ENCOUNTER — Ambulatory Visit
Admission: RE | Admit: 2015-12-02 | Discharge: 2015-12-02 | Disposition: A | Discharge status: Home / Self Care | Payer: BLUE CROSS/BLUE SHIELD | Source: Ambulatory Visit | Attending: Physician Assistant | Admitting: Physician Assistant

## 2015-12-02 ENCOUNTER — Other Ambulatory Visit: Payer: BLUE CROSS/BLUE SHIELD

## 2015-12-02 DIAGNOSIS — R1011 Right upper quadrant pain: Secondary | ICD-10-CM

## 2015-12-02 DIAGNOSIS — R1013 Epigastric pain: Secondary | ICD-10-CM

## 2015-12-05 ENCOUNTER — Encounter: Payer: Self-pay | Admitting: Gastroenterology

## 2015-12-12 ENCOUNTER — Telehealth: Payer: Self-pay | Admitting: Physician Assistant

## 2015-12-12 NOTE — Telephone Encounter (Signed)
That's fine is he still taking lisinopril

## 2015-12-12 NOTE — Telephone Encounter (Signed)
Called patient back. Patient complaining of low BP and nausea. Patient reported his BP at 100/78 and HR 60-80. Patient stated his PCP stopped his lasix and has him taking 3.125 Carvedilol BID for about a week. Went over all of patient's medications. Patient was not taking his Protonix on an empty stomach. Informed patient that taking Protonix on an empty stomach would help. Encouraged patient to drink some fluids to keep hydrated and this would also help with his BP. Will forward to Dr. Eden Emms for further advisement.

## 2015-12-12 NOTE — Telephone Encounter (Signed)
New message    Pt has questions about his medication. Please call.    Pt c/o medication issue:  1. Name of Medication: pt verbalized that he is not sure which medication  2. How are you currently taking this medication (dosage and times per day)?pt verbalized that he is not sure which medication  3. Are you having a reaction (difficulty breathing--STAT)? Having low blood pressure and feeling nausea at night  4. What is your medication issue? Wants to discuss with the nurse

## 2015-12-12 NOTE — Telephone Encounter (Signed)
Left message for patient to call back  

## 2015-12-13 NOTE — Telephone Encounter (Signed)
good

## 2015-12-13 NOTE — Telephone Encounter (Signed)
Left message for patient to call back  

## 2015-12-13 NOTE — Telephone Encounter (Signed)
Called patient back about his BP. Patient stated he is feeling a little better today, but is still having nausea and now vomiting at night. Informed patient that if his BP is still running low to hold his lisinopril. Asked patient to hold his Lipitor tonight to see if it might be causing his stomach upset, and make sure he takes all his other medications. Patient denies any blood in his stool and emesis. Asked patient if he has talked to his PCP, and patient stated their office directed him to call his cardiologist. Will route to Dr. Eden Emms for further advisement.

## 2015-12-13 NOTE — Telephone Encounter (Signed)
Follow up ° ° ° ° ° °Returning a call to the nurse °

## 2015-12-16 ENCOUNTER — Encounter (HOSPITAL_COMMUNITY): Payer: Self-pay

## 2015-12-17 ENCOUNTER — Emergency Department (HOSPITAL_COMMUNITY): Payer: BLUE CROSS/BLUE SHIELD

## 2015-12-17 ENCOUNTER — Inpatient Hospital Stay (HOSPITAL_COMMUNITY)
Admission: EM | Admit: 2015-12-17 | Discharge: 2015-12-23 | DRG: 291 | Disposition: A | Discharge status: Home Health | Payer: BLUE CROSS/BLUE SHIELD | Attending: Internal Medicine | Admitting: Internal Medicine

## 2015-12-17 ENCOUNTER — Encounter (HOSPITAL_COMMUNITY): Payer: Self-pay | Admitting: Emergency Medicine

## 2015-12-17 ENCOUNTER — Telehealth: Payer: Self-pay | Admitting: Cardiovascular Disease

## 2015-12-17 DIAGNOSIS — I5023 Acute on chronic systolic (congestive) heart failure: Secondary | ICD-10-CM | POA: Diagnosis present

## 2015-12-17 DIAGNOSIS — R109 Unspecified abdominal pain: Secondary | ICD-10-CM | POA: Diagnosis present

## 2015-12-17 DIAGNOSIS — R55 Syncope and collapse: Secondary | ICD-10-CM | POA: Diagnosis present

## 2015-12-17 DIAGNOSIS — I130 Hypertensive heart and chronic kidney disease with heart failure and stage 1 through stage 4 chronic kidney disease, or unspecified chronic kidney disease: Principal | ICD-10-CM | POA: Diagnosis present

## 2015-12-17 DIAGNOSIS — Z79899 Other long term (current) drug therapy: Secondary | ICD-10-CM

## 2015-12-17 DIAGNOSIS — R7989 Other specified abnormal findings of blood chemistry: Secondary | ICD-10-CM | POA: Diagnosis present

## 2015-12-17 DIAGNOSIS — E785 Hyperlipidemia, unspecified: Secondary | ICD-10-CM | POA: Diagnosis present

## 2015-12-17 DIAGNOSIS — I509 Heart failure, unspecified: Secondary | ICD-10-CM

## 2015-12-17 DIAGNOSIS — K219 Gastro-esophageal reflux disease without esophagitis: Secondary | ICD-10-CM | POA: Diagnosis present

## 2015-12-17 DIAGNOSIS — R1084 Generalized abdominal pain: Secondary | ICD-10-CM | POA: Diagnosis not present

## 2015-12-17 DIAGNOSIS — E876 Hypokalemia: Secondary | ICD-10-CM | POA: Diagnosis present

## 2015-12-17 DIAGNOSIS — Z8249 Family history of ischemic heart disease and other diseases of the circulatory system: Secondary | ICD-10-CM

## 2015-12-17 DIAGNOSIS — Z809 Family history of malignant neoplasm, unspecified: Secondary | ICD-10-CM

## 2015-12-17 DIAGNOSIS — B3324 Viral cardiomyopathy: Secondary | ICD-10-CM | POA: Diagnosis present

## 2015-12-17 DIAGNOSIS — I420 Dilated cardiomyopathy: Secondary | ICD-10-CM | POA: Diagnosis present

## 2015-12-17 DIAGNOSIS — N179 Acute kidney failure, unspecified: Secondary | ICD-10-CM | POA: Diagnosis not present

## 2015-12-17 DIAGNOSIS — F17210 Nicotine dependence, cigarettes, uncomplicated: Secondary | ICD-10-CM | POA: Diagnosis present

## 2015-12-17 DIAGNOSIS — R112 Nausea with vomiting, unspecified: Secondary | ICD-10-CM

## 2015-12-17 DIAGNOSIS — A419 Sepsis, unspecified organism: Secondary | ICD-10-CM

## 2015-12-17 DIAGNOSIS — Z8673 Personal history of transient ischemic attack (TIA), and cerebral infarction without residual deficits: Secondary | ICD-10-CM

## 2015-12-17 DIAGNOSIS — R570 Cardiogenic shock: Secondary | ICD-10-CM | POA: Diagnosis not present

## 2015-12-17 DIAGNOSIS — E872 Acidosis: Secondary | ICD-10-CM | POA: Diagnosis not present

## 2015-12-17 DIAGNOSIS — R197 Diarrhea, unspecified: Secondary | ICD-10-CM

## 2015-12-17 DIAGNOSIS — R740 Nonspecific elevation of levels of transaminase and lactic acid dehydrogenase [LDH]: Secondary | ICD-10-CM | POA: Diagnosis present

## 2015-12-17 DIAGNOSIS — F909 Attention-deficit hyperactivity disorder, unspecified type: Secondary | ICD-10-CM | POA: Diagnosis present

## 2015-12-17 DIAGNOSIS — I639 Cerebral infarction, unspecified: Secondary | ICD-10-CM | POA: Diagnosis present

## 2015-12-17 DIAGNOSIS — Z7901 Long term (current) use of anticoagulants: Secondary | ICD-10-CM

## 2015-12-17 HISTORY — DX: Hyperlipidemia, unspecified: E78.5

## 2015-12-17 HISTORY — DX: Essential (primary) hypertension: I10

## 2015-12-17 HISTORY — DX: Chronic systolic (congestive) heart failure: I50.22

## 2015-12-17 HISTORY — DX: Cerebral infarction, unspecified: I63.9

## 2015-12-17 HISTORY — DX: Tobacco use: Z72.0

## 2015-12-17 HISTORY — DX: Gastro-esophageal reflux disease without esophagitis: K21.9

## 2015-12-17 LAB — BASIC METABOLIC PANEL
Anion gap: 11 (ref 5–15)
BUN: 22 mg/dL — ABNORMAL HIGH (ref 6–20)
CALCIUM: 9.1 mg/dL (ref 8.9–10.3)
CO2: 17 mmol/L — ABNORMAL LOW (ref 22–32)
CREATININE: 1.42 mg/dL — AB (ref 0.61–1.24)
Chloride: 109 mmol/L (ref 101–111)
GFR calc non Af Amer: >60 mL/min (ref >60)
Glucose, Bld: 131 mg/dL — ABNORMAL HIGH (ref 65–99)
Potassium: 4.3 mmol/L (ref 3.5–5.1)
SODIUM: 137 mmol/L (ref 135–145)

## 2015-12-17 LAB — I-STAT CG4 LACTIC ACID, ED
LACTIC ACID, VENOUS: 2.4 mmol/L — AB (ref 0.5–1.9)
LACTIC ACID, VENOUS: 1.74 mmol/L (ref 0.5–1.9)

## 2015-12-17 LAB — CBC
HCT: 48.6 % (ref 39.0–52.0)
Hemoglobin: 15.9 g/dL (ref 13.0–17.0)
MCH: 29.4 pg (ref 26.0–34.0)
MCHC: 32.7 g/dL (ref 30.0–36.0)
MCV: 89.8 fL (ref 78.0–100.0)
PLATELETS: 305 10*3/uL (ref 150–400)
RBC: 5.41 MIL/uL (ref 4.22–5.81)
RDW: 13.4 % (ref 11.5–15.5)
WBC: 15.3 10*3/uL — AB (ref 4.0–10.5)

## 2015-12-17 LAB — I-STAT VENOUS BLOOD GAS, ED
ACID-BASE DEFICIT: 8 mmol/L — AB (ref 0.0–2.0)
Bicarbonate: 14.1 mEq/L — ABNORMAL LOW (ref 20.0–24.0)
O2 SAT: 92 %
PO2 VEN: 63 mmHg — AB (ref 31.0–45.0)
TCO2: 15 mmol/L (ref 0–100)
pCO2, Ven: 23 mmHg — ABNORMAL LOW (ref 45.0–50.0)
pH, Ven: 7.395 — ABNORMAL HIGH (ref 7.250–7.300)

## 2015-12-17 LAB — D-DIMER, QUANTITATIVE: D-Dimer, Quant: 4.58 ug/mL-FEU — ABNORMAL HIGH (ref 0.00–0.50)

## 2015-12-17 LAB — BRAIN NATRIURETIC PEPTIDE: B NATRIURETIC PEPTIDE 5: 2890.3 pg/mL — AB (ref 0.0–100.0)

## 2015-12-17 MED ORDER — LORAZEPAM 1 MG PO TABS
1.0000 mg | ORAL_TABLET | Freq: Once | ORAL | Status: AC
  Administered 2015-12-17: 1 mg via ORAL
  Filled 2015-12-17: qty 1

## 2015-12-17 MED ORDER — FUROSEMIDE 10 MG/ML IJ SOLN
40.0000 mg | Freq: Once | INTRAMUSCULAR | Status: AC
  Administered 2015-12-17: 40 mg via INTRAVENOUS
  Filled 2015-12-17: qty 4

## 2015-12-17 MED ORDER — MECLIZINE HCL 25 MG PO TABS
12.5000 mg | ORAL_TABLET | Freq: Once | ORAL | Status: AC
  Administered 2015-12-17: 12.5 mg via ORAL
  Filled 2015-12-17: qty 1

## 2015-12-17 MED ORDER — ONDANSETRON HCL 4 MG PO TABS
4.0000 mg | ORAL_TABLET | Freq: Once | ORAL | Status: AC
  Administered 2015-12-17: 4 mg via ORAL
  Filled 2015-12-17: qty 1

## 2015-12-17 MED ORDER — IOPAMIDOL (ISOVUE-370) INJECTION 76%
INTRAVENOUS | Status: AC
  Administered 2015-12-17: 100 mL
  Filled 2015-12-17: qty 100

## 2015-12-17 MED ORDER — SODIUM CHLORIDE 0.9 % IV BOLUS (SEPSIS)
500.0000 mL | Freq: Once | INTRAVENOUS | Status: AC
  Administered 2015-12-17: 500 mL via INTRAVENOUS

## 2015-12-17 NOTE — ED Notes (Signed)
Patient transported to CT 

## 2015-12-17 NOTE — ED Notes (Signed)
Pt. Refused CT at this time because he feels to anxious.

## 2015-12-17 NOTE — ED Provider Notes (Signed)
[facility]-EMERGENCY DEPT Provider Note   CSN: [id number] Arrival date & time: 12/17/15  1751  First Provider Contact:  None       History   Chief Complaint Chief Complaint  Patient presents with  . Near Syncope    HPI Austin Tran is a 30 y.o. male.  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 He presents today for 5-6 days of dizziness with nausea. He reports the nausea is worse at night. He estimates he has 2 episodes of emesis daily. No hematemesis. He denies headache. He's had difficulty breathing particularly at night. He will wake up short of breath and the shortness of breath resolves when he sits up for a little while. His cardiologist recommended he use more pillows and this does not seem to help.  He states he's been having a significant increase in panic attacks with associated shortness of breath as well. He thinks his tachypnea may be due to constant nervousness.   He denies recent fever, cough, chills, headache, vision changes, dysuria, abdominal pain, back pain, weakness, numbness, or lightheadedness.   The history is provided by the patient.    Past Medical History:  Diagnosis Date  . ADHD (attention deficit hyperactivity disorder)    on adderall since 2004  . Chronic systolic (congestive) heart failure (HCC)   . GERD (gastroesophageal reflux disease)   . HLD (hyperlipidemia)   . Hypertension   . Stroke (HCC)   . Tobacco abuse     Patient Active Problem List   Diagnosis Date Noted  . Pulmonary edema 12/18/2015  . Acute on chronic systolic (congestive) heart failure (HCC) 12/18/2015  . Elevated lactic acid level 12/18/2015  . Near syncope 12/18/2015  . Abdominal pain 12/18/2015  . HLD (hyperlipidemia)   . GERD (gastroesophageal reflux disease)   . Aphasia   . Congestive dilated cardiomyopathy (HCC) 11/22/2015  . Acute CVA (cerebrovascular  accident) (HCC) 11/22/2015  . CVA (cerebral infarction) 11/21/2015  . Attention deficit hyperactivity disorder (ADHD) 07/22/2006    Past Surgical History:  Procedure Laterality Date  . EYE SURGERY    . KNEE SURGERY    . TEE WITHOUT CARDIOVERSION N/A 11/22/2015   Procedure: TRANSESOPHAGEAL ECHOCARDIOGRAM (TEE);  Surgeon: Lewayne Bunting, MD;  Location: [facility] ENDOSCOPY;  Service: Cardiovascular;  Laterality: N/A;       Home Medications    Prior to Admission medications   Medication Sig Start Date End Date Taking? Authorizing Provider  carvedilol (COREG) 6.25 MG tablet Take 1 tablet (6.25 mg total) by mouth 2 (two) times daily with a meal. Patient taking differently: Take 3.125 mg by mouth 2 (two) times daily with a meal.  11/23/15  Yes Elease Etienne, MD  doxylamine, Sleep, (UNISOM) 25 MG tablet Take 25 mg by mouth at bedtime as needed for sleep.   Yes Historical Provider, MD  folic acid (FOLVITE) 1 MG tablet Take 1 tablet (1 mg total) by mouth daily. 11/23/15  Yes Elease Etienne, MD  Multiple Vitamin (MULTIVITAMIN WITH MINERALS) TABS tablet Take 1 tablet by mouth daily. 11/23/15  Yes Elease Etienne, MD  rivaroxaban (XARELTO) 20 MG TABS tablet Take 1 tablet (20 mg total) by mouth daily with supper. 11/25/15  Yes Wendall Stade, MD  thiamine 100 MG tablet Take 1 tablet (100 mg total) by mouth daily. 11/23/15  Yes Elease Etienne, MD  atorvastatin (LIPITOR) 20 MG tablet  Take 1 tablet (20 mg total) by mouth daily at 6 PM. Patient not taking: Reported on 12/17/2015 11/23/15   Elease Etienne, MD  furosemide (LASIX) 20 MG tablet Take 1 tablet (20 mg total) by mouth daily. 11/25/15   Wendall Stade, MD  lisinopril (PRINIVIL,ZESTRIL) 5 MG tablet Take 1 tablet (5 mg total) by mouth daily. Patient not taking: Reported on 12/17/2015 11/23/15   Elease Etienne, MD  pantoprazole (PROTONIX) 40 MG tablet Take 1 tablet (40 mg total) by mouth daily. 11/29/15   Azalee Course, PA    Family History Family History    Problem Relation Age of Onset  . Hypertension Mother   . Hypertension Father   . Cancer Maternal Grandmother     Social History Social History  Substance Use Topics  . Smoking status: Current Some Day Smoker    Types: Cigarettes  . Smokeless tobacco: Not on file  . Alcohol use 7.2 oz/week    12 Cans of beer per week     Comment: drink 4-5 beers a night, more on the weekend     Allergies   Review of patient's allergies indicates no known allergies.   Review of Systems Review of Systems  Constitutional: Negative for chills, diaphoresis, fatigue and fever.  HENT: Negative for sore throat.   Eyes: Negative for visual disturbance.  Respiratory: Positive for shortness of breath. Negative for cough.   Cardiovascular: Positive for palpitations. Negative for chest pain.  Gastrointestinal: Positive for diarrhea, nausea and vomiting. Negative for abdominal pain and constipation.  Genitourinary: Negative for dysuria and hematuria.  Musculoskeletal: Negative for arthralgias and back pain.  Skin: Negative for color change and rash.  Neurological: Negative for seizures and syncope.  All other systems reviewed and are negative.    Physical Exam Updated Vital Signs BP 110/84   Pulse 96   Temp 97.7 F (36.5 C) (Oral)   Resp (!) 52   SpO2 90%   Physical Exam  Constitutional: He is oriented to person, place, and time. He appears well-developed and well-nourished. He appears distressed.  HENT:  Head: Normocephalic and atraumatic.  Eyes: Conjunctivae are normal.  Neck: Neck supple.  Cardiovascular: Normal rate and regular rhythm.   No murmur heard. Pulmonary/Chest: Effort normal and breath sounds normal. No respiratory distress.  Abdominal: Soft. He exhibits no mass. There is no tenderness. There is no guarding.  Musculoskeletal: He exhibits no edema.  Neurological: He is alert and oriented to person, place, and time. No cranial nerve deficit.  Normal cranial nerves III through  XII. Normal finger to nose and heel to shin bilaterally. Normal rapid finger tapping. No pronator drift. Normal strength in upper and lower extremities bilaterally. Normal sensation in upper and lower extremities bilaterally. Positive Romberg. Normal gait.  Skin: Skin is warm and dry. He is not diaphoretic.  Psychiatric: He has a normal mood and affect.  Nursing note and vitals reviewed.    ED Treatments / Results  Labs (all labs ordered are listed, but only abnormal results are displayed) Labs Reviewed  BASIC METABOLIC PANEL - Abnormal; Notable for the following:       Result Value   CO2 17 (*)    Glucose, Bld 131 (*)    BUN 22 (*)    Creatinine, Ser 1.42 (*)    All other components within normal limits  CBC - Abnormal; Notable for the following:    WBC 15.3 (*)    All other components within normal limits  BRAIN NATRIURETIC PEPTIDE - Abnormal; Notable for the following:    B Natriuretic Peptide 2,890.3 (*)    All other components within normal limits  D-DIMER, QUANTITATIVE (NOT AT [facility]) - Abnormal; Notable for the following:    D-Dimer, Quant 4.58 (*)    All other components within normal limits  URINALYSIS, ROUTINE W REFLEX MICROSCOPIC (NOT AT [facility]) - Abnormal; Notable for the following:    Specific Gravity, Urine 1.037 (*)    All other components within normal limits  I-STAT CG4 LACTIC ACID, ED - Abnormal; Notable for the following:    Lactic Acid, Venous 2.40 (*)    All other components within normal limits  I-STAT VENOUS BLOOD GAS, ED - Abnormal; Notable for the following:    pH, Ven 7.395 (*)    pCO2, Ven 23.0 (*)    pO2, Ven 63.0 (*)    Bicarbonate 14.1 (*)    Acid-base deficit 8.0 (*)    All other components within normal limits  OVA + PARASITE EXAM  C DIFFICILE QUICK SCREEN W PCR REFLEX  HEPATIC FUNCTION PANEL  BLOOD GAS, VENOUS  PROTIME-INR  APTT  I-STAT CG4 LACTIC ACID, ED    EKG  EKG Interpretation  Date/Time:  Tuesday December 17 2015 18:04:39  EDT Ventricular Rate:  115 PR Interval:  134 QRS Duration: 92 QT Interval:  334 QTC Calculation: 462 R Axis:   71 Text Interpretation:  Sinus tachycardia Possible Left atrial enlargement T wave abnormality, consider lateral ischemia Abnormal ECG T wave inversions laterally  Confirmed by Manus Gunning  MD, STEPHEN ([id number]) on 12/17/2015 7:15:00 PM       Radiology Dg Chest 2 View  Result Date: 12/17/2015 CLINICAL DATA:  Nauseated and anxiety with shortness of breath. EXAM: CHEST  2 VIEW COMPARISON:  None. FINDINGS: The heart size and mediastinal contours are within normal limits. Both lungs are clear. The visualized skeletal structures are unremarkable. IMPRESSION: No active cardiopulmonary disease. Electronically Signed   By: Kennith Center M.D.   On: 12/17/2015 19:26  Ct Head Wo Contrast  Result Date: 12/17/2015 CLINICAL DATA:  Dizziness for the past 5-6 days. History of recent stroke. EXAM: CT HEAD WITHOUT CONTRAST TECHNIQUE: Contiguous axial images were obtained from the base of the skull through the vertex without intravenous contrast. COMPARISON:  11/20/2015; brain MRI - 11/21/2015 FINDINGS: Brain: Minimal encephalomalacia at the site of previous identified left temporal lobe infarct (image 16, series 2). Gray-white differentiation is otherwise well maintained without CT evidence of superimposed acute large territory infarct. No intraparenchymal or extra-axial mass or hemorrhage. Unchanged size and configuration of the ventricles and basilar cisterns. Unchanged appearance of previously characterized arachnoid cysts within the posterior fossa. No midline shift. Vascular: No hyperdense vessel or unexpected calcification. Skull: No displaced calvarial fracture. Sinuses/Orbits: Limited visualization of the paranasal sinuses and mastoid air cells is normal. No air-fluid levels. Other: Regional soft tissues appear normal. IMPRESSION: 1. No acute intracranial process. 2. Evolving encephalomalacia at site of  previous identified left temporal lobe infarct. 3. Unchanged appearance of previously characterized benign arachnoid cysts within the posterior fossa. Electronically Signed   By: Simonne Come M.D.   On: 12/17/2015 20:10  Ct Angio Chest Pe W/cm &/or Wo Cm  Result Date: 12/17/2015 CLINICAL DATA:  Chest pain with exertion. Evaluate for pulmonary embolism. EXAM: CT ANGIOGRAPHY CHEST WITH CONTRAST TECHNIQUE: Multidetector CT imaging of the chest was performed using the standard protocol during bolus administration of intravenous contrast. Multiplanar CT image reconstructions and MIPs were obtained  to evaluate the vascular anatomy. CONTRAST:  100 cc Isovue 370 COMPARISON:  Chest radiograph - earlier same day FINDINGS: Vascular Findings: There is adequate opacification of the pulmonary arterial system with the main pulmonary artery measuring 476 Hounsfield units. There are no discrete filling defects within the pulmonary arterial tree to the level of the bilateral subsegmental pulmonary arteries. Evaluation of the distal subsegmental pulmonary arteries is degraded secondary to patient respiratory artifact. Normal caliber of the main pulmonary artery. Marked cardiomegaly. No pericardial effusion though small amount of fluid is seen tracking to the pericardial recess. Normal caliber of the thoracic aorta though evaluation is markedly degraded secondary to a combination of pulsation artifact as well as streak artifact from contrast within the adjacent pulmonary artery. Bovine configuration of the aortic arch is suspected. Incidental note is made of an azygos nipple. Review of the MIP images confirms the above findings. ---------------------------------------------------------------------------------- Nonvascular Findings: Mediastinum/Lymph Nodes: Mediastinal and right hilar lymphadenopathy with index prevascular lymph node measuring 1.2 cm in greatest short axis diameter (image 28, series 501), index right suprahilar lymph  node measuring 1.5 cm (image 40) and index right infrahilar lymph node measuring approximately 1.8 cm (image 51). No axillary lymphadenopathy. Lungs/Pleura: Evaluation of the pulmonary parenchyma is degraded secondary to patient respiratory artifact. Trace bilateral effusions. Minimal dependent subpleural ground-glass atelectasis. No discrete focal airspace opacities. There is minimal thickening involving the bilateral upper lobe segmental and subsegmental bronchi though the main central pulmonary airways remain widely patent. No discrete pulmonary nodules. Upper abdomen: Limited early arterial phase evaluation of the upper abdomen demonstrates reflux of contrast into the intrahepatic venous system. There is a trace amount perihepatic fluid (image 111, series 501) and fluid seen within the left upper abdominal quadrant (image 87). Musculoskeletal: No acute or aggressive osseous abnormalities. Mild diffuse body wall anasarca. Normal appearance of the thyroid gland. IMPRESSION: 1. No evidence of pulmonary embolism. 2. Marked cardiomegaly with findings worrisome for pulmonary edema including trace bilateral effusions, mild diffuse body wall anasarca and trace amount of fluid seen within the imaged upper abdomen. Additionally, there is reflux of contrast into the intrahepatic venous system which could be indicative of right-sided heart failure. Further evaluation cardiac echo could be performed as clinically indicated. 3. Mediastinal and hilar adenopathy, nonspecific though potentially reactive in etiology. Electronically Signed   By: Simonne Come M.D.   On: 12/17/2015 22:15   Procedures Procedures (including critical care time)  Medications Ordered in ED Medications  LORazepam (ATIVAN) tablet 1 mg (1 mg Oral Given 12/17/15 1858)  meclizine (ANTIVERT) tablet 12.5 mg (12.5 mg Oral Given 12/17/15 1858)  ondansetron (ZOFRAN) tablet 4 mg (4 mg Oral Given 12/17/15 1858)  sodium chloride 0.9 % bolus 500 mL (0 mLs  Intravenous Stopped 12/17/15 2146)  iopamidol (ISOVUE-370) 76 % injection (100 mLs  Contrast Given 12/17/15 2148)  furosemide (LASIX) injection 40 mg (40 mg Intravenous Given 12/17/15 2346)     Initial Impression / Assessment and Plan / ED Course  I have reviewed the triage vital signs and the nursing notes.  Pertinent labs & imaging results that were available during my care of the patient were reviewed by me and considered in my medical decision making (see chart for details).  Clinical Course   30 year old male with recent history of stroke secondary to embolism from dilated cardiomyopathy presenting today for 5-6 days of paroxysmal nocturnal dyspnea, gradually worsening shortness of breath, dizziness, and 2-3 episodes of vomiting.he also had up to 6 episodes of diarrhea daily.  On arrival he was mildly tachycardic with soft blood pressures and was given a 500 mL bolus of normal saline for presumed dehydration with elevated lactate.  Laboratory evaluation then demonstrated significantly elevated BNP (no comparison in our system) and pulmonary edema by chest CT. There is no evidence of pulmonary embolism on CTA of the chest. CT of the head shows no evidence of new intracranial abnormality. His dizziness improved with meclizine. Anxiety improved with Ativan.  With significantly elevated BNP and pulmonary edema on CT the chest she was given 40 mg of IV Lasix. Creatinine bumped at 1.49 from baseline of around 1. He is admitted in fair condition for further evaluation and management of his fluid status.   Final Clinical Impressions(s) / ED Diagnoses   Final diagnoses:  Acute on chronic systolic congestive heart failure (HCC)  Nausea and vomiting, vomiting of unspecified type  HLD (hyperlipidemia)  Gastroesophageal reflux disease without esophagitis    New Prescriptions New Prescriptions   No medications on file     Levora Angel, MD 12/18/15 [id number]    Glynn Octave, MD 12/18/15  [phone]

## 2015-12-17 NOTE — Telephone Encounter (Signed)
Called patient back. Patient continues to have nausea and vomiting over the weekend. Patient stated his BP is still low. Patient was holding his lisinopril due to low BP, and holding his Lipitor at night to see if that was causing his nausea. Patient stated that he has not improved and feels like he is going to pass out. Patient also reports diarrhea. Informed patient to go to ED to be evaluated. Patient is at risk for dehydration due to diarrhea and vomiting. Patient states he thinks it might be his medication. Encouraged patient to go to ED. Patient stated he had someone at home who could take him to the ED now. Patient will go to ED for evaluation.

## 2015-12-17 NOTE — ED Notes (Addendum)
Pt given OK to eat and take night time meds which patient has at bedside; pt verbalizes understanding; family getting patient subway

## 2015-12-17 NOTE — ED Triage Notes (Signed)
Pt here for dizziness and near syncope x 4 days; pt sts had recent stroke; pt sts nausea and vomiting as well

## 2015-12-17 NOTE — ED Notes (Signed)
Engstrom MD at bedside providing pts family with update

## 2015-12-17 NOTE — Telephone Encounter (Signed)
F/u  Pt calling to speak w/ RN- stated that he has been constantly nauseaus and feeling that his heart is racing. Pt stated he wanted to discuss if he should go to ED- wanted to speak w/ RN first. Please call back and discuss.

## 2015-12-17 NOTE — ED Notes (Signed)
Unable to collect stool sample d/t contamination with urine

## 2015-12-17 NOTE — Telephone Encounter (Signed)
New Message:    His medicine was changed over the weekend,he said that you and him were supposed to be going over it.

## 2015-12-18 ENCOUNTER — Encounter (HOSPITAL_COMMUNITY): Payer: Self-pay | Admitting: Internal Medicine

## 2015-12-18 ENCOUNTER — Inpatient Hospital Stay (HOSPITAL_COMMUNITY): Payer: BLUE CROSS/BLUE SHIELD

## 2015-12-18 DIAGNOSIS — E785 Hyperlipidemia, unspecified: Secondary | ICD-10-CM

## 2015-12-18 DIAGNOSIS — R570 Cardiogenic shock: Secondary | ICD-10-CM

## 2015-12-18 DIAGNOSIS — Z8673 Personal history of transient ischemic attack (TIA), and cerebral infarction without residual deficits: Secondary | ICD-10-CM | POA: Diagnosis not present

## 2015-12-18 DIAGNOSIS — Z809 Family history of malignant neoplasm, unspecified: Secondary | ICD-10-CM | POA: Diagnosis not present

## 2015-12-18 DIAGNOSIS — N179 Acute kidney failure, unspecified: Secondary | ICD-10-CM | POA: Diagnosis not present

## 2015-12-18 DIAGNOSIS — I509 Heart failure, unspecified: Secondary | ICD-10-CM | POA: Diagnosis not present

## 2015-12-18 DIAGNOSIS — I420 Dilated cardiomyopathy: Secondary | ICD-10-CM | POA: Diagnosis present

## 2015-12-18 DIAGNOSIS — Z8249 Family history of ischemic heart disease and other diseases of the circulatory system: Secondary | ICD-10-CM | POA: Diagnosis not present

## 2015-12-18 DIAGNOSIS — R740 Nonspecific elevation of levels of transaminase and lactic acid dehydrogenase [LDH]: Secondary | ICD-10-CM | POA: Diagnosis present

## 2015-12-18 DIAGNOSIS — A419 Sepsis, unspecified organism: Secondary | ICD-10-CM | POA: Diagnosis present

## 2015-12-18 DIAGNOSIS — R55 Syncope and collapse: Secondary | ICD-10-CM | POA: Diagnosis present

## 2015-12-18 DIAGNOSIS — F17210 Nicotine dependence, cigarettes, uncomplicated: Secondary | ICD-10-CM | POA: Diagnosis present

## 2015-12-18 DIAGNOSIS — K219 Gastro-esophageal reflux disease without esophagitis: Secondary | ICD-10-CM | POA: Diagnosis present

## 2015-12-18 DIAGNOSIS — I429 Cardiomyopathy, unspecified: Secondary | ICD-10-CM | POA: Diagnosis not present

## 2015-12-18 DIAGNOSIS — Z7901 Long term (current) use of anticoagulants: Secondary | ICD-10-CM | POA: Diagnosis not present

## 2015-12-18 DIAGNOSIS — R109 Unspecified abdominal pain: Secondary | ICD-10-CM | POA: Diagnosis present

## 2015-12-18 DIAGNOSIS — B3324 Viral cardiomyopathy: Secondary | ICD-10-CM | POA: Diagnosis present

## 2015-12-18 DIAGNOSIS — Z79899 Other long term (current) drug therapy: Secondary | ICD-10-CM | POA: Diagnosis not present

## 2015-12-18 DIAGNOSIS — R112 Nausea with vomiting, unspecified: Secondary | ICD-10-CM | POA: Diagnosis present

## 2015-12-18 DIAGNOSIS — I130 Hypertensive heart and chronic kidney disease with heart failure and stage 1 through stage 4 chronic kidney disease, or unspecified chronic kidney disease: Secondary | ICD-10-CM | POA: Diagnosis present

## 2015-12-18 DIAGNOSIS — R1084 Generalized abdominal pain: Secondary | ICD-10-CM | POA: Diagnosis not present

## 2015-12-18 DIAGNOSIS — R7989 Other specified abnormal findings of blood chemistry: Secondary | ICD-10-CM | POA: Diagnosis present

## 2015-12-18 DIAGNOSIS — I5023 Acute on chronic systolic (congestive) heart failure: Secondary | ICD-10-CM | POA: Diagnosis present

## 2015-12-18 DIAGNOSIS — E876 Hypokalemia: Secondary | ICD-10-CM | POA: Diagnosis present

## 2015-12-18 DIAGNOSIS — J811 Chronic pulmonary edema: Secondary | ICD-10-CM | POA: Insufficient documentation

## 2015-12-18 DIAGNOSIS — R197 Diarrhea, unspecified: Secondary | ICD-10-CM

## 2015-12-18 DIAGNOSIS — F909 Attention-deficit hyperactivity disorder, unspecified type: Secondary | ICD-10-CM | POA: Diagnosis present

## 2015-12-18 LAB — CARBOXYHEMOGLOBIN
CARBOXYHEMOGLOBIN: 1.2 % (ref 0.5–1.5)
Carboxyhemoglobin: 0.9 % (ref 0.5–1.5)
METHEMOGLOBIN: 0.8 % (ref 0.0–1.5)
Methemoglobin: 0.6 % (ref 0.0–1.5)
O2 SAT: 66.7 %
O2 Saturation: 30.9 %
TOTAL HEMOGLOBIN: 13.8 g/dL (ref 13.5–18.0)
Total hemoglobin: 15.4 g/dL (ref 13.5–18.0)

## 2015-12-18 LAB — TROPONIN I
TROPONIN I: 0.03 ng/mL — AB (ref <0.03)
Troponin I: 0.03 ng/mL (ref <0.03)

## 2015-12-18 LAB — CBC WITH DIFFERENTIAL/PLATELET
BASOS PCT: 0 %
Basophils Absolute: 0 10*3/uL (ref 0.0–0.1)
EOS ABS: 0.1 10*3/uL (ref 0.0–0.7)
EOS PCT: 1 %
HCT: 43.8 % (ref 39.0–52.0)
Hemoglobin: 14.3 g/dL (ref 13.0–17.0)
LYMPHS ABS: 3.5 10*3/uL (ref 0.7–4.0)
Lymphocytes Relative: 33 %
MCH: 29.7 pg (ref 26.0–34.0)
MCHC: 32.6 g/dL (ref 30.0–36.0)
MCV: 91.1 fL (ref 78.0–100.0)
MONOS PCT: 12 %
Monocytes Absolute: 1.2 10*3/uL — ABNORMAL HIGH (ref 0.1–1.0)
Neutro Abs: 5.9 10*3/uL (ref 1.7–7.7)
Neutrophils Relative %: 54 %
PLATELETS: 229 10*3/uL (ref 150–400)
RBC: 4.81 MIL/uL (ref 4.22–5.81)
RDW: 13.3 % (ref 11.5–15.5)
WBC: 10.7 10*3/uL — AB (ref 4.0–10.5)

## 2015-12-18 LAB — GASTROINTESTINAL PANEL BY PCR, STOOL (REPLACES STOOL CULTURE)
ASTROVIRUS: NOT DETECTED
Adenovirus F40/41: NOT DETECTED
CAMPYLOBACTER SPECIES: NOT DETECTED
Cryptosporidium: NOT DETECTED
Cyclospora cayetanensis: NOT DETECTED
E. coli O157: NOT DETECTED
ENTAMOEBA HISTOLYTICA: NOT DETECTED
ENTEROAGGREGATIVE E COLI (EAEC): NOT DETECTED
ENTEROPATHOGENIC E COLI (EPEC): NOT DETECTED
ENTEROTOXIGENIC E COLI (ETEC): NOT DETECTED
GIARDIA LAMBLIA: NOT DETECTED
NOROVIRUS GI/GII: NOT DETECTED
PLESIMONAS SHIGELLOIDES: NOT DETECTED
Rotavirus A: NOT DETECTED
Salmonella species: NOT DETECTED
Sapovirus (I, II, IV, and V): NOT DETECTED
Shiga like toxin producing E coli (STEC): NOT DETECTED
Shigella/Enteroinvasive E coli (EIEC): NOT DETECTED
VIBRIO CHOLERAE: NOT DETECTED
Vibrio species: NOT DETECTED
Yersinia enterocolitica: NOT DETECTED

## 2015-12-18 LAB — LACTIC ACID, PLASMA
LACTIC ACID, VENOUS: 1.6 mmol/L (ref 0.5–1.9)
Lactic Acid, Venous: 1.7 mmol/L (ref 0.5–1.9)

## 2015-12-18 LAB — BLOOD GAS, VENOUS

## 2015-12-18 LAB — LIPASE, BLOOD: LIPASE: 30 U/L (ref 11–51)

## 2015-12-18 LAB — APTT
APTT: 37 s — AB (ref 24–36)
APTT: 37 s — AB (ref 24–36)

## 2015-12-18 LAB — BASIC METABOLIC PANEL
Anion gap: 10 (ref 5–15)
BUN: 21 mg/dL — AB (ref 6–20)
CALCIUM: 8.7 mg/dL — AB (ref 8.9–10.3)
CHLORIDE: 105 mmol/L (ref 101–111)
CO2: 20 mmol/L — ABNORMAL LOW (ref 22–32)
CREATININE: 1.38 mg/dL — AB (ref 0.61–1.24)
GFR calc non Af Amer: >60 mL/min (ref >60)
Glucose, Bld: 92 mg/dL (ref 65–99)
Potassium: 4 mmol/L (ref 3.5–5.1)
SODIUM: 135 mmol/L (ref 135–145)

## 2015-12-18 LAB — HEPATIC FUNCTION PANEL
ALBUMIN: 3 g/dL — AB (ref 3.5–5.0)
ALK PHOS: 59 U/L (ref 38–126)
ALT: 128 U/L — ABNORMAL HIGH (ref 17–63)
AST: 70 U/L — AB (ref 15–41)
BILIRUBIN TOTAL: 1 mg/dL (ref 0.3–1.2)
Bilirubin, Direct: 0.2 mg/dL (ref 0.1–0.5)
Indirect Bilirubin: 0.8 mg/dL (ref 0.3–0.9)
TOTAL PROTEIN: 5.4 g/dL — AB (ref 6.5–8.1)

## 2015-12-18 LAB — URINALYSIS, ROUTINE W REFLEX MICROSCOPIC
Bilirubin Urine: NEGATIVE
GLUCOSE, UA: NEGATIVE mg/dL
HGB URINE DIPSTICK: NEGATIVE
KETONES UR: NEGATIVE mg/dL
Leukocytes, UA: NEGATIVE
Nitrite: NEGATIVE
PH: 5 (ref 5.0–8.0)
PROTEIN: NEGATIVE mg/dL
Specific Gravity, Urine: 1.037 — ABNORMAL HIGH (ref 1.005–1.030)

## 2015-12-18 LAB — ECHOCARDIOGRAM COMPLETE
HEIGHTINCHES: 71 in
Weight: 3289.6 oz

## 2015-12-18 LAB — PROTIME-INR
INR: 3.19
PROTHROMBIN TIME: 32.1 s — AB (ref 11.4–15.2)

## 2015-12-18 LAB — MRSA PCR SCREENING: MRSA BY PCR: NEGATIVE

## 2015-12-18 LAB — HEPARIN LEVEL (UNFRACTIONATED): Heparin Unfractionated: 1.96 IU/mL — ABNORMAL HIGH (ref 0.30–0.70)

## 2015-12-18 LAB — C DIFFICILE QUICK SCREEN W PCR REFLEX
C DIFFICILE (CDIFF) INTERP: NOT DETECTED
C DIFFICILE (CDIFF) TOXIN: NEGATIVE
C Diff antigen: NEGATIVE

## 2015-12-18 LAB — PROCALCITONIN

## 2015-12-18 LAB — TSH: TSH: 3.501 u[IU]/mL (ref 0.350–4.500)

## 2015-12-18 LAB — MAGNESIUM: MAGNESIUM: 1.9 mg/dL (ref 1.7–2.4)

## 2015-12-18 MED ORDER — SODIUM CHLORIDE 0.9% FLUSH
3.0000 mL | Freq: Two times a day (BID) | INTRAVENOUS | Status: DC
  Administered 2015-12-18 – 2015-12-22 (×7): 3 mL via INTRAVENOUS

## 2015-12-18 MED ORDER — PERFLUTREN LIPID MICROSPHERE
INTRAVENOUS | Status: AC
  Filled 2015-12-18: qty 10

## 2015-12-18 MED ORDER — SODIUM CHLORIDE 0.9% FLUSH: 3.0000 mL | INTRAVENOUS | Status: DC | PRN

## 2015-12-18 MED ORDER — ZOLPIDEM TARTRATE 5 MG PO TABS
5.0000 mg | ORAL_TABLET | Freq: Every evening | ORAL | Status: DC | PRN
  Administered 2015-12-19 – 2015-12-22 (×5): 5 mg via ORAL
  Filled 2015-12-18 (×6): qty 1

## 2015-12-18 MED ORDER — HEPARIN (PORCINE) IN NACL 100-0.45 UNIT/ML-% IJ SOLN
1600.0000 [IU]/h | INTRAMUSCULAR | Status: DC
  Administered 2015-12-18 – 2015-12-19 (×2): 1300 [IU]/h via INTRAVENOUS
  Filled 2015-12-18 (×2): qty 250

## 2015-12-18 MED ORDER — LOPERAMIDE HCL 2 MG PO CAPS: 2.0000 mg | ORAL_CAPSULE | ORAL | Status: DC | PRN

## 2015-12-18 MED ORDER — NICOTINE 21 MG/24HR TD PT24
21.0000 mg | MEDICATED_PATCH | Freq: Every day | TRANSDERMAL | Status: DC
  Filled 2015-12-18 (×4): qty 1

## 2015-12-18 MED ORDER — ADULT MULTIVITAMIN W/MINERALS CH
1.0000 | ORAL_TABLET | Freq: Every day | ORAL | Status: DC
  Administered 2015-12-18 – 2015-12-23 (×6): 1 via ORAL
  Filled 2015-12-18 (×6): qty 1

## 2015-12-18 MED ORDER — LORAZEPAM 1 MG PO TABS: 0.5000 mg | ORAL_TABLET | Freq: Four times a day (QID) | ORAL | Status: DC | PRN

## 2015-12-18 MED ORDER — VITAMIN B-1 100 MG PO TABS
100.0000 mg | ORAL_TABLET | Freq: Every day | ORAL | Status: DC
  Administered 2015-12-18 – 2015-12-23 (×6): 100 mg via ORAL
  Filled 2015-12-18 (×6): qty 1

## 2015-12-18 MED ORDER — FUROSEMIDE 10 MG/ML IJ SOLN
40.0000 mg | Freq: Every day | INTRAMUSCULAR | Status: DC
  Administered 2015-12-18: 40 mg via INTRAVENOUS
  Filled 2015-12-18: qty 4

## 2015-12-18 MED ORDER — ATORVASTATIN CALCIUM 20 MG PO TABS
20.0000 mg | ORAL_TABLET | Freq: Every day | ORAL | Status: DC
  Administered 2015-12-18 – 2015-12-22 (×5): 20 mg via ORAL
  Filled 2015-12-18 (×5): qty 1

## 2015-12-18 MED ORDER — FAMOTIDINE 20 MG PO TABS
20.0000 mg | ORAL_TABLET | Freq: Every day | ORAL | Status: DC
  Administered 2015-12-18 – 2015-12-23 (×6): 20 mg via ORAL
  Filled 2015-12-18 (×6): qty 1

## 2015-12-18 MED ORDER — ONDANSETRON HCL 4 MG PO TABS: 4.0000 mg | ORAL_TABLET | Freq: Three times a day (TID) | ORAL | Status: DC | PRN

## 2015-12-18 MED ORDER — MILRINONE LACTATE IN DEXTROSE 20-5 MG/100ML-% IV SOLN
0.1250 ug/kg/min | INTRAVENOUS | Status: DC
  Administered 2015-12-18 – 2015-12-19 (×2): 0.375 ug/kg/min via INTRAVENOUS
  Administered 2015-12-19 – 2015-12-20 (×2): 0.25 ug/kg/min via INTRAVENOUS
  Administered 2015-12-21: 0.125 ug/kg/min via INTRAVENOUS
  Filled 2015-12-18 (×5): qty 100

## 2015-12-18 MED ORDER — RIVAROXABAN 20 MG PO TABS: 20.0000 mg | ORAL_TABLET | Freq: Every day | ORAL | Status: DC

## 2015-12-18 MED ORDER — FUROSEMIDE 10 MG/ML IJ SOLN
80.0000 mg | Freq: Two times a day (BID) | INTRAMUSCULAR | Status: DC
  Administered 2015-12-18 – 2015-12-19 (×3): 80 mg via INTRAVENOUS
  Filled 2015-12-18 (×4): qty 8

## 2015-12-18 MED ORDER — CARVEDILOL 3.125 MG PO TABS
3.1250 mg | ORAL_TABLET | Freq: Two times a day (BID) | ORAL | Status: DC
  Administered 2015-12-18: 3.125 mg via ORAL
  Filled 2015-12-18: qty 1

## 2015-12-18 MED ORDER — SODIUM CHLORIDE 0.9% FLUSH: 10.0000 mL | INTRAVENOUS | Status: DC | PRN

## 2015-12-18 MED ORDER — SODIUM CHLORIDE 0.9 % IV SOLN: 250.0000 mL | INTRAVENOUS | Status: DC | PRN

## 2015-12-18 MED ORDER — FUROSEMIDE 10 MG/ML IJ SOLN
80.0000 mg | Freq: Once | INTRAMUSCULAR | Status: AC
  Administered 2015-12-18: 80 mg via INTRAVENOUS
  Filled 2015-12-18: qty 8

## 2015-12-18 MED ORDER — DIGOXIN 125 MCG PO TABS
0.1250 mg | ORAL_TABLET | Freq: Every day | ORAL | Status: DC
  Administered 2015-12-18 – 2015-12-23 (×6): 0.125 mg via ORAL
  Filled 2015-12-18 (×6): qty 1

## 2015-12-18 MED ORDER — SODIUM CHLORIDE 0.9% FLUSH
10.0000 mL | Freq: Two times a day (BID) | INTRAVENOUS | Status: DC
  Administered 2015-12-18: 40 mL
  Administered 2015-12-18 – 2015-12-22 (×5): 10 mL
  Administered 2015-12-22: 20 mL
  Administered 2015-12-23: 10 mL

## 2015-12-18 MED ORDER — DOXYLAMINE SUCCINATE (SLEEP) 25 MG PO TABS: 25.0000 mg | ORAL_TABLET | Freq: Every evening | ORAL | Status: DC | PRN

## 2015-12-18 MED ORDER — ACETAMINOPHEN 325 MG PO TABS: 650.0000 mg | ORAL_TABLET | ORAL | Status: DC | PRN

## 2015-12-18 MED ORDER — PERFLUTREN LIPID MICROSPHERE
1.0000 mL | INTRAVENOUS | Status: AC | PRN
  Administered 2015-12-18: 2 mL via INTRAVENOUS
  Filled 2015-12-18: qty 10

## 2015-12-18 MED ORDER — ALPRAZOLAM 0.25 MG PO TABS
0.2500 mg | ORAL_TABLET | Freq: Two times a day (BID) | ORAL | Status: DC | PRN
  Administered 2015-12-18 – 2015-12-22 (×6): 0.25 mg via ORAL
  Filled 2015-12-18 (×6): qty 1

## 2015-12-18 MED ORDER — FOLIC ACID 1 MG PO TABS
1.0000 mg | ORAL_TABLET | Freq: Every day | ORAL | Status: DC
Start: 2015-12-18 — End: 2015-12-23
  Administered 2015-12-18 – 2015-12-23 (×6): 1 mg via ORAL
  Filled 2015-12-18 (×6): qty 1

## 2015-12-18 NOTE — Progress Notes (Signed)
Peripherally Inserted Central Catheter/Midline Placement  The IV Nurse has discussed with the patient and/or persons authorized to consent for the patient, the purpose of this procedure and the potential benefits and risks involved with this procedure.  The benefits include less needle sticks, lab draws from the catheter and patient may be discharged home with the catheter.  Risks include, but not limited to, infection, bleeding, blood clot (thrombus formation), and puncture of an artery; nerve damage and irregular heat beat.  Alternatives to this procedure were also discussed.  PICC/Midline Placement Documentation        Lisabeth Devoid 12/18/2015, 5:07 PM

## 2015-12-18 NOTE — Progress Notes (Signed)
PROGRESS NOTE    Austin Tran  ZOX:[id number] DOB: [birth year] DOA: 12/17/2015 PCP: Gaspar Garbe, MD    Brief Narrative:  Austin Tran is a 30 y.o. male with medical history significant of systolic congestive heart failure with EF 20-25 percent, tobacco abuse, recently diagnosed stroke, GERD, ADHD, who presents with nausea, vomiting, diarrhea, abdominal pain, dizziness, near syncope and shortness of breath.  Patient reports that he has been having nausea, vomiting, diarrhea, abdominal pain for about 7 days. He has 5-6 times of watery diarrhea each day, vomited while twice each day without blood in the vomitus. He also has abdominal pain, which is located in epigastric area and RUQ. It is constant, 2 out of 10 in series 2 currently, nonradiating. Patient states that he has worsening shortness of breath, which is worse when lying down. Has dry cough, no chest pain, fever or chills. Denies symptoms of UTI or new unilateral weakness. No vision change or hearing loss. Pt states that he has not taken his lasix for about 7 days.   ED Course: pt was found to have WBC 15.3, lactate of 2.40-->1.74, negative urinalysis, BNP 2890, lipase 30, temperature normal, tachycardia, tachypnea, AKi with cre 1.42, negative chest x-ray for infiltration, negative CT head for acute intracranial abnormalities. CTA of chest is negative for PE, but showed pulmonary edema. Pt is admitted to tele bed as inpt.    Assessment & Plan:   Principal Problem:   Acute on chronic systolic (congestive) heart failure (HCC) Active Problems:   Attention deficit hyperactivity disorder (ADHD)   CVA (cerebral infarction)   Congestive dilated cardiomyopathy (HCC)   HLD (hyperlipidemia)   GERD (gastroesophageal reflux disease)   Elevated lactic acid level   Near syncope   Abdominal pain   Nausea, vomiting and diarrhea   Sepsis (HCC)   Gastroesophageal reflux disease without esophagitis  #1 acute on chronic systolic heart  failure Questionable etiology. Patient had nausea vomiting and diarrhea and held his diuretics and ACE inhibitor. Cardiac enzymes with minimally elevated troponins 0.03. 2-D echo with worsening ejection fraction currently now 10-15% with diffuse hypokinesis, grade 3 diastolic dysfunction, prior EF 20-25%. Patient is -790 mL over the past 24 hours. Continue IV diuretics of Lasix. Continue to hold ACE inhibitor. Consult with cardiology/heart failure team for further evaluation and management.  #2 nausea vomiting diarrhea abdominal pain Questionable etiology. Could be a viral gastroenteritis versus worsening heart failure. Patient still with watery loose stools. Lipase is normal.C. Difficile PCR negative. GI pathogen panel pending. Symptomatic treatment. Imodium as needed. Supportive care.  #3 transaminitis Likely secondary to problem #1. Check an acute hepatitis panel. Follow.  #4 near syncope and dizziness Likely secondary to nausea vomiting diarrhea and problem #1 with low cardiac output. CT head negative. CT angiogram chest negative for PE. Follow.  #5 history of stroke Continue Xarelto for secondary stroke prevention.  #6 hyperlipidemia Continue Lipitor.  #7 gastroesophageal reflux disease IV Pepcid.  #8 acute kidney injury Likely secondary to cardiorenal syndrome. Continue to hold ACE inhibitor as an NSAIDs. Continue diuresis with IV Lasix. Follow.  #9 tobacco abuse Nicotine patch.   DVT prophylaxis: xarelto Code Status: Full Family Communication: Updated patient and parents at bedside. Disposition Plan: Per cardiology.   Consultants:   Cardiology: Heart failure team Dr. Gala Romney 12/18/2015  Procedures:   2-D echo 12/18/2015  CT angiogram chest 12/17/2015  Chest x-ray 12/17/2015  Antimicrobials:   None   Subjective: Patient still with shortness of breath. Patient denies  any chest pain. Patient denies any nausea or emesis. Patient states had one episode of loose  stool today.  Objective: Vitals:   12/18/15 0100 12/18/15 0115 12/18/15 0220 12/18/15 0418  BP: 110/84 104/85 114/80 94/60  Pulse: 96 99 100 90  Resp: (!) 52 24 (!) 22 18  Temp:   97.7 F (36.5 C) 97.6 F (36.4 C)  TempSrc:   Axillary Oral  SpO2: 90% 94% 100% 98%  Weight:   93.3 kg (205 lb 9.6 oz)   Height:   5\' 11"  (1.803 m)     Intake/Output Summary (Last 24 hours) at 12/18/15 1224 Last data filed at 12/18/15 0210  Gross per 24 hour  Intake              120 ml  Output              750 ml  Net             -630 ml   Filed Weights   12/18/15 0220  Weight: 93.3 kg (205 lb 9.6 oz)    Examination:  General exam: Sleeping, easily arousable. Pallor Respiratory system: Decreased breath sounds in the bases. No wheezing.  Cardiovascular system: S1 & S2 heard, RRR. + JVD, murmurs, rubs, gallops or clicks. 1+ bilateral lower extremity edema Gastrointestinal system: Abdomen is nondistended, soft and nontender. No organomegaly or masses felt. Normal bowel sounds heard. Central nervous system: Drowsy however, easily arousable. Answering questions appropriately. Alert and oriented. No focal neurological deficits. Extremities: Symmetric 5 x 5 power. Skin: No rashes, lesions or ulcers Psychiatry: Judgement and insight appear normal. Mood & affect appropriate.     Data Reviewed: I have personally reviewed following labs and imaging studies  CBC:  Recent Labs Lab 12/17/15 1802 12/18/15 0755  WBC 15.3* 10.7*  NEUTROABS  --  5.9  HGB 15.9 14.3  HCT 48.6 43.8  MCV 89.8 91.1  PLT 305 229   Basic Metabolic Panel:  Recent Labs Lab 12/17/15 1802 12/18/15 0755  NA 137 135  K 4.3 4.0  CL 109 105  CO2 17* 20*  GLUCOSE 131* 92  BUN 22* 21*  CREATININE 1.42* 1.38*  CALCIUM 9.1 8.7*  MG  --  1.9   GFR: Estimated Creatinine Clearance: 90.5 mL/min (by C-G formula based on SCr of 1.38 mg/dL). Liver Function Tests:  Recent Labs Lab 12/18/15 0226  AST 70*  ALT 128*    ALKPHOS 59  BILITOT 1.0  PROT 5.4*  ALBUMIN 3.0*    Recent Labs Lab 12/18/15 0151  LIPASE 30   No results for input(s): AMMONIA in the last 168 hours. Coagulation Profile:  Recent Labs Lab 12/18/15 0226  INR 3.19   Cardiac Enzymes:  Recent Labs Lab 12/18/15 0755  TROPONINI 0.03*   BNP (last 3 results) No results for input(s): PROBNP in the last 8760 hours. HbA1C: No results for input(s): HGBA1C in the last 72 hours. CBG: No results for input(s): GLUCAP in the last 168 hours. Lipid Profile: No results for input(s): CHOL, HDL, LDLCALC, TRIG, CHOLHDL, LDLDIRECT in the last 72 hours. Thyroid Function Tests: No results for input(s): TSH, T4TOTAL, FREET4, T3FREE, THYROIDAB in the last 72 hours. Anemia Panel: No results for input(s): VITAMINB12, FOLATE, FERRITIN, TIBC, IRON, RETICCTPCT in the last 72 hours. Sepsis Labs:  Recent Labs Lab 12/17/15 1947 12/17/15 2148 12/18/15 0150 12/18/15 0151 12/18/15 0226  PROCALCITON  --   --   --  <0.10  --   LATICACIDVEN 2.40* 1.74  1.6  --  1.7    Recent Results (from the past 240 hour(s))  C difficile quick scan w PCR reflex     Status: None   Collection Time: 12/18/15 12:28 AM  Result Value Ref Range Status   C Diff antigen NEGATIVE NEGATIVE Final   C Diff toxin NEGATIVE NEGATIVE Final   C Diff interpretation No C. difficile detected.  Final         Radiology Studies: Dg Chest 2 View  Result Date: 12/17/2015 CLINICAL DATA:  Nauseated and anxiety with shortness of breath. EXAM: CHEST  2 VIEW COMPARISON:  None. FINDINGS: The heart size and mediastinal contours are within normal limits. Both lungs are clear. The visualized skeletal structures are unremarkable. IMPRESSION: No active cardiopulmonary disease. Electronically Signed   By: Kennith Center M.D.   On: 12/17/2015 19:26  Ct Head Wo Contrast  Result Date: 12/17/2015 CLINICAL DATA:  Dizziness for the past 5-6 days. History of recent stroke. EXAM: CT HEAD WITHOUT  CONTRAST TECHNIQUE: Contiguous axial images were obtained from the base of the skull through the vertex without intravenous contrast. COMPARISON:  11/20/2015; brain MRI - 11/21/2015 FINDINGS: Brain: Minimal encephalomalacia at the site of previous identified left temporal lobe infarct (image 16, series 2). Gray-white differentiation is otherwise well maintained without CT evidence of superimposed acute large territory infarct. No intraparenchymal or extra-axial mass or hemorrhage. Unchanged size and configuration of the ventricles and basilar cisterns. Unchanged appearance of previously characterized arachnoid cysts within the posterior fossa. No midline shift. Vascular: No hyperdense vessel or unexpected calcification. Skull: No displaced calvarial fracture. Sinuses/Orbits: Limited visualization of the paranasal sinuses and mastoid air cells is normal. No air-fluid levels. Other: Regional soft tissues appear normal. IMPRESSION: 1. No acute intracranial process. 2. Evolving encephalomalacia at site of previous identified left temporal lobe infarct. 3. Unchanged appearance of previously characterized benign arachnoid cysts within the posterior fossa. Electronically Signed   By: Simonne Come M.D.   On: 12/17/2015 20:10  Ct Angio Chest Pe W/cm &/or Wo Cm  Result Date: 12/17/2015 CLINICAL DATA:  Chest pain with exertion. Evaluate for pulmonary embolism. EXAM: CT ANGIOGRAPHY CHEST WITH CONTRAST TECHNIQUE: Multidetector CT imaging of the chest was performed using the standard protocol during bolus administration of intravenous contrast. Multiplanar CT image reconstructions and MIPs were obtained to evaluate the vascular anatomy. CONTRAST:  100 cc Isovue 370 COMPARISON:  Chest radiograph - earlier same day FINDINGS: Vascular Findings: There is adequate opacification of the pulmonary arterial system with the main pulmonary artery measuring 476 Hounsfield units. There are no discrete filling defects within the pulmonary  arterial tree to the level of the bilateral subsegmental pulmonary arteries. Evaluation of the distal subsegmental pulmonary arteries is degraded secondary to patient respiratory artifact. Normal caliber of the main pulmonary artery. Marked cardiomegaly. No pericardial effusion though small amount of fluid is seen tracking to the pericardial recess. Normal caliber of the thoracic aorta though evaluation is markedly degraded secondary to a combination of pulsation artifact as well as streak artifact from contrast within the adjacent pulmonary artery. Bovine configuration of the aortic arch is suspected. Incidental note is made of an azygos nipple. Review of the MIP images confirms the above findings. ---------------------------------------------------------------------------------- Nonvascular Findings: Mediastinum/Lymph Nodes: Mediastinal and right hilar lymphadenopathy with index prevascular lymph node measuring 1.2 cm in greatest short axis diameter (image 28, series 501), index right suprahilar lymph node measuring 1.5 cm (image 40) and index right infrahilar lymph node measuring approximately 1.8 cm (image  51). No axillary lymphadenopathy. Lungs/Pleura: Evaluation of the pulmonary parenchyma is degraded secondary to patient respiratory artifact. Trace bilateral effusions. Minimal dependent subpleural ground-glass atelectasis. No discrete focal airspace opacities. There is minimal thickening involving the bilateral upper lobe segmental and subsegmental bronchi though the main central pulmonary airways remain widely patent. No discrete pulmonary nodules. Upper abdomen: Limited early arterial phase evaluation of the upper abdomen demonstrates reflux of contrast into the intrahepatic venous system. There is a trace amount perihepatic fluid (image 111, series 501) and fluid seen within the left upper abdominal quadrant (image 87). Musculoskeletal: No acute or aggressive osseous abnormalities. Mild diffuse body wall  anasarca. Normal appearance of the thyroid gland. IMPRESSION: 1. No evidence of pulmonary embolism. 2. Marked cardiomegaly with findings worrisome for pulmonary edema including trace bilateral effusions, mild diffuse body wall anasarca and trace amount of fluid seen within the imaged upper abdomen. Additionally, there is reflux of contrast into the intrahepatic venous system which could be indicative of right-sided heart failure. Further evaluation cardiac echo could be performed as clinically indicated. 3. Mediastinal and hilar adenopathy, nonspecific though potentially reactive in etiology. Electronically Signed   By: Simonne Come M.D.   On: 12/17/2015 22:15       Scheduled Meds: . atorvastatin  20 mg Oral q1800  . digoxin  0.125 mg Oral Daily  . famotidine  20 mg Oral Daily  . folic acid  1 mg Oral Daily  . furosemide  80 mg Intravenous BID  . multivitamin with minerals  1 tablet Oral Daily  . nicotine  21 mg Transdermal Daily  . perflutren lipid microspheres (DEFINITY) IV suspension      . rivaroxaban  20 mg Oral Q supper  . sodium chloride flush  3 mL Intravenous Q12H  . thiamine  100 mg Oral Daily   Continuous Infusions:    LOS: 0 days    Time spent: 35 minutes    THOMPSON,DANIEL, MD [facility] Pager [phone]  If 7PM-7AM, please contact night-coverage [email or web address] Password [facility] 12/18/2015, 12:24 PM

## 2015-12-18 NOTE — Progress Notes (Signed)
0U54 Felty: Critical lab call troponin 0.03, MD made aware no orders received at this time. Will continue to monitor pt. George Hugh RN

## 2015-12-18 NOTE — Progress Notes (Signed)
OT Cancellation Note  Patient Details Name: RAMARI REOME MRN: [id number] DOB: 28-Oct-1985   Cancelled Treatment:    Reason Eval/Treat Not Completed: Patient at procedure or test/ unavailable. Pt transferred to [facility] and is currently getting PICC line placed. Will attempt OT evaluation tomorrow as schedule allows.   Nils Pyle, OTR/L Pager: [phone] 12/18/2015, 4:26 PM

## 2015-12-18 NOTE — Progress Notes (Signed)
ANTICOAGULATION CONSULT NOTE - Initial Consult  Pharmacy Consult for Heparin Indication: history of stroke  No Known Allergies  Patient Measurements: Height: 5\' 11"  (180.3 cm) Weight: 205 lb 9.6 oz (93.3 kg) IBW/kg (Calculated) : 75.3 Heparin Dosing Weight: 93 kg  Vital Signs: Temp: 97.6 F (36.4 C) (07/26 0418) Temp Source: Oral (07/26 0418) BP: 110/92 (07/26 1500) Pulse Rate: 94 (07/26 1515)  Labs:  Recent Labs  12/17/15 1802 12/18/15 0226 12/18/15 0755 12/18/15 1323 12/18/15 1746  HGB 15.9  --  14.3  --   --   HCT 48.6  --  43.8  --   --   PLT 305  --  229  --   --   APTT  --  37*  --   --  37*  LABPROT  --  32.1*  --   --   --   INR  --  3.19  --   --   --   CREATININE 1.42*  --  1.38*  --   --   TROPONINI  --   --  0.03* 0.03*  --     Estimated Creatinine Clearance: 90.5 mL/min (by C-G formula based on SCr of 1.38 mg/dL).   Medical History: Past Medical History:  Diagnosis Date  . ADHD (attention deficit hyperactivity disorder)    on adderall since 2004  . Chronic systolic (congestive) heart failure (HCC)   . GERD (gastroesophageal reflux disease)   . HLD (hyperlipidemia)   . Hypertension   . Stroke (HCC)   . Tobacco abuse     Medications:  Prescriptions Prior to Admission  Medication Sig Dispense Refill Last Dose  . carvedilol (COREG) 6.25 MG tablet Take 1 tablet (6.25 mg total) by mouth 2 (two) times daily with a meal. (Patient taking differently: Take 3.125 mg by mouth 2 (two) times daily with a meal. ) 60 tablet 0 12/17/2015 at 0800  . doxylamine, Sleep, (UNISOM) 25 MG tablet Take 25 mg by mouth at bedtime as needed for sleep.   12/16/2015 at Unknown time  . folic acid (FOLVITE) 1 MG tablet Take 1 tablet (1 mg total) by mouth daily. 30 tablet 0 12/17/2015 at Unknown time  . Multiple Vitamin (MULTIVITAMIN WITH MINERALS) TABS tablet Take 1 tablet by mouth daily.   12/17/2015 at Unknown time  . rivaroxaban (XARELTO) 20 MG TABS tablet Take 1 tablet (20  mg total) by mouth daily with supper. 30 tablet 11 12/16/2015 at Unknown time  . thiamine 100 MG tablet Take 1 tablet (100 mg total) by mouth daily. 30 tablet 0 12/17/2015 at Unknown time  . atorvastatin (LIPITOR) 20 MG tablet Take 1 tablet (20 mg total) by mouth daily at 6 PM. (Patient not taking: Reported on 12/17/2015) 30 tablet 0 Not Taking at Unknown time  . furosemide (LASIX) 20 MG tablet Take 1 tablet (20 mg total) by mouth daily. 30 tablet 11 Taking  . lisinopril (PRINIVIL,ZESTRIL) 5 MG tablet Take 1 tablet (5 mg total) by mouth daily. (Patient not taking: Reported on 12/17/2015) 30 tablet 0 Not Taking at Unknown time  . pantoprazole (PROTONIX) 40 MG tablet Take 1 tablet (40 mg total) by mouth daily. 30 tablet 11    Scheduled:  . atorvastatin  20 mg Oral q1800  . digoxin  0.125 mg Oral Daily  . famotidine  20 mg Oral Daily  . folic acid  1 mg Oral Daily  . furosemide  80 mg Intravenous BID  . multivitamin with minerals  1  tablet Oral Daily  . nicotine  21 mg Transdermal Daily  . perflutren lipid microspheres (DEFINITY) IV suspension      . sodium chloride flush  3 mL Intravenous Q12H  . thiamine  100 mg Oral Daily   Infusions:    Assessment: 30yo male with history of stroke, CHF, GERD and tobacco use presents with N/V/D, syncope and SOB. Pharmacy is consulted to dose heparin for h/o stroke.   Pt last took xarelto 12/16/15. Will be adjusting based on aPTT.  Goal of Therapy:  Heparin level 0.3-0.7 units/ml aPTT 66-102 seconds Monitor platelets by anticoagulation protocol: Yes   Plan:  Start heparin infusion at 1300 units/hr Check anti-Xa level in 8 hours and daily while on heparin Continue to monitor H&H and platelets  Arlean Hopping. Newman Pies, PharmD, BCPS Clinical Pharmacist Pager [phone] 12/18/2015,3:37 PM

## 2015-12-18 NOTE — Progress Notes (Signed)
Echocardiogram 2D Echocardiogram has been performed with definity.  Marisue Humble 12/18/2015, 10:50 AM

## 2015-12-18 NOTE — Progress Notes (Signed)
2I29 Prisk: Pt transferred to 2 Heart per MD order. Pt alert and oriented, 2 liters of O2, no complaints of pain upon transfer to 2 Heart. Family escorted along with pt for transfer. Report given to receiving nurse. George Hugh RN

## 2015-12-18 NOTE — Consult Note (Addendum)
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 Drinks 4-5 beers a week.     In June 2017, cardioembolic stroke. Echocardiogram obtained on 11/21/2015 however showed EF 20-25%, severe dilatation of the left ventricle, mild MR, moderate left atrial enlargement, moderate RV enlargement with reduced RV function. Cardiology was consulted for TEE in the setting of stroke and also LV dysfunction. TEE obtained on 11/22/2015 showed severe LV dysfunction with EF 15%, severe RV dysfunction.  On July 7 he was seen in [facility] clinic for HF follow. Having RUQ pain. Sent for ultrasound with abdomen with mild gall bladder thickening but no stones.  HF meds were not changed- carvedilol 6.25 mg twice a day, lisinopril 5 mg daily, and lasix 20 mg daily.   Over the weekend he had N/V/D so he held lipitor, lasix, and lisinopril. Also described PND and orthopnea. Sleeping sitting up right. Weight at home 203-240 pounds. Yesterday continued to have N/V/D so he was instructed to go to [facility] ED for further evaluation. He was later admitted.    Admitted with N/V/diarrhea/presyncope/dyspnea.  CT angio - negative for PE. CXR no acute findings. C diff negative. Diuresing with IV lasix. Today he is complaining of fatigue and mild nausea. No abdominal pain but feels bloated. Earlier today says he had blood in his stool. Denies lower extremity edema.   Pertinent admission labs include BNP 2890, lactic acid 2.4, procalcitonin 0.10, Albumin 3.0, AST 70, ALT 128, Creatinine 1.42, K 4.0, WBC 10.7, and troponin 0.03.    Review of Systems: [y] = yes, [ ]  = no   General: Weight gain [ ] ; Weight loss [ ] ; Anorexia [ ] ; Fatigue [ Y]; Fever [ ] ; Chills [ ] ;  Weakness [Y ]  Cardiac: Chest pain/pressure [ ] ; Resting SOB [ ] ; Exertional SOB [Y ]; Orthopnea [ ] ; Pedal Edema [ ] ; Palpitations [ ] ; Syncope [ ] ; Presyncope [Y ]; Paroxysmal nocturnal dyspnea[Y ]  Pulmonary: Cough [ ] ; Wheezing[ ] ; Hemoptysis[ ] ; Sputum [ ] ; Snoring [ ]   GI: Vomiting[ ] ; Dysphagia[ ] ; Melena[ ] ; Hematochezia [ ] ; Heartburn[ ] ; Abdominal pain [ ] ; Constipation [ ] ; Diarrhea [ ] ; BRBPR [ ]   GU: Hematuria[ ] ; Dysuria [ ] ; Nocturia[ ]   Vascular: Pain in legs with walking [ ] ; Pain in feet with lying flat [ ] ; Non-healing sores [ ] ; Stroke [ ] ; TIA [ ] ; Slurred speech [ ] ;  Neuro: Headaches[ ] ; Vertigo[ ] ; Seizures[ ] ; Paresthesias[ ] ;Blurred vision [ ] ; Diplopia [ ] ; Vision changes [ ]   Ortho/Skin: Arthritis [ ] ; Joint pain [ ] ; Muscle pain [ ] ; Joint swelling [ ] ; Back Pain [ ] ; Rash [ ]   Psych: Depression[ ] ; Anxiety[ ]   Heme: Bleeding problems [ ] ; Clotting disorders [ ] ; Anemia [ ]   Endocrine: Diabetes [ ] ; Thyroid dysfunction[ ]   Home Medications Prior to Admission medications   Medication Sig Start Date End Date Taking? Authorizing Provider  carvedilol (COREG) 6.25 MG tablet Take 1 tablet (6.25 mg total) by mouth 2 (two) times daily with a meal. Patient taking differently: Take 3.125 mg by mouth 2 (two) times daily with a meal.  11/23/15  Yes Elease Etienne, MD  doxylamine, Sleep, (UNISOM) 25  MG tablet Take 25 mg by mouth at bedtime as needed for sleep.   Yes Historical Provider, MD  folic acid (FOLVITE) 1 MG tablet Take 1 tablet (1 mg total) by mouth daily. 11/23/15  Yes Elease Etienne, MD  Multiple Vitamin (MULTIVITAMIN WITH MINERALS) TABS tablet Take 1 tablet by mouth daily. 11/23/15  Yes Elease Etienne, MD  rivaroxaban (XARELTO) 20 MG TABS tablet Take 1 tablet (20 mg total) by mouth daily with supper. 11/25/15  Yes Wendall Stade, MD  thiamine 100 MG tablet Take 1 tablet (100 mg total) by mouth daily. 11/23/15  Yes Elease Etienne, MD  atorvastatin (LIPITOR) 20 MG tablet  Take 1 tablet (20 mg total) by mouth daily at 6 PM. Patient not taking: Reported on 12/17/2015 11/23/15   Elease Etienne, MD  furosemide (LASIX) 20 MG tablet Take 1 tablet (20 mg total) by mouth daily. 11/25/15   Wendall Stade, MD  lisinopril (PRINIVIL,ZESTRIL) 5 MG tablet Take 1 tablet (5 mg total) by mouth daily. Patient not taking: Reported on 12/17/2015 11/23/15   Elease Etienne, MD  pantoprazole (PROTONIX) 40 MG tablet Take 1 tablet (40 mg total) by mouth daily. 11/29/15   Azalee Course, PA    Past Medical History: Past Medical History:  Diagnosis Date  . ADHD (attention deficit hyperactivity disorder)    on adderall since 2004  . Chronic systolic (congestive) heart failure (HCC)   . GERD (gastroesophageal reflux disease)   . HLD (hyperlipidemia)   . Hypertension   . Stroke (HCC)   . Tobacco abuse     Past Surgical History: Past Surgical History:  Procedure Laterality Date  . EYE SURGERY    . KNEE SURGERY    . TEE WITHOUT CARDIOVERSION N/A 11/22/2015   Procedure: TRANSESOPHAGEAL ECHOCARDIOGRAM (TEE);  Surgeon: Lewayne Bunting, MD;  Location: [facility] ENDOSCOPY;  Service: Cardiovascular;  Laterality: N/A;    Family History: Family History  Problem Relation Age of Onset  . Hypertension Mother   . Hypertension Father   . Cancer Maternal Grandmother     Social History: Social History   Social History  . Marital status: Single    Spouse name: N/A  . Number of children: N/A  . Years of education: N/A   Social History Main Topics  . Smoking status: Current Some Day Smoker    Types: Cigarettes  . Smokeless tobacco: Never Used  . Alcohol use 7.2 oz/week    12 Cans of beer per week     Comment: drink 4-5 beers a night, more on the weekend  . Drug use: No  . Sexual activity: Not Asked   Other Topics Concern  . None   Social History Narrative  . None    Allergies:  No Known Allergies  Objective:    Vital Signs:   Temp:  [97.3 F (36.3 C)-97.8 F (36.6 C)] 97.6 F  (36.4 C) (07/26 0418) Pulse Rate:  [75-110] 90 (07/26 0418) Resp:  [18-52] 18 (07/26 0418) BP: (94-123)/(60-100) 94/60 (07/26 0418) SpO2:  [90 %-100 %] 98 % (07/26 0418) Weight:  [205 lb 9.6 oz (93.3 kg)] 205 lb 9.6 oz (93.3 kg) (07/26 0220) Last BM Date: 12/18/15  Weight change: Filed Weights   12/18/15 0220  Weight: 205 lb 9.6 oz (93.3 kg)    Intake/Output:   Intake/Output Summary (Last 24 hours) at 12/18/15 1003 Last data filed at 12/18/15 0210  Gross per 24 hour  Intake  120 ml  Output              750 ml  Net             -630 ml     Physical Exam: General:  Fatigued appearing. Pale. + dyspneic  Mom and Dad at bedside.  HEENT: normal Neck: supple. JVP to jaw. Carotids 2+ bilat; no bruits. No lymphadenopathy or thryomegaly appreciated. Cor: PMI nondisplaced. Regular rate & rhythm. No rubs, or murmurs. +S3  Lungs: clear Abdomen: soft, nontender, distended. No hepatosplenomegaly. No bruits or masses. Good bowel sounds. Extremities: no cyanosis, clubbing, rash, edema R and LLE cool Neuro: alert & orientedx3, cranial nerves grossly intact. moves all 4 extremities w/o difficulty. Affect pleasant  Telemetry: SR 90s   Labs: Basic Metabolic Panel:  Recent Labs Lab 12/17/15 1802 12/18/15 0755  NA 137 135  K 4.3 4.0  CL 109 105  CO2 17* 20*  GLUCOSE 131* 92  BUN 22* 21*  CREATININE 1.42* 1.38*  CALCIUM 9.1 8.7*  MG  --  1.9    Liver Function Tests:  Recent Labs Lab 12/18/15 0226  AST 70*  ALT 128*  ALKPHOS 59  BILITOT 1.0  PROT 5.4*  ALBUMIN 3.0*    Recent Labs Lab 12/18/15 0151  LIPASE 30   No results for input(s): AMMONIA in the last 168 hours.  CBC:  Recent Labs Lab 12/17/15 1802 12/18/15 0755  WBC 15.3* 10.7*  NEUTROABS  --  5.9  HGB 15.9 14.3  HCT 48.6 43.8  MCV 89.8 91.1  PLT 305 229    Cardiac Enzymes:  Recent Labs Lab 12/18/15 0755  TROPONINI 0.03*    BNP: BNP (last 3 results)  Recent Labs   12/17/15 1938  BNP 2,890.3*    ProBNP (last 3 results) No results for input(s): PROBNP in the last 8760 hours.   CBG: No results for input(s): GLUCAP in the last 168 hours.  Coagulation Studies:  Recent Labs  12/18/15 0226  LABPROT 32.1*  INR 3.19    Other results: YQM:VHQIO Tach 115 bpm No ST-T wave abnormalities.  QRS 92ms  Imaging: Dg Chest 2 View  Result Date: 12/17/2015 CLINICAL DATA:  Nauseated and anxiety with shortness of breath. EXAM: CHEST  2 VIEW COMPARISON:  None. FINDINGS: The heart size and mediastinal contours are within normal limits. Both lungs are clear. The visualized skeletal structures are unremarkable. IMPRESSION: No active cardiopulmonary disease. Electronically Signed   By: Kennith Center M.D.   On: 12/17/2015 19:26  Ct Head Wo Contrast  Result Date: 12/17/2015 CLINICAL DATA:  Dizziness for the past 5-6 days. History of recent stroke. EXAM: CT HEAD WITHOUT CONTRAST TECHNIQUE: Contiguous axial images were obtained from the base of the skull through the vertex without intravenous contrast. COMPARISON:  11/20/2015; brain MRI - 11/21/2015 FINDINGS: Brain: Minimal encephalomalacia at the site of previous identified left temporal lobe infarct (image 16, series 2). Gray-white differentiation is otherwise well maintained without CT evidence of superimposed acute large territory infarct. No intraparenchymal or extra-axial mass or hemorrhage. Unchanged size and configuration of the ventricles and basilar cisterns. Unchanged appearance of previously characterized arachnoid cysts within the posterior fossa. No midline shift. Vascular: No hyperdense vessel or unexpected calcification. Skull: No displaced calvarial fracture. Sinuses/Orbits: Limited visualization of the paranasal sinuses and mastoid air cells is normal. No air-fluid levels. Other: Regional soft tissues appear normal. IMPRESSION: 1. No acute intracranial process. 2. Evolving encephalomalacia at site of previous  identified left temporal lobe  infarct. 3. Unchanged appearance of previously characterized benign arachnoid cysts within the posterior fossa. Electronically Signed   By: Simonne Come M.D.   On: 12/17/2015 20:10  Ct Angio Chest Pe W/cm &/or Wo Cm  Result Date: 12/17/2015 CLINICAL DATA:  Chest pain with exertion. Evaluate for pulmonary embolism. EXAM: CT ANGIOGRAPHY CHEST WITH CONTRAST TECHNIQUE: Multidetector CT imaging of the chest was performed using the standard protocol during bolus administration of intravenous contrast. Multiplanar CT image reconstructions and MIPs were obtained to evaluate the vascular anatomy. CONTRAST:  100 cc Isovue 370 COMPARISON:  Chest radiograph - earlier same day FINDINGS: Vascular Findings: There is adequate opacification of the pulmonary arterial system with the main pulmonary artery measuring 476 Hounsfield units. There are no discrete filling defects within the pulmonary arterial tree to the level of the bilateral subsegmental pulmonary arteries. Evaluation of the distal subsegmental pulmonary arteries is degraded secondary to patient respiratory artifact. Normal caliber of the main pulmonary artery. Marked cardiomegaly. No pericardial effusion though small amount of fluid is seen tracking to the pericardial recess. Normal caliber of the thoracic aorta though evaluation is markedly degraded secondary to a combination of pulsation artifact as well as streak artifact from contrast within the adjacent pulmonary artery. Bovine configuration of the aortic arch is suspected. Incidental note is made of an azygos nipple. Review of the MIP images confirms the above findings. ---------------------------------------------------------------------------------- Nonvascular Findings: Mediastinum/Lymph Nodes: Mediastinal and right hilar lymphadenopathy with index prevascular lymph node measuring 1.2 cm in greatest short axis diameter (image 28, series 501), index right suprahilar lymph node  measuring 1.5 cm (image 40) and index right infrahilar lymph node measuring approximately 1.8 cm (image 51). No axillary lymphadenopathy. Lungs/Pleura: Evaluation of the pulmonary parenchyma is degraded secondary to patient respiratory artifact. Trace bilateral effusions. Minimal dependent subpleural ground-glass atelectasis. No discrete focal airspace opacities. There is minimal thickening involving the bilateral upper lobe segmental and subsegmental bronchi though the main central pulmonary airways remain widely patent. No discrete pulmonary nodules. Upper abdomen: Limited early arterial phase evaluation of the upper abdomen demonstrates reflux of contrast into the intrahepatic venous system. There is a trace amount perihepatic fluid (image 111, series 501) and fluid seen within the left upper abdominal quadrant (image 87). Musculoskeletal: No acute or aggressive osseous abnormalities. Mild diffuse body wall anasarca. Normal appearance of the thyroid gland. IMPRESSION: 1. No evidence of pulmonary embolism. 2. Marked cardiomegaly with findings worrisome for pulmonary edema including trace bilateral effusions, mild diffuse body wall anasarca and trace amount of fluid seen within the imaged upper abdomen. Additionally, there is reflux of contrast into the intrahepatic venous system which could be indicative of right-sided heart failure. Further evaluation cardiac echo could be performed as clinically indicated. 3. Mediastinal and hilar adenopathy, nonspecific though potentially reactive in etiology. Electronically Signed   By: Simonne Come M.D.   On: 12/17/2015 22:15     Medications:     Current Medications: . atorvastatin  20 mg Oral q1800  . carvedilol  3.125 mg Oral BID WC  . famotidine  20 mg Oral Daily  . folic acid  1 mg Oral Daily  . furosemide  40 mg Intravenous Daily  . multivitamin with minerals  1 tablet Oral Daily  . nicotine  21 mg Transdermal Daily  . rivaroxaban  20 mg Oral Q supper  .  sodium chloride flush  3 mL Intravenous Q12H  . thiamine  100 mg Oral Daily     Infusions:  Assessment:   1. Nausea 2. A/C Systolic Heart Failure- EF 20-25% RV moderately dilated  3. H/O Cardioembolic Stroke 10/2015  4. ADHD 5. Former Smoker   Plan/Discussion:    Mr Steffler is a 41 year with admitted with N/V/D and A/C systolic heart failure. Today ECHO has dropped from June of this year to 10-15%.   Etiology of cardiomyopathy uncertain. Has not had ischemic evaluation. Troponin <0.03. Had virus in June so could be viral. Will check TSH and HIV. Once fully diuresed will need cardiac MRI.   Concern for low output heart failure. Place PICC for CVP and CO-OX with possible addition inotropes. Appears volume overloaded. Diuresed with 80 mg IV lasix twice daily. Hold bb for now. No ace/spiro with low SBP. Add dig 0.125 mg daily. Watch renal function closely.   Says he noticed blood in this stool earlier today. Check FOBT. Hgb down a little. On Xarelto as he had cardioembolic stroke in June 2017. Continue statin.    Length of Stay: 0  Amy Clegg NP-C  12/18/2015, 10:03 AM  Advanced Heart Failure Team Pager [phone] (M-F; 7a - 4p)  Please contact [facility] for night-coverage after hours (4p -7a ) and weekends on [email or web address]  Patient seen and examined with Tonye Becket, NP. We discussed all aspects of the encounter. I agree with the assessment and plan as stated above.   I reviewed echo personally. EF 10% with moderate RV failure. He is critically ill with low output HF/cadiogenic shock. 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 Will move to ICU and place central access. Stop b-blocker. Start dig and spiro. Will need inotropic support and possibly mechanical support. Discussed potential need for short-term inotropic support and possible advanced therapies. Stop Xarelto. Switch to heparin in case he needs emergent mechanical support. Discussed with family as well.   Transfer to our service.   The  patient is critically ill with multiple organ systems failure and requires high complexity decision making for assessment and support, frequent evaluation and titration of therapies, application of advanced monitoring technologies and extensive interpretation of multiple databases.   Critical Care Time devoted to patient care services described in this note is 35 Minutes.  Honest Vanleer,MD 3:28 PM

## 2015-12-18 NOTE — Progress Notes (Signed)
Received patient from 3w and placed on monitor and oxygen at 2l n/c oriented to unit and safety plan .

## 2015-12-18 NOTE — H&P (Signed)
History and Physical    Austin Tran:[id number] DOB: 1986-02-04 DOA: 12/17/2015  Referring MD/NP/PA:   PCP: Austin Garbe, MD   Patient coming from:  The patient is coming from home.  At baseline, pt is independent for most of ADL.     Chief Complaint: Nausea, vomiting, diarrhea, abdominal pain, dizziness, near syncope and shortness of breath  HPI: Austin Tran is a 30 y.o. male with medical history significant of systolic congestive heart failure with EF 20-25 percent, tobacco abuse, recently diagnosed stroke, GERD, ADHD, who presents with nausea, vomiting, diarrhea, abdominal pain, dizziness, near syncope and shortness of breath.  Patient reports that he has been having nausea, vomiting, diarrhea, abdominal pain for about 7 days. He has 5-6 times of watery diarrhea each day, vomited while twice each day without blood in the vomitus. He also has abdominal pain, which is located in epigastric area and RUQ. It is constant, 2 out of 10 in series 2 currently, nonradiating. Patient states that he has worsening shortness of breath, which is worse when lying down. Has dry cough, no chest pain, fever or chills. Denies symptoms of UTI or new unilateral weakness. No vision change or hearing loss. Pt states that he has not taken his lasix for about 7 days.   ED Tran: pt was found to have WBC 15.3, lactate of 2.40-->1.74, negative urinalysis, BNP 2890, lipase 30, temperature normal, tachycardia, tachypnea, AKi with cre 1.42, negative chest x-ray for infiltration, negative CT head for acute intracranial abnormalities. CTA of chest is negative for PE, but showed pulmonary edema. Pt is admitted to tele bed as inpt.   Review of Systems:   General: no fevers, chills, no changes in body weight, has poor appetite, has fatigue HEENT: no blurry vision, hearing changes or sore throat Pulm: has dyspnea, coughing, no wheezing CV: no chest pain, no palpitations Abd: has nausea, vomiting, abdominal  pain, diarrhea, no constipation GU: no dysuria, burning on urination, increased urinary frequency, hematuria  Ext: no leg edema Neuro: no unilateral weakness, numbness, or tingling, no vision change or hearing loss Skin: no rash MSK: No muscle spasm, no deformity, no limitation of range of movement in spin Heme: No easy bruising.  Travel history: No recent long distant travel.  Allergy: No Known Allergies  Past Medical History:  Diagnosis Date  . ADHD (attention deficit hyperactivity disorder)    on adderall since 2004  . Chronic systolic (congestive) heart failure (HCC)   . GERD (gastroesophageal reflux disease)   . HLD (hyperlipidemia)   . Hypertension   . Stroke (HCC)   . Tobacco abuse     Past Surgical History:  Procedure Laterality Date  . EYE SURGERY    . KNEE SURGERY    . TEE WITHOUT CARDIOVERSION N/A 11/22/2015   Procedure: TRANSESOPHAGEAL ECHOCARDIOGRAM (TEE);  Surgeon: Austin Bunting, MD;  Location[facility] ENDOSCOPY;  Service: Cardiovascular;  Laterality: N/A;    Social History:  reports that he has been smoking Cigarettes.  He has never used smokeless tobacco. He reports that he drinks about 7.2 oz of alcohol per week . He reports that he does not use drugs.  Family History:  Family History  Problem Relation Age of Onset  . Hypertension Mother   . Hypertension Father   . Cancer Maternal Grandmother      Prior to Admission medications   Medication Sig Start Date End Date Taking? Authorizing Provider  carvedilol (COREG) 6.25 MG tablet Take 1 tablet (6.25 mg  total) by mouth 2 (two) times daily with a meal. Patient taking differently: Take 3.125 mg by mouth 2 (two) times daily with a meal.  11/23/15  Yes Elease Etienne, MD  doxylamine, Sleep, (UNISOM) 25 MG tablet Take 25 mg by mouth at bedtime as needed for sleep.   Yes Historical Provider, MD  folic acid (FOLVITE) 1 MG tablet Take 1 tablet (1 mg total) by mouth daily. 11/23/15  Yes Elease Etienne, MD  Multiple  Vitamin (MULTIVITAMIN WITH MINERALS) TABS tablet Take 1 tablet by mouth daily. 11/23/15  Yes Elease Etienne, MD  rivaroxaban (XARELTO) 20 MG TABS tablet Take 1 tablet (20 mg total) by mouth daily with supper. 11/25/15  Yes Wendall Stade, MD  thiamine 100 MG tablet Take 1 tablet (100 mg total) by mouth daily. 11/23/15  Yes Elease Etienne, MD  atorvastatin (LIPITOR) 20 MG tablet Take 1 tablet (20 mg total) by mouth daily at 6 PM. Patient not taking: Reported on 12/17/2015 11/23/15   Elease Etienne, MD  furosemide (LASIX) 20 MG tablet Take 1 tablet (20 mg total) by mouth daily. 11/25/15   Wendall Stade, MD  lisinopril (PRINIVIL,ZESTRIL) 5 MG tablet Take 1 tablet (5 mg total) by mouth daily. Patient not taking: Reported on 12/17/2015 11/23/15   Elease Etienne, MD  pantoprazole (PROTONIX) 40 MG tablet Take 1 tablet (40 mg total) by mouth daily. 11/29/15   Austin Course, PA    Physical Exam: Vitals:   12/18/15 0045 12/18/15 0100 12/18/15 0115 12/18/15 0220  BP: 105/76 110/84 104/85 114/80  Pulse: 98 96 99 100  Resp: 23 (!) 52 24 (!) 22  Temp:    97.7 F (36.5 C)  TempSrc:    Axillary  SpO2: 98% 90% 94% 100%  Weight:    93.3 kg (205 lb 9.6 oz)  Height:    5\' 11"  (1.803 m)   General: Not in acute distress HEENT:       Eyes: PERRL, EOMI, no scleral icterus.       ENT: No discharge from the ears and nose, no pharynx injection, no tonsillar enlargement.        Neck: positive JVD, no bruit, no mass felt. Heme: No neck lymph node enlargement. Cardiac: S1/S2, RRR, No murmurs, No gallops or rubs. Pulm: No rales, wheezing, rhonchi or rubs. Abd: Soft, nondistended, has tenderness over epigastric area, no rebound pain, no organomegaly, BS present. GU: No hematuria Ext: No pitting leg edema bilaterally. 2+DP/PT pulse bilaterally. Musculoskeletal: No joint deformities, No joint redness or warmth, no limitation of ROM in spin. Skin: No rashes.  Neuro: Alert, oriented X3, cranial nerves II-XII grossly intact,  moves all extremities normally.  Psych: Patient is not psychotic, no suicidal or hemocidal ideation.  Labs on Admission: I have personally reviewed following labs and imaging studies  CBC:  Recent Labs Lab 12/17/15 1802  WBC 15.3*  HGB 15.9  HCT 48.6  MCV 89.8  PLT 305   Basic Metabolic Panel:  Recent Labs Lab 12/17/15 1802  NA 137  K 4.3  CL 109  CO2 17*  GLUCOSE 131*  BUN 22*  CREATININE 1.42*  CALCIUM 9.1   GFR: Estimated Creatinine Clearance: 88 mL/min (by C-G formula based on SCr of 1.42 mg/dL). Liver Function Tests:  Recent Labs Lab 12/18/15 0226  AST 70*  ALT 128*  ALKPHOS 59  BILITOT 1.0  PROT 5.4*  ALBUMIN 3.0*    Recent Labs Lab 12/18/15 0151  LIPASE  30   No results for input(s): AMMONIA in the last 168 hours. Coagulation Profile:  Recent Labs Lab 12/18/15 0226  INR 3.19   Cardiac Enzymes: No results for input(s): CKTOTAL, CKMB, CKMBINDEX, TROPONINI in the last 168 hours. BNP (last 3 results) No results for input(s): PROBNP in the last 8760 hours. HbA1C: No results for input(s): HGBA1C in the last 72 hours. CBG: No results for input(s): GLUCAP in the last 168 hours. Lipid Profile: No results for input(s): CHOL, HDL, LDLCALC, TRIG, CHOLHDL, LDLDIRECT in the last 72 hours. Thyroid Function Tests: No results for input(s): TSH, T4TOTAL, FREET4, T3FREE, THYROIDAB in the last 72 hours. Anemia Panel: No results for input(s): VITAMINB12, FOLATE, FERRITIN, TIBC, IRON, RETICCTPCT in the last 72 hours. Urine analysis:    Component Value Date/Time   COLORURINE YELLOW 12/18/2015 0028   APPEARANCEUR CLEAR 12/18/2015 0028   LABSPEC 1.037 (H) 12/18/2015 0028   PHURINE 5.0 12/18/2015 0028   GLUCOSEU NEGATIVE 12/18/2015 0028   HGBUR NEGATIVE 12/18/2015 0028   BILIRUBINUR NEGATIVE 12/18/2015 0028   KETONESUR NEGATIVE 12/18/2015 0028   PROTEINUR NEGATIVE 12/18/2015 0028   NITRITE NEGATIVE 12/18/2015 0028   LEUKOCYTESUR NEGATIVE 12/18/2015  0028   Sepsis Labs: @LABRCNTIP (procalcitonin:4,lacticidven:4) ) Recent Results (from the past 240 hour(s))  C difficile quick scan w PCR reflex     Status: None   Collection Time: 12/18/15 12:28 AM  Result Value Ref Range Status   C Diff antigen NEGATIVE NEGATIVE Final   C Diff toxin NEGATIVE NEGATIVE Final   C Diff interpretation No C. difficile detected.  Final     Radiological Exams on Admission: Dg Chest 2 View  Result Date: 12/17/2015 CLINICAL DATA:  Nauseated and anxiety with shortness of breath. EXAM: CHEST  2 VIEW COMPARISON:  None. FINDINGS: The heart size and mediastinal contours are within normal limits. Both lungs are clear. The visualized skeletal structures are unremarkable. IMPRESSION: No active cardiopulmonary disease. Electronically Signed   By: Kennith Center M.D.   On: 12/17/2015 19:26  Ct Head Wo Contrast  Result Date: 12/17/2015 CLINICAL DATA:  Dizziness for the past 5-6 days. History of recent stroke. EXAM: CT HEAD WITHOUT CONTRAST TECHNIQUE: Contiguous axial images were obtained from the base of the skull through the vertex without intravenous contrast. COMPARISON:  11/20/2015; brain MRI - 11/21/2015 FINDINGS: Brain: Minimal encephalomalacia at the site of previous identified left temporal lobe infarct (image 16, series 2). Gray-white differentiation is otherwise well maintained without CT evidence of superimposed acute large territory infarct. No intraparenchymal or extra-axial mass or hemorrhage. Unchanged size and configuration of the ventricles and basilar cisterns. Unchanged appearance of previously characterized arachnoid cysts within the posterior fossa. No midline shift. Vascular: No hyperdense vessel or unexpected calcification. Skull: No displaced calvarial fracture. Sinuses/Orbits: Limited visualization of the paranasal sinuses and mastoid air cells is normal. No air-fluid levels. Other: Regional soft tissues appear normal. IMPRESSION: 1. No acute intracranial  process. 2. Evolving encephalomalacia at site of previous identified left temporal lobe infarct. 3. Unchanged appearance of previously characterized benign arachnoid cysts within the posterior fossa. Electronically Signed   By: Simonne Come M.D.   On: 12/17/2015 20:10  Ct Angio Chest Pe W/cm &/or Wo Cm  Result Date: 12/17/2015 CLINICAL DATA:  Chest pain with exertion. Evaluate for pulmonary embolism. EXAM: CT ANGIOGRAPHY CHEST WITH CONTRAST TECHNIQUE: Multidetector CT imaging of the chest was performed using the standard protocol during bolus administration of intravenous contrast. Multiplanar CT image reconstructions and MIPs were obtained to evaluate  the vascular anatomy. CONTRAST:  100 cc Isovue 370 COMPARISON:  Chest radiograph - earlier same day FINDINGS: Vascular Findings: There is adequate opacification of the pulmonary arterial system with the main pulmonary artery measuring 476 Hounsfield units. There are no discrete filling defects within the pulmonary arterial tree to the level of the bilateral subsegmental pulmonary arteries. Evaluation of the distal subsegmental pulmonary arteries is degraded secondary to patient respiratory artifact. Normal caliber of the main pulmonary artery. Marked cardiomegaly. No pericardial effusion though small amount of fluid is seen tracking to the pericardial recess. Normal caliber of the thoracic aorta though evaluation is markedly degraded secondary to a combination of pulsation artifact as well as streak artifact from contrast within the adjacent pulmonary artery. Bovine configuration of the aortic arch is suspected. Incidental note is made of an azygos nipple. Review of the MIP images confirms the above findings. ---------------------------------------------------------------------------------- Nonvascular Findings: Mediastinum/Lymph Nodes: Mediastinal and right hilar lymphadenopathy with index prevascular lymph node measuring 1.2 cm in greatest short axis diameter  (image 28, series 501), index right suprahilar lymph node measuring 1.5 cm (image 40) and index right infrahilar lymph node measuring approximately 1.8 cm (image 51). No axillary lymphadenopathy. Lungs/Pleura: Evaluation of the pulmonary parenchyma is degraded secondary to patient respiratory artifact. Trace bilateral effusions. Minimal dependent subpleural ground-glass atelectasis. No discrete focal airspace opacities. There is minimal thickening involving the bilateral upper lobe segmental and subsegmental bronchi though the main central pulmonary airways remain widely patent. No discrete pulmonary nodules. Upper abdomen: Limited early arterial phase evaluation of the upper abdomen demonstrates reflux of contrast into the intrahepatic venous system. There is a trace amount perihepatic fluid (image 111, series 501) and fluid seen within the left upper abdominal quadrant (image 87). Musculoskeletal: No acute or aggressive osseous abnormalities. Mild diffuse body wall anasarca. Normal appearance of the thyroid gland. IMPRESSION: 1. No evidence of pulmonary embolism. 2. Marked cardiomegaly with findings worrisome for pulmonary edema including trace bilateral effusions, mild diffuse body wall anasarca and trace amount of fluid seen within the imaged upper abdomen. Additionally, there is reflux of contrast into the intrahepatic venous system which could be indicative of right-sided heart failure. Further evaluation cardiac echo could be performed as clinically indicated. 3. Mediastinal and hilar adenopathy, nonspecific though potentially reactive in etiology. Electronically Signed   By: Simonne Come M.D.   On: 12/17/2015 22:15    EKG: Independently reviewed. Sinus rhythm, QTC 462, LAE, T-wave inversion in V5-V6  Assessment/Plan Principal Problem:   Acute on chronic systolic (congestive) heart failure (HCC) Active Problems:   Attention deficit hyperactivity disorder (ADHD)   CVA (cerebral infarction)    Congestive dilated cardiomyopathy (HCC)   HLD (hyperlipidemia)   GERD (gastroesophageal reflux disease)   Elevated lactic acid level   Near syncope   Abdominal pain   Nausea, vomiting and diarrhea   Sepsis (HCC)   Acute on chronic systolic (congestive) heart failure (HCC): pt's shortness of breath is most likely caused by CHF exacerbation. Patient does not have leg edema, but has JVD and elevated BNP 2890, consistent with CHF exacerbation. Due to elevated lactate and possible sepsis, pt was given 500 cc of NS in ED, but later 40 mg of IV lasix was given by EDP.  -will admit to tele bed as inpt -Lasix 40 mg daily by IV -trop x 3 -2d echo -will continue home coreg -Hold lisinopril due to AKI (pt is not taking lisinopril at home currently) -Daily weights -strict I/O's -Low salt diet -May call  Card in AM (Dr. Eden Emms is his cardiologist)  Nausea, vomiting, diarrhea and abdominal pain: Etiology is not clear. Likely due to viral gastroenteritis. Lipase is normal. Need to rule out other possibilities, such as a C. difficile colitis. -check c diff pcr and GI path panel  Possible sepsis: Has elevated lactate at 2.40, leukocytosis, tachycardia, meeting criteria for sepsis. Lactate level has normalized with 500 cc NS. -f/u Bx and c diff pcr.   Near syncope and dizziness: CT head is negative for acute intracranial abnormalities. CT angiogram of chest is negative for PE. This is most likely due to orthostatic vital signs secondary to diarrhea and low EF. -check orthostatic vital signs -pt/ot  Stroke:  -on Xarelto due to low EF (20-25%)  HLD: Last LDL was 128 on 11/22/15 -Continue home medications: lipitor -Check FLP  GERD: -will switch PPI to pepcid IV until C diff pcr negative  AKI: Likely due to cardiorenal syndrome.  - treat CHF exacerbation as above - Check FeUrea - Follow up renal function by BMP - Avoid ACEI and NSAIDs  Tobacco abuse: -Did counseling about importance of  quitting smoking -Nicotine patch  DVT ppx: on Xarelto Code Status: Full code Family Communication: Yes, patient's parents and girl friend at bed side Disposition Plan:  Anticipate discharge back to previous home environment Consults called:  none Admission status:  Inpatient/tele  Date of Service 12/18/2015    Lorretta Harp [facility] Pager [phone]  If 7PM-7AM, please contact night-coverage [email or web address] Password [email or web address] 12/18/2015, 3:24 AM

## 2015-12-19 ENCOUNTER — Inpatient Hospital Stay (HOSPITAL_COMMUNITY): Payer: BLUE CROSS/BLUE SHIELD

## 2015-12-19 DIAGNOSIS — I429 Cardiomyopathy, unspecified: Secondary | ICD-10-CM

## 2015-12-19 LAB — CBC
HEMATOCRIT: 40.3 % (ref 39.0–52.0)
HEMOGLOBIN: 13.6 g/dL (ref 13.0–17.0)
MCH: 29.6 pg (ref 26.0–34.0)
MCHC: 33.7 g/dL (ref 30.0–36.0)
MCV: 87.8 fL (ref 78.0–100.0)
Platelets: 211 10*3/uL (ref 150–400)
RBC: 4.59 MIL/uL (ref 4.22–5.81)
RDW: 13.1 % (ref 11.5–15.5)
WBC: 9.4 10*3/uL (ref 4.0–10.5)

## 2015-12-19 LAB — BASIC METABOLIC PANEL
ANION GAP: 9 (ref 5–15)
Anion gap: 9 (ref 5–15)
BUN: 20 mg/dL (ref 6–20)
BUN: 20 mg/dL (ref 6–20)
CALCIUM: 8.3 mg/dL — AB (ref 8.9–10.3)
CO2: 24 mmol/L (ref 22–32)
CO2: 26 mmol/L (ref 22–32)
Calcium: 8.5 mg/dL — ABNORMAL LOW (ref 8.9–10.3)
Chloride: 101 mmol/L (ref 101–111)
Chloride: 102 mmol/L (ref 101–111)
Creatinine, Ser: 1.25 mg/dL — ABNORMAL HIGH (ref 0.61–1.24)
Creatinine, Ser: 1.26 mg/dL — ABNORMAL HIGH (ref 0.61–1.24)
GFR calc Af Amer: >60 mL/min (ref >60)
GFR calc Af Amer: >60 mL/min (ref >60)
GFR calc non Af Amer: >60 mL/min (ref >60)
GLUCOSE: 164 mg/dL — AB (ref 65–99)
Glucose, Bld: 145 mg/dL — ABNORMAL HIGH (ref 65–99)
Potassium: 3.4 mmol/L — ABNORMAL LOW (ref 3.5–5.1)
Potassium: 3.5 mmol/L (ref 3.5–5.1)
Sodium: 135 mmol/L (ref 135–145)
Sodium: 136 mmol/L (ref 135–145)

## 2015-12-19 LAB — CARBOXYHEMOGLOBIN
CARBOXYHEMOGLOBIN: 1.7 % — AB (ref 0.5–1.5)
METHEMOGLOBIN: 0.6 % (ref 0.0–1.5)
O2 Saturation: 74 %
Total hemoglobin: 14 g/dL (ref 13.5–18.0)

## 2015-12-19 LAB — CREATININE, URINE, RANDOM: CREATININE, URINE: 72.11 mg/dL

## 2015-12-19 LAB — TROPONIN I: TROPONIN I: 0.03 ng/mL — AB (ref <0.03)

## 2015-12-19 LAB — APTT
aPTT: 46 seconds — ABNORMAL HIGH (ref 24–36)
aPTT: 78 seconds — ABNORMAL HIGH (ref 24–36)

## 2015-12-19 LAB — HIV ANTIBODY (ROUTINE TESTING W REFLEX): HIV SCREEN 4TH GENERATION: NONREACTIVE

## 2015-12-19 LAB — OCCULT BLOOD X 1 CARD TO LAB, STOOL: Fecal Occult Bld: POSITIVE — AB

## 2015-12-19 MED ORDER — RIVAROXABAN 20 MG PO TABS
20.0000 mg | ORAL_TABLET | Freq: Every day | ORAL | Status: DC
  Administered 2015-12-20 – 2015-12-22 (×3): 20 mg via ORAL
  Filled 2015-12-19 (×3): qty 1

## 2015-12-19 MED ORDER — RIVAROXABAN 20 MG PO TABS
20.0000 mg | ORAL_TABLET | Freq: Once | ORAL | Status: AC
  Administered 2015-12-19: 20 mg via ORAL
  Filled 2015-12-19: qty 1

## 2015-12-19 MED ORDER — GADOBENATE DIMEGLUMINE 529 MG/ML IV SOLN
30.0000 mL | Freq: Once | INTRAVENOUS | Status: AC | PRN
  Administered 2015-12-19: 30 mL via INTRAVENOUS

## 2015-12-19 MED ORDER — LISINOPRIL 2.5 MG PO TABS
2.5000 mg | ORAL_TABLET | Freq: Every day | ORAL | Status: DC
  Administered 2015-12-19: 2.5 mg via ORAL
  Filled 2015-12-19: qty 1

## 2015-12-19 MED ORDER — POTASSIUM CHLORIDE CRYS ER 20 MEQ PO TBCR
40.0000 meq | EXTENDED_RELEASE_TABLET | Freq: Once | ORAL | Status: AC
  Administered 2015-12-19: 40 meq via ORAL
  Filled 2015-12-19: qty 2

## 2015-12-19 MED ORDER — SPIRONOLACTONE 25 MG PO TABS
12.5000 mg | ORAL_TABLET | Freq: Every day | ORAL | Status: DC
  Administered 2015-12-19: 12.5 mg via ORAL
  Filled 2015-12-19: qty 1

## 2015-12-19 MED ORDER — POTASSIUM CHLORIDE CRYS ER 20 MEQ PO TBCR
40.0000 meq | EXTENDED_RELEASE_TABLET | Freq: Every day | ORAL | Status: DC
  Administered 2015-12-19: 40 meq via ORAL
  Filled 2015-12-19: qty 2

## 2015-12-19 NOTE — Evaluation (Signed)
Physical Therapy Evaluation and Discharge Patient Details Name: Austin Tran MRN: [id number] DOB: [birth year] Today's Date: 12/19/2015   History of Present Illness  30 y.o. male with medical history significant of systolic congestive heart failure with EF 10% percent, tobacco abuse, 10/2015 Lt temporal stroke, ADHD, who presents with nausea, vomiting, diarrhea, abdominal pain, dizziness, near syncope and shortness of breath. +cardiogenic shock    Clinical Impression  Patient evaluated by Physical Therapy with no further acute PT needs identified. HR, BP and SaO2 all WNL with normal response to incr activity. Patient is safe to ambulate with nursing. PT is signing off. Thank you for this referral.     Follow Up Recommendations No PT follow up    Equipment Recommendations  None recommended by PT    Recommendations for Other Services       Precautions / Restrictions Precautions Precautions: None Precaution Comments: 2 IV poles, multiple lines      Mobility  Bed Mobility Overal bed mobility: Independent                Transfers Overall transfer level: Independent Equipment used: None                Ambulation/Gait Ambulation/Gait assistance: Supervision;Independent Ambulation Distance (Feet): 380 Feet Assistive device:  (pushing 1 IV pole) Gait Pattern/deviations: WFL(Within Functional Limits)   Gait velocity interpretation: Below normal speed for age/gender    Stairs            Wheelchair Mobility    Modified Rankin (Stroke Patients Only)       Balance Overall balance assessment: Independent                                           Pertinent Vitals/Pain HR 102-109 SaO2 >96% on RA BP pre- 124/87   Post- 126/87  Pain Assessment: No/denies pain    Home Living Family/patient expects to be discharged to:: Private residence Living Arrangements: Spouse/significant other (girlfriend) Available Help at Discharge:  Family;Friend(s) Type of Home: House Home Access: Stairs to enter Entrance Stairs-Rails: Right (uses wall on other side) Entrance Stairs-Number of Steps: 12 Home Layout: One level (upstairs of a duplex) Home Equipment: None      Prior Function Level of Independence: Independent         Comments: denies deficits from recent CVA     Hand Dominance   Dominant Hand: Right    Extremity/Trunk Assessment   Upper Extremity Assessment: Overall WFL for tasks assessed           Lower Extremity Assessment: Overall WFL for tasks assessed      Cervical / Trunk Assessment: Normal  Communication   Communication: No difficulties  Cognition Arousal/Alertness: Awake/alert Behavior During Therapy: WFL for tasks assessed/performed Overall Cognitive Status: No family/caregiver present to determine baseline cognitive functioning (decr memory of plan told to him at beginning of session Banker))       Memory: Decreased short-term memory              General Comments      Exercises        Assessment/Plan    PT Assessment Patent does not need any further PT services  PT Diagnosis     PT Problem List    PT Treatment Interventions     PT Goals (Current goals can be found in the Care Plan section) Acute  Rehab PT Goals PT Goal Formulation: All assessment and education complete, DC therapy    Frequency     Barriers to discharge        Co-evaluation               End of Session Equipment Utilized During Treatment: Gait belt Activity Tolerance: Patient tolerated treatment well Patient left: in chair;with call bell/phone within reach Nurse Communication: Mobility status;Other (comment) (memory deficits vs inattention)         Time: 8295-6213 PT Time Calculation (min) (ACUTE ONLY): 32 min   Charges:   PT Evaluation $PT Eval Low Complexity: 1 Procedure     PT G Codes:        Venola Castello 01/01/2016, 10:20 AM  Pager [phone]

## 2015-12-19 NOTE — Progress Notes (Signed)
OT Cancellation Note  Patient Details Name: Austin Tran MRN: [id number] DOB: 1986-03-01   Cancelled Treatment:    Reason Eval/Treat Not Completed: OT screened, no needs identified, will sign off. Pt reports no concerns with ADLs and his girlfriend and parents will be assisting with IADLs until he fully recovers. Pt ambulated independently with PT earlier today with no balance deficits noted. Pt being followed by Cardiac Rehab and will continue ambulating with nursing.  Nils Pyle, OTR/L Pager: [phone] 12/19/2015, 2:30 PM

## 2015-12-19 NOTE — Progress Notes (Signed)
Advanced Heart Failure Rounding Note   Subjective:    Yesterday CO-OX was 31%. Started on milrinone 0.375 mcg with todays CO-OX up to 74%. BB stopped. Also diuresed with IV lasix. Brisk diuresis noted.   Says he was able to rest for the time in weeks. Denies SOB/Orthopnea. .   Blood Cultures- NGTD Stool Negative C Diff  TSH 3.5  HIV NR      Objective:   Weight Range:  Vital Signs:   Temp:  [98.2 F (36.8 C)-98.3 F (36.8 C)] 98.2 F (36.8 C) (07/27 0300) Pulse Rate:  [75-100] 87 (07/27 0605) Resp:  [16-35] 23 (07/27 0605) BP: (91-122)/(43-92) 108/76 (07/27 0605) SpO2:  [91 %-100 %] 93 % (07/27 0605) Weight:  [91.5 kg (201 lb 11.5 oz)] 91.5 kg (201 lb 11.5 oz) (07/27 0500) Last BM Date: 12/18/15  Weight change: Filed Weights   12/18/15 0220 12/19/15 0500  Weight: 93.3 kg (205 lb 9.6 oz) 91.5 kg (201 lb 11.5 oz)    Intake/Output:   Intake/Output Summary (Last 24 hours) at 12/19/15 0709 Last data filed at 12/19/15 0600  Gross per 24 hour  Intake           969.18 ml  Output             4150 ml  Net         -3180.82 ml     Physical Exam: CVP 7-8 General:  Well appearing. No resp difficulty. In bed  HEENT: normal Neck: supple. JVP ~8. Carotids 2+ bilat; no bruits. No lymphadenopathy or thryomegaly appreciated. Cor: PMI nondisplaced. Regular rate & rhythm. No rubs, or murmurs.+ S3 Lungs: clear Abdomen: soft, nontender, nondistended. No hepatosplenomegaly. No bruits or masses. Good bowel sounds. Extremities: no cyanosis, clubbing, rash, edema. RUE PICC Neuro: alert & orientedx3, cranial nerves grossly intact. moves all 4 extremities w/o difficulty. Affect pleasant  Telemetry: NSR 80s   Labs: Basic Metabolic Panel:  Recent Labs Lab 12/17/15 1802 12/18/15 0755 12/18/15 2348 12/19/15 0524  NA 137 135 135 136  K 4.3 4.0 3.5 3.4*  CL 109 105 102 101  CO2 17* 20* 24 26  GLUCOSE 131* 92 145* 164*  BUN 22* 21* 20 20  CREATININE 1.42* 1.38* 1.25*  1.26*  CALCIUM 9.1 8.7* 8.5* 8.3*  MG  --  1.9  --   --     Liver Function Tests:  Recent Labs Lab 12/18/15 0226  AST 70*  ALT 128*  ALKPHOS 59  BILITOT 1.0  PROT 5.4*  ALBUMIN 3.0*    Recent Labs Lab 12/18/15 0151  LIPASE 30   No results for input(s): AMMONIA in the last 168 hours.  CBC:  Recent Labs Lab 12/17/15 1802 12/18/15 0755 12/19/15 0524  WBC 15.3* 10.7* 9.4  NEUTROABS  --  5.9  --   HGB 15.9 14.3 13.6  HCT 48.6 43.8 40.3  MCV 89.8 91.1 87.8  PLT 305 229 211    Cardiac Enzymes:  Recent Labs Lab 12/18/15 0755 12/18/15 1323 12/18/15 2348  TROPONINI 0.03* 0.03* 0.03*    BNP: BNP (last 3 results)  Recent Labs  12/17/15 1938  BNP 2,890.3*    ProBNP (last 3 results) No results for input(s): PROBNP in the last 8760 hours.    Other results:  Imaging: Dg Chest 2 View  Result Date: 12/17/2015 CLINICAL DATA:  Nauseated and anxiety with shortness of breath. EXAM: CHEST  2 VIEW COMPARISON:  None. FINDINGS: The heart size and mediastinal contours are  within normal limits. Both lungs are clear. The visualized skeletal structures are unremarkable. IMPRESSION: No active cardiopulmonary disease. Electronically Signed   By: Kennith Center M.D.   On: 12/17/2015 19:26  Ct Head Wo Contrast  Result Date: 12/17/2015 CLINICAL DATA:  Dizziness for the past 5-6 days. History of recent stroke. EXAM: CT HEAD WITHOUT CONTRAST TECHNIQUE: Contiguous axial images were obtained from the base of the skull through the vertex without intravenous contrast. COMPARISON:  11/20/2015; brain MRI - 11/21/2015 FINDINGS: Brain: Minimal encephalomalacia at the site of previous identified left temporal lobe infarct (image 16, series 2). Gray-white differentiation is otherwise well maintained without CT evidence of superimposed acute large territory infarct. No intraparenchymal or extra-axial mass or hemorrhage. Unchanged size and configuration of the ventricles and basilar cisterns.  Unchanged appearance of previously characterized arachnoid cysts within the posterior fossa. No midline shift. Vascular: No hyperdense vessel or unexpected calcification. Skull: No displaced calvarial fracture. Sinuses/Orbits: Limited visualization of the paranasal sinuses and mastoid air cells is normal. No air-fluid levels. Other: Regional soft tissues appear normal. IMPRESSION: 1. No acute intracranial process. 2. Evolving encephalomalacia at site of previous identified left temporal lobe infarct. 3. Unchanged appearance of previously characterized benign arachnoid cysts within the posterior fossa. Electronically Signed   By: Simonne Come M.D.   On: 12/17/2015 20:10  Ct Angio Chest Pe W/cm &/or Wo Cm  Result Date: 12/17/2015 CLINICAL DATA:  Chest pain with exertion. Evaluate for pulmonary embolism. EXAM: CT ANGIOGRAPHY CHEST WITH CONTRAST TECHNIQUE: Multidetector CT imaging of the chest was performed using the standard protocol during bolus administration of intravenous contrast. Multiplanar CT image reconstructions and MIPs were obtained to evaluate the vascular anatomy. CONTRAST:  100 cc Isovue 370 COMPARISON:  Chest radiograph - earlier same day FINDINGS: Vascular Findings: There is adequate opacification of the pulmonary arterial system with the main pulmonary artery measuring 476 Hounsfield units. There are no discrete filling defects within the pulmonary arterial tree to the level of the bilateral subsegmental pulmonary arteries. Evaluation of the distal subsegmental pulmonary arteries is degraded secondary to patient respiratory artifact. Normal caliber of the main pulmonary artery. Marked cardiomegaly. No pericardial effusion though small amount of fluid is seen tracking to the pericardial recess. Normal caliber of the thoracic aorta though evaluation is markedly degraded secondary to a combination of pulsation artifact as well as streak artifact from contrast within the adjacent pulmonary artery.  Bovine configuration of the aortic arch is suspected. Incidental note is made of an azygos nipple. Review of the MIP images confirms the above findings. ---------------------------------------------------------------------------------- Nonvascular Findings: Mediastinum/Lymph Nodes: Mediastinal and right hilar lymphadenopathy with index prevascular lymph node measuring 1.2 cm in greatest short axis diameter (image 28, series 501), index right suprahilar lymph node measuring 1.5 cm (image 40) and index right infrahilar lymph node measuring approximately 1.8 cm (image 51). No axillary lymphadenopathy. Lungs/Pleura: Evaluation of the pulmonary parenchyma is degraded secondary to patient respiratory artifact. Trace bilateral effusions. Minimal dependent subpleural ground-glass atelectasis. No discrete focal airspace opacities. There is minimal thickening involving the bilateral upper lobe segmental and subsegmental bronchi though the main central pulmonary airways remain widely patent. No discrete pulmonary nodules. Upper abdomen: Limited early arterial phase evaluation of the upper abdomen demonstrates reflux of contrast into the intrahepatic venous system. There is a trace amount perihepatic fluid (image 111, series 501) and fluid seen within the left upper abdominal quadrant (image 87). Musculoskeletal: No acute or aggressive osseous abnormalities. Mild diffuse body wall anasarca. Normal appearance of  the thyroid gland. IMPRESSION: 1. No evidence of pulmonary embolism. 2. Marked cardiomegaly with findings worrisome for pulmonary edema including trace bilateral effusions, mild diffuse body wall anasarca and trace amount of fluid seen within the imaged upper abdomen. Additionally, there is reflux of contrast into the intrahepatic venous system which could be indicative of right-sided heart failure. Further evaluation cardiac echo could be performed as clinically indicated. 3. Mediastinal and hilar adenopathy,  nonspecific though potentially reactive in etiology. Electronically Signed   By: Simonne Come M.D.   On: 12/17/2015 22:15     Medications:     Scheduled Medications: . atorvastatin  20 mg Oral q1800  . digoxin  0.125 mg Oral Daily  . famotidine  20 mg Oral Daily  . folic acid  1 mg Oral Daily  . furosemide  80 mg Intravenous BID  . multivitamin with minerals  1 tablet Oral Daily  . nicotine  21 mg Transdermal Daily  . sodium chloride flush  10-40 mL Intracatheter Q12H  . sodium chloride flush  3 mL Intravenous Q12H  . thiamine  100 mg Oral Daily     Infusions: . heparin 1,600 Units/hr (12/19/15 0600)  . milrinone 0.375 mcg/kg/min (12/19/15 0600)     PRN Medications:  sodium chloride, ALPRAZolam, loperamide, ondansetron, sodium chloride flush, sodium chloride flush, zolpidem   Assessment:  1. Cardiogenic Shock 2. A/C Systolic Heart Failure- EF 10% RV moderately dilated  3. H/O Cardioembolic Stroke 10/2015  4. ADHD 5. 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 Has not had ischemic work up. EF 10%.  Will need CMRI.   Slowly improving. Todays CO-OX is 74% on 0.375 mcg of milrinone. Cut back milrinone to 025 mcg. Volume status improving. CVP 7-8. Continue IV lasix but may stop later this afternoon. No  BB with cardiogenic shock. Continue dig 0.125 mg daily. Add 12.5 mg spiro daily. Add 40 meq potassium. Add 2.5 mg lisinopril. Renal function stable.   Consult cardiac rehab.   Length of Stay: 1  Amy Clegg NP-C  12/19/2015, 7:09 AM  Advanced Heart Failure Team Pager [phone] (M-F; 7a - 4p)  Please contact [facility] for night-coverage after hours (4p -7a ) and weekends on [email or web address]  Patient seen with NP, agree with the above note.  Doing markedly better on milrinone, co-ox up a lot.  CVP down to 8 range but JVP still looks high to me.  Concern for viral myocarditis.   - Decrease milrinone to 0.25.  - Continue IV Lasix today, maybe to po  tomorrow.  - Cardiac MRI to assess for infiltrative disease.   - Continue digoxin, added lisinopril and spironolactone.   Cardioembolic CVA. Resume Xarelto.   Will try to wean off milrinone and onto po meds over the next couple of days.  Long-term,hope for recovery if viral myocarditis.   Marca Ancona 12/19/2015 10:48 AM

## 2015-12-19 NOTE — Progress Notes (Signed)
ANTICOAGULATION CONSULT NOTE - Follow-up Consult  Pharmacy Consult for Heparin Indication: history of stroke  No Known Allergies  Patient Measurements: Height: 5\' 11"  (180.3 cm) Weight: 201 lb 11.5 oz (91.5 kg) IBW/kg (Calculated) : 75.3 Heparin Dosing Weight: 93 kg  Vital Signs: Temp: 98.1 F (36.7 C) (07/27 1200) Temp Source: Oral (07/27 1200) BP: 124/87 (07/27 0900) Pulse Rate: 95 (07/27 0900)  Labs:  Recent Labs  12/17/15 1802  12/18/15 0226 12/18/15 0755 12/18/15 1323 12/18/15 1745 12/18/15 1746 12/18/15 2348 12/19/15 0200 12/19/15 0524 12/19/15 1115  HGB 15.9  --   --  14.3  --   --   --   --   --  13.6  --   HCT 48.6  --   --  43.8  --   --   --   --   --  40.3  --   PLT 305  --   --  229  --   --   --   --   --  211  --   APTT  --   < > 37*  --   --   --  37*  --  46*  --  78*  LABPROT  --   --  32.1*  --   --   --   --   --   --   --   --   INR  --   --  3.19  --   --   --   --   --   --   --   --   HEPARINUNFRC  --   --   --   --   --  1.96*  --   --   --   --   --   CREATININE 1.42*  --   --  1.38*  --   --   --  1.25*  --  1.26*  --   TROPONINI  --   --   --  0.03* 0.03*  --   --  0.03*  --   --   --   < > = values in this interval not displayed.  Estimated Creatinine Clearance: 98.3 mL/min (by C-G formula based on SCr of 1.26 mg/dL).   Assessment: 30yo male with history of stroke, CHF, GERD and tobacco use presents with N/V/D, syncope and SOB. Pt on Xarelto PTA for h/o cardioembolic stroke. Xarelto last taken 12/16/15. Xarelto on hold in case pt needs emergent mechanical support. Pharmacy is consulted to dose heparin while Xarelto on hold. Utilizing PTT to monitor heparin since Xarelto affecting heparin level (1.96). No further blood in stool per RN.  PTT 78 sec (therapeutic) on 1600 units/hr. Plan to stop heparin and restart Xarelto  Goal of Therapy:  Heparin level 0.3-0.7 units/ml aPTT 66-102 seconds Monitor platelets by anticoagulation protocol:  Yes   Plan:  Turn heparin off xarelto 20mg  x1 now then qd with evening meal starting tomorrow  Leota Sauers Pharm.D. CPP, BCPS Clinical Pharmacist [phone] 12/19/2015 1:24 PM

## 2015-12-19 NOTE — Progress Notes (Addendum)
ANTICOAGULATION CONSULT NOTE - Follow-up Consult  Pharmacy Consult for Heparin Indication: history of stroke  No Known Allergies  Patient Measurements: Height: 5\' 11"  (180.3 cm) Weight: 205 lb 9.6 oz (93.3 kg) IBW/kg (Calculated) : 75.3 Heparin Dosing Weight: 93 kg  Vital Signs: Temp: 98.2 F (36.8 C) (07/27 0300) Temp Source: Oral (07/27 0300) BP: 106/80 (07/27 0300) Pulse Rate: 95 (07/27 0300)  Labs:  Recent Labs  12/17/15 1802 12/18/15 0226 12/18/15 0755 12/18/15 1323 12/18/15 1745 12/18/15 1746 12/18/15 2348 12/19/15 0200  HGB 15.9  --  14.3  --   --   --   --   --   HCT 48.6  --  43.8  --   --   --   --   --   PLT 305  --  229  --   --   --   --   --   APTT  --  37*  --   --   --  37*  --  46*  LABPROT  --  32.1*  --   --   --   --   --   --   INR  --  3.19  --   --   --   --   --   --   HEPARINUNFRC  --   --   --   --  1.96*  --   --   --   CREATININE 1.42*  --  1.38*  --   --   --  1.25*  --   TROPONINI  --   --  0.03* 0.03*  --   --  0.03*  --     Estimated Creatinine Clearance: 99.9 mL/min (by C-G formula based on SCr of 1.25 mg/dL).   Assessment: 30yo male with history of stroke, CHF, GERD and tobacco use presents with N/V/D, syncope and SOB. Pt on Xarelto PTA for h/o cardioembolic stroke. Xarelto last taken 12/16/15. Xarelto on hold in case pt needs emergent mechanical support. Pharmacy is consulted to dose heparin while Xarelto on hold. Utilizing PTT to monitor heparin since Xarelto affecting heparin level (1.96). No further blood in stool per RN.  PTT 46 sec (subtherapeutic) on 1300 units/hr.  Goal of Therapy:  Heparin level 0.3-0.7 units/ml aPTT 66-102 seconds Monitor platelets by anticoagulation protocol: Yes   Plan:  Increase heparin to 1600 units/hr F/u 6 hr PTT  Christoper Fabian, PharmD, BCPS Clinical pharmacist, pager [phone] 12/19/2015,4:13 AM

## 2015-12-19 NOTE — Progress Notes (Signed)
CARDIAC REHAB PHASE I   PRE:  Rate/Rhythm: 98 SR    BP: sitting 96/68    SaO2: 96 RA  MODE:  Ambulation: 740 ft   POST:  Rate/Rhythm: 111 ST    BP: sitting 111/91     SaO2: 98 RA  Tolerated well. Felt good to walk, denied c/o. Began ed with pt including daily wts, low sodium, zones. Gave HF booklet and video to watch. Will f/u but can walk with staff/family. [phone]   Harriet Masson CES, ACSM 12/19/2015 2:40 PM

## 2015-12-19 NOTE — Progress Notes (Signed)
Discontinued enteric precautions per infection prevention.

## 2015-12-20 LAB — BASIC METABOLIC PANEL
ANION GAP: 11 (ref 5–15)
BUN: 19 mg/dL (ref 6–20)
CHLORIDE: 98 mmol/L — AB (ref 101–111)
CO2: 29 mmol/L (ref 22–32)
Calcium: 8.7 mg/dL — ABNORMAL LOW (ref 8.9–10.3)
Creatinine, Ser: 1.25 mg/dL — ABNORMAL HIGH (ref 0.61–1.24)
GFR calc non Af Amer: >60 mL/min (ref >60)
Glucose, Bld: 131 mg/dL — ABNORMAL HIGH (ref 65–99)
POTASSIUM: 3.4 mmol/L — AB (ref 3.5–5.1)
SODIUM: 138 mmol/L (ref 135–145)

## 2015-12-20 LAB — O&P RESULT

## 2015-12-20 LAB — CARBOXYHEMOGLOBIN
CARBOXYHEMOGLOBIN: 1.7 % — AB (ref 0.5–1.5)
METHEMOGLOBIN: 0.5 % (ref 0.0–1.5)
O2 Saturation: 72.8 %
Total hemoglobin: 15 g/dL (ref 13.5–18.0)

## 2015-12-20 LAB — OVA + PARASITE EXAM

## 2015-12-20 LAB — UREA NITROGEN, URINE: UREA NITROGEN UR: 409 mg/dL

## 2015-12-20 MED ORDER — LOSARTAN POTASSIUM 25 MG PO TABS
12.5000 mg | ORAL_TABLET | Freq: Two times a day (BID) | ORAL | Status: DC
  Administered 2015-12-20 – 2015-12-21 (×3): 12.5 mg via ORAL
  Filled 2015-12-20 (×4): qty 1

## 2015-12-20 MED ORDER — POTASSIUM CHLORIDE CRYS ER 20 MEQ PO TBCR
40.0000 meq | EXTENDED_RELEASE_TABLET | Freq: Two times a day (BID) | ORAL | Status: DC
  Administered 2015-12-20 – 2015-12-22 (×5): 40 meq via ORAL
  Filled 2015-12-20 (×5): qty 2

## 2015-12-20 MED ORDER — FUROSEMIDE 40 MG PO TABS
40.0000 mg | ORAL_TABLET | Freq: Two times a day (BID) | ORAL | Status: DC
  Administered 2015-12-20 – 2015-12-23 (×7): 40 mg via ORAL
  Filled 2015-12-20 (×7): qty 1

## 2015-12-20 MED ORDER — SPIRONOLACTONE 25 MG PO TABS
25.0000 mg | ORAL_TABLET | Freq: Every day | ORAL | Status: DC
  Administered 2015-12-20 – 2015-12-23 (×4): 25 mg via ORAL
  Filled 2015-12-20 (×4): qty 1

## 2015-12-20 NOTE — Progress Notes (Signed)
CARDIAC REHAB PHASE I   PRE:  Rate/Rhythm: 103 ST    BP: sitting 112/86    SaO2: 99 RA  MODE:  Ambulation: 1100 ft   POST:  Rate/Rhythm: 110 ST    BP: sitting 110/83     SaO2:   Tolerated well, no c/o. Reinforced ed and gave walking gl and discussed CRPII. Will send referral to [place] [facility]. Pt is slowly accepting his dx and trying to work on change. He sts he does not smoke but not drinking will be hard for the social aspect. Encouraged him to discuss light drinking with his MD.  [phone]   Austin Tran CES, ACSM 12/20/2015 2:56 PM

## 2015-12-20 NOTE — Care Management Note (Signed)
Case Management Note  Patient Details  Name: Austin Tran MRN: [id number] Date of Birth: 1985/10/26  Subjective/Objective:  Pt admitted on 12/17/15 with systolic CHF with 10% EF. PTA, pt independent, lives with girlfriend.   Has supportive parents.                    Action/Plan: PT/OT has signed off; no OP therapy recommended.  Will follow for discharge planning as pt progresses.    Expected Discharge Date:                  Expected Discharge Plan:  Home w Home Health Services  In-House Referral:     Discharge planning Services  CM Consult  Post Acute Care Choice:    Choice offered to:     DME Arranged:    DME Agency:     HH Arranged:    HH Agency:     Status of Service:  In process, will continue to follow  If discussed at Long Length of Stay Meetings, dates discussed:    Additional Comments: Noted pt to start back on Xarelto therapy, and may dc over the weekend.  Will provide 30 day free trial card and copay savings card..he will be able to obtain Xarelto for $0/month for one year with copay card.    Glennon Mac, RN 12/20/2015, 1:48 PM [phone]

## 2015-12-20 NOTE — Progress Notes (Signed)
Advanced Heart Failure Rounding Note   Subjective:    On July 26th CO-OX was 31%. Started on milrinone 0.375 mcg.   Yesterday milrinone was cut back to 0.25 mcg, lisinopril added, and spiro started   Todays CO-OX up to 72%. Also diuresed with IV lasix. Brisk diuresis noted.   Denies SOB/Orthopnea. Appetite improved  CMRI - 1.  Severe LV dilation with EF 16%, diffuse hypokinesis. 2.  Mild RV dilation, moderately decreased RV systolic function. 3. LGE in the basal to mid inferoseptal RV insertion site. This is a nonspecific finding and often suggests volume overload/LV strain  Blood Cultures- NGTD Stool Negative C Diff  TSH 3.5  HIV NR      Objective:   Weight Range:  Vital Signs:   Temp:  [97.8 F (36.6 C)-98.7 F (37.1 C)] 98 F (36.7 C) (07/27 2345) Pulse Rate:  [82-109] 90 (07/28 0600) Resp:  [15-27] 16 (07/28 0600) BP: (96-126)/(61-89) 107/69 (07/28 0600) SpO2:  [92 %-99 %] 93 % (07/28 0600) Weight:  [86.1 kg (189 lb 14.4 oz)] 86.1 kg (189 lb 14.4 oz) (07/28 0600) Last BM Date: 12/18/15  Weight change: Filed Weights   12/18/15 0220 12/19/15 0500 12/20/15 0600  Weight: 93.3 kg (205 lb 9.6 oz) 91.5 kg (201 lb 11.5 oz) 86.1 kg (189 lb 14.4 oz)    Intake/Output:   Intake/Output Summary (Last 24 hours) at 12/20/15 0708 Last data filed at 12/20/15 0600  Gross per 24 hour  Intake          1701.95 ml  Output             6075 ml  Net         -4373.05 ml     Physical Exam: CVP 6  General:  Well appearing. No resp difficulty. In bed  HEENT: normal Neck: supple. JVP 5-6 . Carotids 2+ bilat; no bruits. No lymphadenopathy or thryomegaly appreciated. Cor: PMI nondisplaced. Regular rate & rhythm. No rubs, or murmurs.+ S3 Lungs: clear Abdomen: soft, nontender, nondistended. No hepatosplenomegaly. No bruits or masses. Good bowel sounds. Extremities: no cyanosis, clubbing, rash, edema. RUE PICC Neuro: alert & orientedx3, cranial nerves grossly intact. moves  all 4 extremities w/o difficulty. Affect pleasant  Telemetry: NSR 80s   Labs: Basic Metabolic Panel:  Recent Labs Lab 12/17/15 1802 12/18/15 0755 12/18/15 2348 12/19/15 0524 12/20/15 0335  NA 137 135 135 136 138  K 4.3 4.0 3.5 3.4* 3.4*  CL 109 105 102 101 98*  CO2 17* 20* 24 26 29   GLUCOSE 131* 92 145* 164* 131*  BUN 22* 21* 20 20 19   CREATININE 1.42* 1.38* 1.25* 1.26* 1.25*  CALCIUM 9.1 8.7* 8.5* 8.3* 8.7*  MG  --  1.9  --   --   --     Liver Function Tests:  Recent Labs Lab 12/18/15 0226  AST 70*  ALT 128*  ALKPHOS 59  BILITOT 1.0  PROT 5.4*  ALBUMIN 3.0*    Recent Labs Lab 12/18/15 0151  LIPASE 30   No results for input(s): AMMONIA in the last 168 hours.  CBC:  Recent Labs Lab 12/17/15 1802 12/18/15 0755 12/19/15 0524  WBC 15.3* 10.7* 9.4  NEUTROABS  --  5.9  --   HGB 15.9 14.3 13.6  HCT 48.6 43.8 40.3  MCV 89.8 91.1 87.8  PLT 305 229 211    Cardiac Enzymes:  Recent Labs Lab 12/18/15 0755 12/18/15 1323 12/18/15 2348  TROPONINI 0.03* 0.03* 0.03*  BNP: BNP (last 3 results)  Recent Labs  12/17/15 1938  BNP 2,890.3*    ProBNP (last 3 results) No results for input(s): PROBNP in the last 8760 hours.    Other results:  Imaging: Mr Card Morphology Wo/w Cm  Result Date: 12/19/2015 CLINICAL DATA:  Cardiomyopathy of uncertain etiology EXAM: CARDIAC MRI TECHNIQUE: The patient was scanned on a 1.5 Tesla GE magnet. A dedicated cardiac coil was used. Functional imaging was done using Fiesta sequences. 2,3, and 4 chamber views were done to assess for RWMA's. Modified Simpson's rule using a short axis stack was used to calculate an ejection fraction on a dedicated work [employer]. The patient received 30 cc of Multihance. After 10 minutes inversion recovery sequences were used to assess for infiltration and scar tissue. CONTRAST:  30 cc Multihance FINDINGS: Limited images of the lung fields show no gross abnormalities.  Small circumferential pericardial effusion. Severely dilated left ventricle with normal wall thickness. Diffuse hypokinesis, EF 16%. Mildly dilated right ventricle with moderately decreased systolic function. Moderate left atrial enlargement. Mild right atrial enlargement. Mild mitral regurgitation. Trileaflet aortic valve with no significant stenosis or regurgitation. On delayed enhancement imaging, there was late gadolinium enhancement (LGE) in the mid-wall of the inferoseptal RV insertion site in the basal and mid ventricle. MEASUREMENTS: MEASUREMENTS LV EDV 342 mL LV SV 56 mL LV EF 16% IMPRESSION: 1.  Severe LV dilation with EF 16%, diffuse hypokinesis. 2.  Mild RV dilation, moderately decreased RV systolic function. 3. LGE in the basal to mid inferoseptal RV insertion site. This is a nonspecific finding and often suggests volume overload/LV strain. Jowan Skillin Electronically Signed   By: Marca Ancona M.D.   On: 12/19/2015 21:21    Medications:     Scheduled Medications: . atorvastatin  20 mg Oral q1800  . digoxin  0.125 mg Oral Daily  . famotidine  20 mg Oral Daily  . folic acid  1 mg Oral Daily  . furosemide  80 mg Intravenous BID  . lisinopril  2.5 mg Oral Daily  . multivitamin with minerals  1 tablet Oral Daily  . nicotine  21 mg Transdermal Daily  . potassium chloride  40 mEq Oral BID  . rivaroxaban  20 mg Oral Q supper  . sodium chloride flush  10-40 mL Intracatheter Q12H  . sodium chloride flush  3 mL Intravenous Q12H  . spironolactone  12.5 mg Oral Daily  . thiamine  100 mg Oral Daily    Infusions: . milrinone 0.25 mcg/kg/min (12/20/15 0341)    PRN Medications: sodium chloride, ALPRAZolam, loperamide, ondansetron, sodium chloride flush, sodium chloride flush, zolpidem   Assessment:  1. Cardiogenic Shock 2. A/C Systolic Heart Failure- EF 10% RV moderately dilated  3. H/O Cardioembolic Stroke 10/2015  4. ADHD 5. Former Smoker   Plan/Discussion:   Suspect NICM  with viral etiology. CMRI EF ~16%.    Todays CO-OX is 72%. Will wean milrinone to 0.125 mcg.  No  BB with cardiogenic shock. Continue dig 0.125 mg daily. Appears euvolemic. CVP 6. Stop IV lasix and start lasix 40 mg twice a day.  Increase spiro to 25 mg daily. Stop lisinopril.  Switch to losartan 12.5 mg twice a day in anticipation of adding entresto.  Renal function stable.   Cardiac Rehab following.    Transfer to stepdown.   Length of Stay: 2  Amy Clegg NP-C  12/20/2015, 7:08 AM  Advanced Heart Failure Team Pager [phone] (M-F; 7a - 4p)  Please contact [facility] for night-coverage after hours (4p -7a ) and weekends on [email or web address]  Patient seen with NP, agree with the above note.  He is doing well. Good co-ox and CVP down.  Does not look volume overloaded at this point.  Cardiac MRI reviewed with patient, suspect viral myocarditis most likely cause of decline in EF.   - Lasix to po. - Wean milrinone.  - Losartan 12.5 mg bid, try to titrate up tomorrow.  - spironolactone to 25 daily.  - Continue digoxin.   Hopefully off milrinone tomorrow and home perhaps Sunday.  Will need close followup.    Marca Ancona 12/20/2015 8:22 AM

## 2015-12-20 NOTE — Progress Notes (Signed)
Transferred from 2Hroom 3 ambulatory.

## 2015-12-21 LAB — BASIC METABOLIC PANEL
Anion gap: 8 (ref 5–15)
BUN: 16 mg/dL (ref 6–20)
CHLORIDE: 102 mmol/L (ref 101–111)
CO2: 27 mmol/L (ref 22–32)
Calcium: 8.7 mg/dL — ABNORMAL LOW (ref 8.9–10.3)
Creatinine, Ser: 0.98 mg/dL (ref 0.61–1.24)
GFR calc Af Amer: >60 mL/min (ref >60)
GFR calc non Af Amer: >60 mL/min (ref >60)
GLUCOSE: 111 mg/dL — AB (ref 65–99)
POTASSIUM: 3.9 mmol/L (ref 3.5–5.1)
Sodium: 137 mmol/L (ref 135–145)

## 2015-12-21 LAB — CARBOXYHEMOGLOBIN
Carboxyhemoglobin: 1.6 % — ABNORMAL HIGH (ref 0.5–1.5)
METHEMOGLOBIN: 0.5 % (ref 0.0–1.5)
O2 Saturation: 77.7 %
Total hemoglobin: 14.6 g/dL (ref 13.5–18.0)

## 2015-12-21 MED ORDER — SACUBITRIL-VALSARTAN 24-26 MG PO TABS
1.0000 | ORAL_TABLET | Freq: Two times a day (BID) | ORAL | Status: DC
  Administered 2015-12-21 – 2015-12-23 (×5): 1 via ORAL
  Filled 2015-12-21 (×5): qty 1

## 2015-12-21 NOTE — Progress Notes (Signed)
CARDIAC REHAB PHASE I   PRE:  Rate/Rhythm: 101 ST  BP:  Supine:   Sitting: 110/78  Standing:    SaO2:   MODE:  Ambulation: 700 ft   POST:  Rate/Rhythm: 112 ST  BP:  Supine:   Sitting: 100/83  Standing:    SaO2:   1352-1406 Pt tolerated ambulation well without c/o, VSS stable. Encouraged to ambulate again later today with nursing staff. To bedside after walk,  Cristy Hilts

## 2015-12-21 NOTE — Progress Notes (Signed)
Advanced Heart Failure Rounding Note   Subjective:    On July 26th CO-OX was 31%. Started on milrinone 0.375 mcg.   Yesterday milrinone was cut back to 0.125 mcg  Todays CO-OX up to 77%. Diuresed well. CVP 9-10. SBP 105-115  Feels good. Walked 1100 ft. Denies SOB/Orthopnea.  CMRI - 1.  Severe LV dilation with EF 16%, diffuse hypokinesis. 2.  Mild RV dilation, moderately decreased RV systolic function. 3. LGE in the basal to mid inferoseptal RV insertion site. This is a nonspecific finding and often suggests volume overload/LV strain  Blood Cultures- NGTD Stool Negative C Diff  TSH 3.5  HIV NR   Objective:   Weight Range:  Vital Signs:   Temp:  [97.4 F (36.3 C)-98.6 F (37 C)] 97.4 F (36.3 C) (07/29 0847) Pulse Rate:  [93-102] 102 (07/29 0847) Resp:  [12-20] 16 (07/29 0847) BP: (96-113)/(72-85) 107/78 (07/29 0847) SpO2:  [95 %-98 %] 95 % (07/29 0411) Weight:  [86.6 kg (191 lb)-87 kg (191 lb 12.8 oz)] 86.6 kg (191 lb) (07/29 0800) Last BM Date: 12/20/15  Weight change: Filed Weights   12/20/15 0600 12/21/15 0500 12/21/15 0800  Weight: 86.1 kg (189 lb 14.4 oz) 87 kg (191 lb 12.8 oz) 86.6 kg (191 lb)    Intake/Output:   Intake/Output Summary (Last 24 hours) at 12/21/15 1138 Last data filed at 12/21/15 1100  Gross per 24 hour  Intake            578.5 ml  Output             1077 ml  Net           -498.5 ml     Physical Exam: CVP 9-10 General:  Well appearing. No resp difficulty. Lying flat in bed  HEENT: normal Neck: supple. JVP9. Carotids 2+ bilat; no bruits. No lymphadenopathy or thryomegaly appreciated. Cor: PMI nondisplaced.Tachy regular  No rubs, or murmurs. No s3 Lungs: clear Abdomen: soft, nontender, nondistended. No hepatosplenomegaly. No bruits or masses. Good bowel sounds. Extremities: no cyanosis, clubbing, rash, edema. RUE PICC Neuro: alert & orientedx3, cranial nerves grossly intact. moves all 4 extremities w/o difficulty. Affect  pleasant  Telemetry: NSR 80s   Labs: Basic Metabolic Panel:  Recent Labs Lab 12/18/15 0755 12/18/15 2348 12/19/15 0524 12/20/15 0335 12/21/15 0344  NA 135 135 136 138 137  K 4.0 3.5 3.4* 3.4* 3.9  CL 105 102 101 98* 102  CO2 20* 24 26 29 27   GLUCOSE 92 145* 164* 131* 111*  BUN 21* 20 20 19 16   CREATININE 1.38* 1.25* 1.26* 1.25* 0.98  CALCIUM 8.7* 8.5* 8.3* 8.7* 8.7*  MG 1.9  --   --   --   --     Liver Function Tests:  Recent Labs Lab 12/18/15 0226  AST 70*  ALT 128*  ALKPHOS 59  BILITOT 1.0  PROT 5.4*  ALBUMIN 3.0*    Recent Labs Lab 12/18/15 0151  LIPASE 30   No results for input(s): AMMONIA in the last 168 hours.  CBC:  Recent Labs Lab 12/17/15 1802 12/18/15 0755 12/19/15 0524  WBC 15.3* 10.7* 9.4  NEUTROABS  --  5.9  --   HGB 15.9 14.3 13.6  HCT 48.6 43.8 40.3  MCV 89.8 91.1 87.8  PLT 305 229 211    Cardiac Enzymes:  Recent Labs Lab 12/18/15 0755 12/18/15 1323 12/18/15 2348  TROPONINI 0.03* 0.03* 0.03*    BNP: BNP (last 3 results)  Recent Labs  12/17/15 1938  BNP 2,890.3*    ProBNP (last 3 results) No results for input(s): PROBNP in the last 8760 hours.    Other results:  Imaging: Mr Card Morphology Wo/w Cm  Result Date: 12/19/2015 CLINICAL DATA:  Cardiomyopathy of uncertain etiology EXAM: CARDIAC MRI TECHNIQUE: The patient was scanned on a 1.5 Tesla GE magnet. A dedicated cardiac coil was used. Functional imaging was done using Fiesta sequences. 2,3, and 4 chamber views were done to assess for RWMA's. Modified Simpson's rule using a short axis stack was used to calculate an ejection fraction on a dedicated work [employer]. The patient received 30 cc of Multihance. After 10 minutes inversion recovery sequences were used to assess for infiltration and scar tissue. CONTRAST:  30 cc Multihance FINDINGS: Limited images of the lung fields show no gross abnormalities. Small circumferential pericardial effusion.  Severely dilated left ventricle with normal wall thickness. Diffuse hypokinesis, EF 16%. Mildly dilated right ventricle with moderately decreased systolic function. Moderate left atrial enlargement. Mild right atrial enlargement. Mild mitral regurgitation. Trileaflet aortic valve with no significant stenosis or regurgitation. On delayed enhancement imaging, there was late gadolinium enhancement (LGE) in the mid-wall of the inferoseptal RV insertion site in the basal and mid ventricle. MEASUREMENTS: MEASUREMENTS LV EDV 342 mL LV SV 56 mL LV EF 16% IMPRESSION: 1.  Severe LV dilation with EF 16%, diffuse hypokinesis. 2.  Mild RV dilation, moderately decreased RV systolic function. 3. LGE in the basal to mid inferoseptal RV insertion site. This is a nonspecific finding and often suggests volume overload/LV strain. Dalton Mclean Electronically Signed   By: Marca Ancona M.D.   On: 12/19/2015 21:21    Medications:     Scheduled Medications: . atorvastatin  20 mg Oral q1800  . digoxin  0.125 mg Oral Daily  . famotidine  20 mg Oral Daily  . folic acid  1 mg Oral Daily  . furosemide  40 mg Oral BID  . losartan  12.5 mg Oral BID  . multivitamin with minerals  1 tablet Oral Daily  . nicotine  21 mg Transdermal Daily  . potassium chloride  40 mEq Oral BID  . rivaroxaban  20 mg Oral Q supper  . sodium chloride flush  10-40 mL Intracatheter Q12H  . sodium chloride flush  3 mL Intravenous Q12H  . spironolactone  25 mg Oral Daily  . thiamine  100 mg Oral Daily    Infusions: . milrinone 0.125 mcg/kg/min (12/21/15 0700)    PRN Medications: sodium chloride, ALPRAZolam, loperamide, ondansetron, sodium chloride flush, sodium chloride flush, zolpidem   Assessment:  1. Cardiogenic Shock 2. A/C Systolic Heart Failure- EF 10% RV moderately dilated  3. H/O Cardioembolic Stroke 10/2015  4. ADHD 5. 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 CMRI EF ~16%.   Todays CO-OX is  72%. Will stop milrinone and watch. Volume status mildly up. Will switch losartan to Entresto 24/26.  (last dose of lisinopril on 7/27 am) Watch BP closely.  No  BB with cardiogenic shock. Continue dig 0.125 mg daily and spiro 25 mg daily. Renal function stable. Hypokalemia improved. Continue to ambulate.   Will leave PICC in for at least 1 week.   Length of Stay: 3  Tripton Ned MD 12/21/2015, 11:38 AM  Advanced Heart Failure Team Pager [phone] (M-F; 7a - 4p)  Please contact [facility] for night-coverage after hours (4p -7a ) and weekends on [email or web address]

## 2015-12-22 LAB — BASIC METABOLIC PANEL
ANION GAP: 7 (ref 5–15)
BUN: 23 mg/dL — ABNORMAL HIGH (ref 6–20)
CHLORIDE: 101 mmol/L (ref 101–111)
CO2: 28 mmol/L (ref 22–32)
CREATININE: 1.05 mg/dL (ref 0.61–1.24)
Calcium: 9.1 mg/dL (ref 8.9–10.3)
GFR calc non Af Amer: >60 mL/min (ref >60)
Glucose, Bld: 103 mg/dL — ABNORMAL HIGH (ref 65–99)
POTASSIUM: 4.6 mmol/L (ref 3.5–5.1)
SODIUM: 136 mmol/L (ref 135–145)

## 2015-12-22 LAB — CARBOXYHEMOGLOBIN
Carboxyhemoglobin: 1.5 % (ref 0.5–1.5)
Methemoglobin: 0.6 % (ref 0.0–1.5)
O2 SAT: 73.9 %
Total hemoglobin: 17.1 g/dL (ref 13.5–18.0)

## 2015-12-22 NOTE — Progress Notes (Addendum)
Advanced Heart Failure Rounding Note   Subjective:    On July 26th CO-OX was 31%. Started on milrinone 0.375 mcg.   Yesterday milrinone was stopped. Losartan changed to entresto. SBP 90-105  Todays CO-OX 74%. CVP 10   Feels good. Walked 700 ft. Denies SOB/Orthopnea.   CMRI - 1.  Severe LV dilation with EF 16%, diffuse hypokinesis. 2.  Mild RV dilation, moderately decreased RV systolic function. 3. LGE in the basal to mid inferoseptal RV insertion site. This is a nonspecific finding and often suggests volume overload/LV strain  Blood Cultures- NGTD Stool Negative C Diff  TSH 3.5  HIV NR   Objective:   Weight Range:  Vital Signs:   Temp:  [97.4 F (36.3 C)-98.4 F (36.9 C)] 98.3 F (36.8 C) (07/30 1113) Pulse Rate:  [101] 101 (07/30 1006) Resp:  [16-20] 18 (07/30 1113) BP: (90-104)/(63-82) 104/79 (07/30 1113) SpO2:  [93 %-97 %] 97 % (07/30 1113) Weight:  [86 kg (189 lb 9.5 oz)] 86 kg (189 lb 9.5 oz) (07/30 0444) Last BM Date: 12/21/15  Weight change: Filed Weights   12/21/15 0500 12/21/15 0800 12/22/15 0444  Weight: 87 kg (191 lb 12.8 oz) 86.6 kg (191 lb) 86 kg (189 lb 9.5 oz)    Intake/Output:   Intake/Output Summary (Last 24 hours) at 12/22/15 1224 Last data filed at 12/21/15 2130  Gross per 24 hour  Intake              510 ml  Output             1455 ml  Net             -945 ml     Physical Exam: CVP 10 General:  Well appearing. No resp difficulty. Lying flat in bed  HEENT: normal Neck: supple. JVP 10. Carotids 2+ bilat; no bruits. No lymphadenopathy or thryomegaly appreciated. Cor: PMI nondisplaced.Tachy regular  No rubs, or murmurs. No s3 Lungs: clear Abdomen: soft, nontender, nondistended. No hepatosplenomegaly. No bruits or masses. Good bowel sounds. Extremities: no cyanosis, clubbing, rash, edema. RUE PICC Neuro: alert & orientedx3, cranial nerves grossly intact. moves all 4 extremities w/o difficulty. Affect pleasant  Telemetry: S tach  100   Labs: Basic Metabolic Panel:  Recent Labs Lab 12/18/15 0755 12/18/15 2348 12/19/15 0524 12/20/15 0335 12/21/15 0344 12/22/15 0354  NA 135 135 136 138 137 136  K 4.0 3.5 3.4* 3.4* 3.9 4.6  CL 105 102 101 98* 102 101  CO2 20* GLUCOSE 92 145* 164* 131* 111* 103*  BUN 21* 23*  CREATININE 1.38* 1.25* 1.26* 1.25* 0.98 1.05  CALCIUM 8.7* 8.5* 8.3* 8.7* 8.7* 9.1  MG 1.9  --   --   --   --   --     Liver Function Tests:  Recent Labs Lab 12/18/15 0226  AST 70*  ALT 128*  ALKPHOS 59  BILITOT 1.0  PROT 5.4*  ALBUMIN 3.0*    Recent Labs Lab 12/18/15 0151  LIPASE 30   No results for input(s): AMMONIA in the last 168 hours.  CBC:  Recent Labs Lab 12/17/15 1802 12/18/15 0755 12/19/15 0524  WBC 15.3* 10.7* 9.4  NEUTROABS  --  5.9  --   HGB 15.9 14.3 13.6  HCT 48.6 43.8 40.3  MCV 89.8 91.1 87.8  PLT 305 229 211    Cardiac Enzymes:  Recent Labs Lab 12/18/15 0755 12/18/15 1323 12/18/15 2348  TROPONINI 0.03* 0.03* 0.03*    BNP: BNP (last 3 results)  Recent Labs  12/17/15 1938  BNP 2,890.3*    ProBNP (last 3 results) No results for input(s): PROBNP in the last 8760 hours.    Other results:  Imaging: No results found.   Medications:     Scheduled Medications: . atorvastatin  20 mg Oral q1800  . digoxin  0.125 mg Oral Daily  . famotidine  20 mg Oral Daily  . folic acid  1 mg Oral Daily  . furosemide  40 mg Oral BID  . multivitamin with minerals  1 tablet Oral Daily  . nicotine  21 mg Transdermal Daily  . potassium chloride  40 mEq Oral BID  . rivaroxaban  20 mg Oral Q supper  . sacubitril-valsartan  1 tablet Oral BID  . sodium chloride flush  10-40 mL Intracatheter Q12H  . sodium chloride flush  3 mL Intravenous Q12H  . spironolactone  25 mg Oral Daily  . thiamine  100 mg Oral Daily    Infusions:    PRN Medications: sodium chloride, ALPRAZolam, loperamide, ondansetron, sodium chloride flush,  sodium chloride flush, zolpidem   Assessment:  1. Cardiogenic Shock 2. A/C Systolic Heart Failure- EF 10% RV moderately dilated  3. H/O Cardioembolic Stroke 10/2015  4. ADHD 5. 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 CMRI EF ~16%.   Now off milrinone. Co-ox stable but still tachy and CVP up. Will continue current regimen. Would not start b-blocker until outpatient. Repeat co-ox in am. Likely home tomorrow with close f/u in [facility] Clinic. Stop KCL. Continue Xarelto.    Will leave PICC in for at least 1 week.   Length of Stay: 4  Lenell Lama MD 12/22/2015, 12:24 PM  Advanced Heart Failure Team Pager [phone] (M-F; 7a - 4p)  Please contact [facility] for night-coverage after hours (4p -7a ) and weekends on [email or web address]

## 2015-12-23 LAB — BASIC METABOLIC PANEL
ANION GAP: 7 (ref 5–15)
BUN: 22 mg/dL — ABNORMAL HIGH (ref 6–20)
CALCIUM: 8.9 mg/dL (ref 8.9–10.3)
CHLORIDE: 102 mmol/L (ref 101–111)
CO2: 24 mmol/L (ref 22–32)
Creatinine, Ser: 1.02 mg/dL (ref 0.61–1.24)
GFR calc non Af Amer: >60 mL/min (ref >60)
Glucose, Bld: 102 mg/dL — ABNORMAL HIGH (ref 65–99)
POTASSIUM: 4.7 mmol/L (ref 3.5–5.1)
Sodium: 133 mmol/L — ABNORMAL LOW (ref 135–145)

## 2015-12-23 LAB — CBC
HEMATOCRIT: 54.1 % — AB (ref 39.0–52.0)
HEMOGLOBIN: 18 g/dL — AB (ref 13.0–17.0)
MCH: 29.9 pg (ref 26.0–34.0)
MCHC: 33.3 g/dL (ref 30.0–36.0)
MCV: 89.9 fL (ref 78.0–100.0)
Platelets: 258 10*3/uL (ref 150–400)
RBC: 6.02 MIL/uL — AB (ref 4.22–5.81)
RDW: 12.8 % (ref 11.5–15.5)
WBC: 11.1 10*3/uL — ABNORMAL HIGH (ref 4.0–10.5)

## 2015-12-23 LAB — CULTURE, BLOOD (ROUTINE X 2)
CULTURE: NO GROWTH
CULTURE: NO GROWTH

## 2015-12-23 LAB — CARBOXYHEMOGLOBIN
CARBOXYHEMOGLOBIN: 1 % (ref 0.5–1.5)
Methemoglobin: 0.6 % (ref 0.0–1.5)
O2 Saturation: 85.5 %
Total hemoglobin: 18.4 g/dL — ABNORMAL HIGH (ref 13.5–18.0)

## 2015-12-23 MED ORDER — ALPRAZOLAM 0.25 MG PO TABS: 0.2500 mg | ORAL_TABLET | Freq: Two times a day (BID) | ORAL | 0 refills | Status: DC | PRN

## 2015-12-23 MED ORDER — DIGOXIN 125 MCG PO TABS: 0.1250 mg | ORAL_TABLET | Freq: Every day | ORAL | 6 refills | Status: DC

## 2015-12-23 MED ORDER — RIVAROXABAN 20 MG PO TABS: 20.0000 mg | ORAL_TABLET | Freq: Every day | ORAL | 11 refills | Status: DC

## 2015-12-23 MED ORDER — SACUBITRIL-VALSARTAN 24-26 MG PO TABS: 1.0000 | ORAL_TABLET | Freq: Two times a day (BID) | ORAL | 6 refills | Status: DC

## 2015-12-23 MED ORDER — ZOLPIDEM TARTRATE 5 MG PO TABS: 5.0000 mg | ORAL_TABLET | Freq: Every evening | ORAL | 0 refills | Status: DC | PRN

## 2015-12-23 MED ORDER — FUROSEMIDE 20 MG PO TABS: 40.0000 mg | ORAL_TABLET | Freq: Two times a day (BID) | ORAL | 6 refills | Status: DC

## 2015-12-23 MED ORDER — ATORVASTATIN CALCIUM 20 MG PO TABS: 20.0000 mg | ORAL_TABLET | Freq: Every day | ORAL | 6 refills | Status: DC

## 2015-12-23 MED ORDER — NICOTINE 21 MG/24HR TD PT24: 21.0000 mg | MEDICATED_PATCH | Freq: Every day | TRANSDERMAL | 0 refills | Status: DC

## 2015-12-23 MED ORDER — LOPERAMIDE HCL 2 MG PO CAPS: 2.0000 mg | ORAL_CAPSULE | ORAL | 0 refills | Status: DC | PRN

## 2015-12-23 MED ORDER — HEPARIN SOD (PORK) LOCK FLUSH 100 UNIT/ML IV SOLN
250.0000 [IU] | INTRAVENOUS | Status: AC | PRN
  Administered 2015-12-23: 250 [IU]

## 2015-12-23 MED ORDER — DOXYLAMINE SUCCINATE (SLEEP) 25 MG PO TABS: 25.0000 mg | ORAL_TABLET | Freq: Every evening | ORAL | 0 refills | Status: DC | PRN

## 2015-12-23 MED ORDER — SPIRONOLACTONE 25 MG PO TABS: 25.0000 mg | ORAL_TABLET | Freq: Every day | ORAL | 6 refills | Status: DC

## 2015-12-23 MED ORDER — HEPARIN SOD (PORK) LOCK FLUSH 100 UNIT/ML IV SOLN
250.0000 [IU] | INTRAVENOUS | Status: DC | PRN
  Administered 2015-12-23: 250 [IU]

## 2015-12-23 NOTE — Progress Notes (Signed)
Heart Failure Navigator Consult Note  Presentation: Austin Tran is a 30 y.o. male with medical history significant of systolic congestive heart failure with EF 20-25 percent, tobacco abuse, recently diagnosed stroke, GERD, ADHD, who presents with nausea, vomiting, diarrhea, abdominal pain, dizziness, near syncope and shortness of breath.  Patient reports that he has been having nausea, vomiting, diarrhea, abdominal pain for about 7 days. He has 5-6 times of watery diarrhea each day, vomited while twice each day without blood in the vomitus. He also has abdominal pain, which is located in epigastric area and RUQ. It is constant, 2 out of 10 in series 2 currently, nonradiating. Patient states that he has worsening shortness of breath, which is worse when lying down. Has dry cough, no chest pain, fever or chills. Denies symptoms of UTI or new unilateral weakness. No vision change or hearing loss. Pt states that he has not taken his lasix for about 7 days.    Past Medical History:  Diagnosis Date  . ADHD (attention deficit hyperactivity disorder)    on adderall since 2004  . Chronic systolic (congestive) heart failure (HCC)   . GERD (gastroesophageal reflux disease)   . HLD (hyperlipidemia)   . Hypertension   . Stroke (HCC)   . Tobacco abuse     Social History   Social History  . Marital status: Single    Spouse name: N/A  . Number of children: N/A  . Years of education: N/A   Social History Main Topics  . Smoking status: Current Some Day Smoker    Types: Cigarettes  . Smokeless tobacco: Never Used  . Alcohol use 7.2 oz/week    12 Cans of beer per week     Comment: drink 4-5 beers a night, more on the weekend  . Drug use: No  . Sexual activity: Not Asked   Other Topics Concern  . None   Social History Narrative  . None    ECHO:Study Conclusions-12/18/15  - Left ventricle: The cavity size was moderately dilated. Wall   thickness was normal. Systolic function was  severely reduced. The   estimated ejection fraction was in the range of 10% to 15%.   Diffuse hypokinesis. Doppler parameters are consistent with a   reversible restrictive pattern, indicative of decreased left   ventricular diastolic compliance and/or increased left atrial   pressure (grade 3 diastolic dysfunction). There was spontaneous   echo contrast, indicative of stasis. No evidence of thrombus. - Ventricular septum: The contour showed diastolic flattening. - Mitral valve: There was mild regurgitation. - Left atrium: The atrium was mildly dilated. - Right ventricle: Systolic function was moderately reduced. - Tricuspid valve: There was moderate regurgitation.  Impressions:  - Since last echocardiogram, EF is reduced (prior 20-25%).  ------------------------------------------------------------------- Study data:  Comparison was made to the study of 11/21/2015.  Study status:  Routine.  Procedure:  The patient reported no pain pre or post test. Transthoracic echocardiography. Image quality was adequate. Intravenous contrast (Definity) was administered.  Study completion:  There were no complications.          Transthoracic echocardiography.  M-mode, complete 2D, spectral Doppler, and color Doppler.  Birthdate:  Patient birthdate: 02-19-1986.  Age:  Patient is 30 yr old.  Sex:  Gender: male.    BMI: 28.7 kg/m^2.  Blood pressure:     94/60  Patient status:  Inpatient.  Study date: Study date: 12/18/2015. Study time: 10:11 AM.  Location:  Bedside.     BNP  Component Value Date/Time   BNP 2,890.3 (H) 12/17/2015 1938    ProBNP No results found for: PROBNP   Education Assessment and Provision:  Detailed education and instructions provided on heart failure disease management including the following:  Signs and symptoms of Heart Failure When to call the physician Importance of daily weights Low sodium diet Fluid restriction Medication management Anticipated future  follow-up appointments  Patient education given on each of the above topics.  Patient acknowledges understanding and acceptance of all instructions.  I spoke with Mr. Witts regarding his hospitalization and recent diagnosis of HF.  He was recently admitted with a CVA and was told at that time his heart was "weak".   I reviewed HF recommendations for home.  He tells me that he has a scale and had previously been unsure of his "base/dry weight" and how to use weights as a tool.   I asked him to use his weight in the AM (after todays discharge) as his base weight on his scale--I reviewed when to call the physician related to weight increases and signs/symptoms.  I reviewed a low sodium diet and high sodium foods to avoid.  He denies any issues with getting or taking medications -however does mention that he had to change meds before admission related to BP and noted in chart that he was not taking Lasix?  I reinforced the importance of taking all medications and not stopping anything without calling the AHF Clinic.  He will follow-up with the AHF Clinic.    Education Materials:  "Living Better With Heart Failure" Booklet, Daily Weight Tracker Tool   High Risk Criteria for Readmission and/or Poor Patient Outcomes:  (Recommend Follow-up with Advanced Heart Failure Clinic)   EF <30%- yes 10-15% with grade 3 dias dys  2 or more admissions in 6 months-yes 2/94mo  Difficult social situation- No  Demonstrates medication noncompliance- No denies.    Barriers of Care:  New HF, Health Literacy, Knowledge and compliance.  Discharge Planning:   Plans to return to home with his girlfriend in [place].  He will need ongoing HF education, compliance reinforcement and symptom recognition.

## 2015-12-23 NOTE — Progress Notes (Signed)
Spoke w pt. Gave him entresto 30day free card and 10.00 copay card. He will go home w picc line. Went over [employer] and no pref. Will get adv homecare to do picc line care and chf follow up. For dc today pt hopes.

## 2015-12-23 NOTE — Progress Notes (Signed)
Advanced Heart Failure Rounding Note   Subjective:    On July 26th CO-OX was 31%. Started on milrinone 0.375 mcg.   Milrinone stopped 12/21/15. Now on Entresto. SBP 90-104  Todays CO-OX 85.5%. CVP 4-5 per nurse, currently disconnected.  Out 2 L but no input recorded.  Feels good this morning. Per nurse got slightly anxious after watching HF video over the weekend.  Paced the halls.  Wants to go home. Knows to limit his fluids to 2 liters.   CMRI - 1.  Severe LV dilation with EF 16%, diffuse hypokinesis. 2.  Mild RV dilation, moderately decreased RV systolic function. 3. LGE in the basal to mid inferoseptal RV insertion site. This is a nonspecific finding and often suggests volume overload/LV strain  Blood Cultures- NGTD Stool Negative C Diff  TSH 3.5  HIV NR   Objective:   Weight Range:  Vital Signs:   Temp:  [96 F (35.6 C)-98.3 F (36.8 C)] 98.2 F (36.8 C) (07/31 0313) Pulse Rate:  [101-108] 104 (07/30 2339) Resp:  [16-18] 16 (07/31 0313) BP: (90-104)/(55-79) 96/64 (07/31 0313) SpO2:  [96 %-97 %] 96 % (07/30 2339) Weight:  [190 lb (86.2 kg)] 190 lb (86.2 kg) (07/31 0313) Last BM Date: 12/22/15  Weight change: Filed Weights   12/21/15 0800 12/22/15 0444 12/23/15 0313  Weight: 191 lb (86.6 kg) 189 lb 9.5 oz (86 kg) 190 lb (86.2 kg)    Intake/Output:   Intake/Output Summary (Last 24 hours) at 12/23/15 0742 Last data filed at 12/22/15 2252  Gross per 24 hour  Intake                0 ml  Output             2175 ml  Net            -2175 ml     Physical Exam: CVP 4-5 General:  Well appearing. No resp difficulty. Lying flat in bed  HEENT: normal Neck: supple. JVP 4-5. Carotids 2+ bilat; no bruits. No thyromegaly or nodule noted.  Cor: PMI nondisplaced. Regular, slightly tachy. No murmurs or rubs noted. No s3 Lungs: CTAB, normal effort. Abdomen: soft, NT, ND, no HSM. No bruits or masses. +BS  Extremities: no cyanosis, clubbing, rash, No peripheral  edema. RUE PICC Neuro: alert & orientedx3, cranial nerves grossly intact. moves all 4 extremities w/o difficulty. Affect pleasant  Telemetry: Reviewed personally, S-tach 100s   Labs: Basic Metabolic Panel:  Recent Labs Lab 12/18/15 0755  12/19/15 0524 12/20/15 0335 12/21/15 0344 12/22/15 0354 12/23/15 0243  NA 135  < > 136 138 137 136 133*  K 4.0  < > 3.4* 3.4* 3.9 4.6 4.7  CL 105  < > 101 98* 102 101 102  CO2 20*  < > 26 29 27 28 24   GLUCOSE 92  < > 164* 131* 111* 103* 102*  BUN 21*  < > 20 19 16  23* 22*  CREATININE 1.38*  < > 1.26* 1.25* 0.98 1.05 1.02  CALCIUM 8.7*  < > 8.3* 8.7* 8.7* 9.1 8.9  MG 1.9  --   --   --   --   --   --   < > = values in this interval not displayed.  Liver Function Tests:  Recent Labs Lab 12/18/15 0226  AST 70*  ALT 128*  ALKPHOS 59  BILITOT 1.0  PROT 5.4*  ALBUMIN 3.0*    Recent Labs Lab 12/18/15 0151  LIPASE 30  No results for input(s): AMMONIA in the last 168 hours.  CBC:  Recent Labs Lab 12/17/15 1802 12/18/15 0755 12/19/15 0524 12/23/15 0243  WBC 15.3* 10.7* 9.4 11.1*  NEUTROABS  --  5.9  --   --   HGB 15.9 14.3 13.6 18.0*  HCT 48.6 43.8 40.3 54.1*  MCV 89.8 91.1 87.8 89.9  PLT 305 229 211 258    Cardiac Enzymes:  Recent Labs Lab 12/18/15 0755 12/18/15 1323 12/18/15 2348  TROPONINI 0.03* 0.03* 0.03*    BNP: BNP (last 3 results)  Recent Labs  12/17/15 1938  BNP 2,890.3*    ProBNP (last 3 results) No results for input(s): PROBNP in the last 8760 hours.    Other results:  Imaging: No results found.   Medications:     Scheduled Medications: . atorvastatin  20 mg Oral q1800  . digoxin  0.125 mg Oral Daily  . famotidine  20 mg Oral Daily  . folic acid  1 mg Oral Daily  . furosemide  40 mg Oral BID  . multivitamin with minerals  1 tablet Oral Daily  . nicotine  21 mg Transdermal Daily  . rivaroxaban  20 mg Oral Q supper  . sacubitril-valsartan  1 tablet Oral BID  . sodium chloride  flush  10-40 mL Intracatheter Q12H  . sodium chloride flush  3 mL Intravenous Q12H  . spironolactone  25 mg Oral Daily  . thiamine  100 mg Oral Daily    Infusions:    PRN Medications: sodium chloride, ALPRAZolam, loperamide, ondansetron, sodium chloride flush, sodium chloride flush, zolpidem   Assessment:  1. Cardiogenic Shock 2. A/C Systolic Heart Failure- EF 10% RV moderately dilated  3. H/O Cardioembolic Stroke 10/2015  4. ADHD 5. 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 CMRI EF ~16%.   Remains stable off milrinone. Co-ox stable but remains slightly tachycardic. Will re-connect CVP and recheck prior to d/c. No K supp.   Continue current regimen. No BB for now with recent low output. Will look at starting as outpatient.   Likely home today with close f/u in [facility] Clinic.   Continue Xarelto.    Will leave PICC in for at least 1 week. Will recheck coox and plan on removal in clinic if stable.   Length of Stay: [address]  Luane School 12/23/2015, 7:42 AM  Advanced Heart Failure Team Pager [phone] (M-F; 7a - 4p)  Please contact [facility] for night-coverage after hours (4p -7a ) and weekends on [email or web address]  Patient seen with PA, agree with the above note.  Doing well today, ready to go home.  Will go home on the above regimen.  Will keep PICC in 1 week until followup, repeat co-ox at that time.    Austin Tran 12/23/2015 8:55 AM

## 2015-12-23 NOTE — Discharge Summary (Signed)
Advanced Heart Failure Discharge Note   Discharge Summary   Patient ID: [place] Tran MRN: [id number], DOB/AGE: Oct 04, 1985 30 y.o. Admit date: 12/17/2015 D/C date:     12/23/2015   Primary Discharge Diagnoses:  1. Cardiogenic shock 2. A/C Systolic HF - EF 40% moderately dilated / ? Viral CM 3. 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 4. ADHD/Anxiety 5. Former Smoker    Hospital Course:   Austin Tran is a 30 year old with a history of ADHD, CVA cardioembolic June 2017, chronic systolic heart failure diagnosed June 2017, and smoker. Drinks 4-5 beers a week.     In June 2017, cardioembolic stroke. Echocardiogram obtained on 11/21/2015 however showed EF 20-25%, severe dilatation of the left ventricle, mild [place], moderate left atrial enlargement, moderate RV enlargement with reduced RV function. Cardiology was consulted for TEE in the setting of stroke and also LV dysfunction. TEE obtained on 11/22/2015 showed severe LV dysfunction with EF 15%, severe RV dysfunction.  Admitted 12/17/15 with N/V/diarrhea/presyncope/dyspnea.  CT angio - negative for PE. CXR no acute findings. C diff negative. HF team consulted with complaints of fatigue and mild nausea.    Echo 12/17/15 showed EF dropped from 20-25% to 10% over 1 month. 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   Initial Coox 31% consistent with severe cardiogenic shock. Started on milrinone 0.375 mcg with coox up to 74%. BB stopped. Brisk diuresis with IV lasix after addition of inotropes. States it was the "best he felt in weeks".  FOBT +. Hgb remained stable throughout admission. No overt bleeding.   cMRI 12/19/15 1. Severe LV dilation with EF 16%, diffuse hypokinesis. 2. Mild RV dilation, moderately decreased RV systolic function. 3. LGE in the basal to mid inferoseptal RV insertion site. This is a nonspecific finding and often suggests volume overload/LV strain  Milrinone weaned and medications adjusted as tolerated.  Losartan switched to Entresto.  K stopped with K > 4.5 and Entresto.  He was successfully weaned off milrinone completely and remained stable symptomatically for discharge.   Overall he diuresed 12.2 L and down 15 lbs from admission. He will be discharged home today in stable condition.  We will maintain PICC at this time with significant cardiac output. Plan to check Coox at clinic next week and remove PICC if stable.   Discharge Weight Range: 190 lbs Discharge Vitals: Blood pressure 110/81, pulse 100, temperature 97.5 F (36.4 C), temperature source Oral, resp. rate 19, height 5\' 11"  (1.803 m), weight 190 lb (86.2 kg), SpO2 99 %.  Labs: Lab Results  Component Value Date   WBC 11.1 (H) 12/23/2015   HGB 18.0 (H) 12/23/2015   HCT 54.1 (H) 12/23/2015   MCV 89.9 12/23/2015   PLT 258 12/23/2015    Recent Labs Lab 12/18/15 0226  12/23/15 0243  NA  --   < > 133*  K  --   < > 4.7  CL  --   < > 102  CO2  --   < > 24  BUN  --   < > 22*  CREATININE  --   < > 1.02  CALCIUM  --   < > 8.9  PROT 5.4*  --   --   BILITOT 1.0  --   --   ALKPHOS 59  --   --   ALT 128*  --   --   AST 70*  --   --   GLUCOSE  --   < >  102*  < > = values in this interval not displayed. Lab Results  Component Value Date   CHOL 196 11/22/2015   HDL 48 11/22/2015   LDLCALC 128 (H) 11/22/2015   TRIG 102 11/22/2015   BNP (last 3 results)  Recent Labs  12/17/15 1938  BNP 2,890.3*    ProBNP (last 3 results) No results for input(s): PROBNP in the last 8760 hours.   Diagnostic Studies/Procedures   No results found.  Discharge Medications     Medication List    STOP taking these medications   carvedilol 6.25 MG tablet Commonly known as:  COREG   lisinopril 5 MG tablet Commonly known as:  PRINIVIL,ZESTRIL     TAKE these medications   ALPRAZolam 0.25 MG tablet Commonly known as:  XANAX Take 1 tablet (0.25 mg total) by mouth 2 (two) times daily as needed for anxiety.   atorvastatin 20 MG  tablet Commonly known as:  LIPITOR Take 1 tablet (20 mg total) by mouth daily at 6 PM.   digoxin 0.125 MG tablet Commonly known as:  LANOXIN Take 1 tablet (0.125 mg total) by mouth daily.   doxylamine (Sleep) 25 MG tablet Commonly known as:  UNISOM Take 1 tablet (25 mg total) by mouth at bedtime as needed for sleep.   folic acid 1 MG tablet Commonly known as:  FOLVITE Take 1 tablet (1 mg total) by mouth daily.   furosemide 20 MG tablet Commonly known as:  LASIX Take 2 tablets (40 mg total) by mouth 2 (two) times daily. What changed:  how much to take  when to take this   loperamide 2 MG capsule Commonly known as:  IMODIUM Take 1 capsule (2 mg total) by mouth as needed for diarrhea or loose stools.   multivitamin with minerals Tabs tablet Take 1 tablet by mouth daily.   nicotine 21 mg/24hr patch Commonly known as:  NICODERM CQ - dosed in mg/24 hours Place 1 patch (21 mg total) onto the skin daily.   pantoprazole 40 MG tablet Commonly known as:  PROTONIX Take 1 tablet (40 mg total) by mouth daily.   rivaroxaban 20 MG Tabs tablet Commonly known as:  XARELTO Take 1 tablet (20 mg total) by mouth daily with supper.   sacubitril-valsartan 24-26 MG Commonly known as:  ENTRESTO Take 1 tablet by mouth 2 (two) times daily.   spironolactone 25 MG tablet Commonly known as:  ALDACTONE Take 1 tablet (25 mg total) by mouth daily.   thiamine 100 MG tablet Take 1 tablet (100 mg total) by mouth daily.   zolpidem 5 MG tablet Commonly known as:  AMBIEN Take 1 tablet (5 mg total) by mouth at bedtime as needed for sleep.       Disposition   The patient will be discharged in stable condition to home. Discharge Instructions    Amb Referral to Cardiac Rehabilitation    Complete by:  As directed   Diagnosis:  Heart Failure (see criteria below if ordering Phase II)   Heart Failure Type:  Chronic Systolic & Diastolic   Diet - low sodium heart healthy    Complete by:  As  directed   Increase activity slowly    Complete by:  As directed     Follow-up Information    Arvilla Meres, MD Follow up on 12/31/2015.   Specialty:  Cardiology Why:  at 140 for post hospital follow up.  Please bring all of your medications to your visit. The code for parking is  Nohea.[place] information: [address] [place] [address] [phone]             Duration of Discharge Encounter: Greater than 35 minutes   Signed, Graciella Freer PA-C 12/23/2015, 2:41 PM

## 2015-12-23 NOTE — Progress Notes (Signed)
Patient discharged per orders. Patient's significant other at the bedside during d/c teaching/instructions. Medications, prescriptions, follow up appointments, home care, PICC care discussed. Pt verbalized understanding of diet and weighing self daily in regards to his heart failure. Pt verbalized understanding of when to call the doctor for new onset weight gain, shortness of breath, etc. Time allowed for questions and concerns. Patient and his S.O denied having any questions or concerns. IV nurse flushed and cap PICC line. Patient able to ambulate in room and pack up room independently. Pt declined wheel chair for d/c. Pt ambulated to the main entrance independently.  Asher Muir Deontez Klinke,RN

## 2015-12-24 ENCOUNTER — Telehealth: Payer: Self-pay | Admitting: Licensed Clinical Social Worker

## 2015-12-24 NOTE — Telephone Encounter (Signed)
CSW referred to assist with insurance and disability options. 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 Patient has no sick benefits or disability benefits. He is unsure how long he will be out of work and currently has insurance through [employer]. CSW encouraged patient to follow up with [employer] to inquire about lowering monthly premium. CSW also discussed disability and process for application as well as medicaid and  discount program for options to assist with unpaid medical bills. Patient has an appointment next week in the HF clinic and patient and father will follow up with CSW at that time. CSW will be available as needed. Lasandra Beech, LCSW [phone]

## 2015-12-25 ENCOUNTER — Encounter: Payer: Self-pay | Admitting: Neurology

## 2015-12-25 ENCOUNTER — Ambulatory Visit (INDEPENDENT_AMBULATORY_CARE_PROVIDER_SITE_OTHER): Payer: BLUE CROSS/BLUE SHIELD | Admitting: Neurology

## 2015-12-25 VITALS — BP 90/62 | HR 60 | Ht 70.0 in | Wt 187.0 lb

## 2015-12-25 DIAGNOSIS — B3321 Viral endocarditis: Secondary | ICD-10-CM | POA: Diagnosis not present

## 2015-12-25 DIAGNOSIS — I5041 Acute combined systolic (congestive) and diastolic (congestive) heart failure: Secondary | ICD-10-CM

## 2015-12-25 DIAGNOSIS — R0683 Snoring: Secondary | ICD-10-CM | POA: Diagnosis not present

## 2015-12-25 DIAGNOSIS — I509 Heart failure, unspecified: Secondary | ICD-10-CM | POA: Insufficient documentation

## 2015-12-25 NOTE — Progress Notes (Signed)
SLEEP MEDICINE CLINIC   Provider:  Melvyn Novas, M D  Referring Provider: Gaspar Garbe, MD Primary Care Physician:  Gaspar Garbe, MD  Chief Complaint  Patient presents with  . New Patient (Initial Visit)    snores, family history of osa, never had sleep study    HPI:  [place] Tran is a 30 y.o. male , seen here as a referral from Dr. Guerry Bruin for sleep evaluation,   Chief complaint according to patient : " I was hospitalized in July and another time last week, was discharged on Monday, 12-23-2015 ".  [place] Tran had been in June hospitalized with a viral endocarditis, reducing his ejection fraction at the time to 20-25 percent and requiring anticoagulation to be implemented. He suffered a left temporal stroke most likely embolic and was hospitalized at [facility]. Carotid Doppler studies, a CT angiogram of head and neck were all within normal limits, and he was placed on anticoagulation and statins. The left temporal infarct manifested an expressive aphasia , noted when he difficulties to read and text on his mobile device at 11 AM, ( he worked late the night before and had a night cap , felt short of breath at night , when walking to his car.) - but at 11 AM he felt he was unable to speak. also felt dehydrated, but fluid intake didn't change the symptoms- many of  which have now completely  resolved. Dr Wylene Simmer also reported the patient uses alcohol heavily and was using amphetamines.   He remembers having a nonproductive cough for almost 2 months stretching through April and May, and in retrospect this was probably the time he contracted a virus.   Sleep habits are as follows: Since his last discharge things have gotten a lot better he reports. He had insomnia before the fluid in his lungs and heart was removed. He was placed on diuretics during his hospitalization," to dry up ". He comes from a long line of CPAP users. His sister-in-law witnessed during a  trip to the beach that [place] Tran was breathing irregularly, seemingly pausing and snored loudly. She has also noticed him talking sleep, laughing in his sleep. He feels that he has freedom to move air now is not as air hungry or restricted, and right now is not back at work yet. His current bedtime is 12 midnight, he can fall asleep more promptly and the hospitalist prescribed a sleep aid. The night until about 8 or 9 AM. He does not have nocturia now. He wakes up feeling more refreshed and restored than prior to his hospitalization. He sleeps on one pillow and prone.   Sleep medical history and family sleep history:  Father, brother an grandfather are using CPAP.   Social history:  Works at [employer], server, and late nights. ETOH; none since the cardiomyopthy was discovered, before he has 3-6 beers after work.  He uses no tobacco in any form, Caffeine - none. adderall d/c .   Review of Systems: Out of a complete 14 system review, the patient complains of only the following symptoms, and all other reviewed systems are negative. ADHD, ADD, aphasia, chest tightness, air hungry, dizziness. Snoring. Sleep talking. Hypoxemia during sleep in hospital.  BP and HR changing, apnea.   Epworth score 10 ,  How likely are you to doze in the following situations: 0 = not likely, 1 = slight chance, 2 = moderate chance, 3 = high chance  Sitting and Reading?2  Watching Television?2 Sitting inactive in a public place (theater or meeting)?1 Lying down in the afternoon when circumstances permit?3 Sitting and talking to someone?0 Sitting quietly after lunch without alcohol?0 In a car, while stopped for a few minutes in traffic?0 As a passenger in a car for an hour without a break?2  Total = 10    Fatigue severity score 47   , depression score 2/15    Social History   Social History  . Marital status: Single    Spouse name: N/A  . Number of children: N/A  . Years of education: N/A    Occupational History  . Not on file.   Social History Main Topics  . Smoking status: Current Some Day Smoker    Types: Cigarettes  . Smokeless tobacco: Never Used  . Alcohol use 7.2 oz/week    12 Cans of beer per week     Comment: drink 4-5 beers a night, more on the weekend  . Drug use: No  . Sexual activity: Not on file   Other Topics Concern  . Not on file   Social History Narrative  . No narrative on file    Family History  Problem Relation Age of Onset  . Hypertension Mother   . Hypertension Father   . Cancer Maternal Grandmother     Past Medical History:  Diagnosis Date  . ADHD (attention deficit hyperactivity disorder)    on adderall since 2004  . Chronic systolic (congestive) heart failure (HCC)   . GERD (gastroesophageal reflux disease)   . HLD (hyperlipidemia)   . Hypertension   . Stroke (HCC)   . Tobacco abuse     Past Surgical History:  Procedure Laterality Date  . EYE SURGERY    . KNEE SURGERY    . TEE WITHOUT CARDIOVERSION N/A 11/22/2015   Procedure: TRANSESOPHAGEAL ECHOCARDIOGRAM (TEE);  Surgeon: Lewayne Bunting, MD;  Location[facility] ENDOSCOPY;  Service: Cardiovascular;  Laterality: N/A;    Current Outpatient Prescriptions  Medication Sig Dispense Refill  . ALPRAZolam (XANAX) 0.25 MG tablet Take 1 tablet (0.25 mg total) by mouth 2 (two) times daily as needed for anxiety. 30 tablet 0  . atorvastatin (LIPITOR) 20 MG tablet Take 1 tablet (20 mg total) by mouth daily at 6 PM. 30 tablet 6  . digoxin (LANOXIN) 0.125 MG tablet Take 1 tablet (0.125 mg total) by mouth daily. 30 tablet 6  . doxylamine, Sleep, (UNISOM) 25 MG tablet Take 1 tablet (25 mg total) by mouth at bedtime as needed for sleep. 30 tablet 0  . folic acid (FOLVITE) 1 MG tablet Take 1 tablet (1 mg total) by mouth daily. 30 tablet 0  . furosemide (LASIX) 20 MG tablet Take 2 tablets (40 mg total) by mouth 2 (two) times daily. 60 tablet 6  . loperamide (IMODIUM) 2 MG capsule Take 1 capsule  (2 mg total) by mouth as needed for diarrhea or loose stools. 30 capsule 0  . Multiple Vitamin (MULTIVITAMIN WITH MINERALS) TABS tablet Take 1 tablet by mouth daily.    . nicotine (NICODERM CQ - DOSED IN MG/24 HOURS) 21 mg/24hr patch Place 1 patch (21 mg total) onto the skin daily. 28 patch 0  . pantoprazole (PROTONIX) 40 MG tablet Take 1 tablet (40 mg total) by mouth daily. 30 tablet 11  . rivaroxaban (XARELTO) 20 MG TABS tablet Take 1 tablet (20 mg total) by mouth daily with supper. 30 tablet 11  . sacubitril-valsartan (ENTRESTO) 24-26 MG Take 1 tablet by  mouth 2 (two) times daily. 60 tablet 6  . spironolactone (ALDACTONE) 25 MG tablet Take 1 tablet (25 mg total) by mouth daily. 30 tablet 6  . thiamine 100 MG tablet Take 1 tablet (100 mg total) by mouth daily. 30 tablet 0  . zolpidem (AMBIEN) 5 MG tablet Take 1 tablet (5 mg total) by mouth at bedtime as needed for sleep. 14 tablet 0   No current facility-administered medications for this visit.     Allergies as of 12/25/2015  . (No Known Allergies)    Vitals: BP 90/62 (BP Location: Left Arm, Patient Position: Sitting, Cuff Size: Small)   Pulse 60   Ht 5\' 10"  (1.778 m)   Wt 187 lb (84.8 kg)   BMI 26.83 kg/m  Last Weight:  Wt Readings from Last 1 Encounters:  12/25/15 187 lb (84.8 kg)   ZOX:WRUE mass index is 26.83 kg/m.     Last Height:   Ht Readings from Last 1 Encounters:  12/25/15 5\' 10"  (1.778 m)    Physical exam:  General: The patient is awake, alert and appears not in acute distress. The patient is well groomed. Head: Normocephalic, atraumatic. Neck is supple. Mallampati 2 with an elongated uvula. ,  neck circumference:17.25 . Nasal airflow patent. Retrognathia is seen.  Cardiovascular:  Regular rate and rhythm, without  murmurs or carotid bruit, and without distended neck veins. Respiratory: Lungs are clear to auscultation. Skin:  Without evidence of edema, or rash Trunk: BMI is . The patient's posture is erect     Neurologic exam : The patient is awake and alert, oriented to place and time.   Memory subjective  described as intact.   Attention span & concentration ability appears normal.  Speech is fluent,  without dysarthria, mild dysphonia and not longer  aphasia.  Mood and affect are appropriate.  Cranial nerves: Pupils are equal and briskly reactive to light. Funduscopic exam without  evidence of pallor or edema. Extraocular movements  in vertical and horizontal planes intact and without nystagmus. Visual fields by finger perimetry are intact. Hearing to finger rub intact.   Facial sensation intact to fine touch.  Facial motor strength is symmetric and tongue and uvula move midline. Shoulder shrug was symmetrical.   Motor exam:   Normal tone, muscle bulk and symmetric strength in all extremities.  Sensory:  Fine touch, pinprick and vibration were tested in all extremities. Proprioception tested in the upper extremities was normal.  Coordination: Rapid alternating movements in the fingers/hands was normal. Finger-to-nose maneuver normal without evidence of ataxia, dysmetria or tremor.  Gait and station: Patient walks without assistive device and is able unassisted to climb up to the exam table. Strength within normal limits.  Stance is stable and normal. Deep tendon reflexes: in the  upper and lower extremities are symmetric and intact. Babinski maneuver response is downgoing.  The patient was advised of the nature of the diagnosed sleep disorder , the treatment options and risks for general a health and wellness arising from not treating the condition.  I spent more than  45  minutes of face to face time with the patient. Greater than 50% of time was spent in counseling and coordination of care. We have discussed the diagnosis and differential and I answered the patient's questions.     Assessment:  After physical and neurologic examination, review of laboratory studies,  Personal review of  imaging studies, reports of other /same  Imaging studies ,  Results of polysomnography/ neurophysiology testing  and pre-existing records as far as provided in visit., my assessment is   1) [place] Tran has a strong family history of sleep apnea and many of his paternal family members are treated with CPAP. He only recently fell ill with a viral endocarditis which significantly reduced his ejection fraction, hopefully temporarily. In response he developed congestive heart failure, had to be anticoagulated, and diuresed. Due to the limited ejection fraction and embolic stroke in the left temporal lobe was diagnosed in June 2017. The fluid accumulation around his cardiac structure and lung has been reduced under diuresis and he feels much easier breathing, is no longer lightheaded.  His hospitalist as well as his primary care physician, Dr. Gerlene Burdock to select, suggested a sleep study and I would like to invite him for an attended sleep split-night polysomnography, the need details of oxygen saturation, any sign of hypercapnia, and if the patient does not have sleep apnea I will instruct the technologist to titrate him to oxygen should he have hypoxemia. Hypoxemia had been witnessed in the hospital during his cardiac monitoring.he was placed on oxygen until diuresis.   2) continue anticoagulant   Plan:  Treatment plan and additional workup :   Rv after SPLIT, Co2 and oxygen titration (if he qualifies )      Melvyn Novas MD  12/25/2015   CC: Gaspar Garbe, Md [address] [place], [place] [address]

## 2015-12-31 ENCOUNTER — Encounter: Payer: Self-pay | Admitting: Licensed Clinical Social Worker

## 2015-12-31 ENCOUNTER — Ambulatory Visit (HOSPITAL_COMMUNITY)
Admit: 2015-12-31 | Discharge: 2015-12-31 | Disposition: A | Discharge status: Home / Self Care | Payer: BLUE CROSS/BLUE SHIELD | Source: Ambulatory Visit | Attending: Internal Medicine | Admitting: Internal Medicine

## 2015-12-31 ENCOUNTER — Encounter (HOSPITAL_COMMUNITY): Payer: Self-pay

## 2015-12-31 VITALS — BP 91/60 | HR 95 | Wt 191.0 lb

## 2015-12-31 DIAGNOSIS — Z79899 Other long term (current) drug therapy: Secondary | ICD-10-CM | POA: Diagnosis not present

## 2015-12-31 DIAGNOSIS — E785 Hyperlipidemia, unspecified: Secondary | ICD-10-CM | POA: Diagnosis not present

## 2015-12-31 DIAGNOSIS — Z452 Encounter for adjustment and management of vascular access device: Secondary | ICD-10-CM | POA: Insufficient documentation

## 2015-12-31 DIAGNOSIS — Z8673 Personal history of transient ischemic attack (TIA), and cerebral infarction without residual deficits: Secondary | ICD-10-CM | POA: Insufficient documentation

## 2015-12-31 DIAGNOSIS — I5022 Chronic systolic (congestive) heart failure: Secondary | ICD-10-CM | POA: Diagnosis not present

## 2015-12-31 DIAGNOSIS — F909 Attention-deficit hyperactivity disorder, unspecified type: Secondary | ICD-10-CM

## 2015-12-31 DIAGNOSIS — Z7901 Long term (current) use of anticoagulants: Secondary | ICD-10-CM | POA: Insufficient documentation

## 2015-12-31 DIAGNOSIS — Z87891 Personal history of nicotine dependence: Secondary | ICD-10-CM | POA: Diagnosis not present

## 2015-12-31 DIAGNOSIS — F419 Anxiety disorder, unspecified: Secondary | ICD-10-CM | POA: Diagnosis not present

## 2015-12-31 DIAGNOSIS — K219 Gastro-esophageal reflux disease without esophagitis: Secondary | ICD-10-CM | POA: Insufficient documentation

## 2015-12-31 DIAGNOSIS — I5041 Acute combined systolic (congestive) and diastolic (congestive) heart failure: Secondary | ICD-10-CM | POA: Diagnosis present

## 2015-12-31 DIAGNOSIS — Z8249 Family history of ischemic heart disease and other diseases of the circulatory system: Secondary | ICD-10-CM | POA: Insufficient documentation

## 2015-12-31 DIAGNOSIS — I63032 Cerebral infarction due to thrombosis of left carotid artery: Secondary | ICD-10-CM

## 2015-12-31 DIAGNOSIS — I110 Hypertensive heart disease with heart failure: Secondary | ICD-10-CM | POA: Insufficient documentation

## 2015-12-31 DIAGNOSIS — I420 Dilated cardiomyopathy: Secondary | ICD-10-CM

## 2015-12-31 LAB — CBC
HEMATOCRIT: 51.6 % (ref 39.0–52.0)
HEMOGLOBIN: 17.1 g/dL — AB (ref 13.0–17.0)
MCH: 30 pg (ref 26.0–34.0)
MCHC: 33.1 g/dL (ref 30.0–36.0)
MCV: 90.5 fL (ref 78.0–100.0)
Platelets: 252 10*3/uL (ref 150–400)
RBC: 5.7 MIL/uL (ref 4.22–5.81)
RDW: 12.9 % (ref 11.5–15.5)
WBC: 9.5 10*3/uL (ref 4.0–10.5)

## 2015-12-31 LAB — BASIC METABOLIC PANEL
Anion gap: 7 (ref 5–15)
BUN: 15 mg/dL (ref 6–20)
CHLORIDE: 108 mmol/L (ref 101–111)
CO2: 24 mmol/L (ref 22–32)
CREATININE: 0.87 mg/dL (ref 0.61–1.24)
Calcium: 9.2 mg/dL (ref 8.9–10.3)
GFR calc non Af Amer: >60 mL/min (ref >60)
Glucose, Bld: 90 mg/dL (ref 65–99)
Potassium: 4.2 mmol/L (ref 3.5–5.1)
Sodium: 139 mmol/L (ref 135–145)

## 2015-12-31 LAB — CARBOXYHEMOGLOBIN
CARBOXYHEMOGLOBIN: 1.4 % (ref 0.5–1.5)
METHEMOGLOBIN: 0.7 % (ref 0.0–1.5)
O2 Saturation: 72.4 %
Total hemoglobin: 17.7 g/dL (ref 13.5–18.0)

## 2015-12-31 LAB — BRAIN NATRIURETIC PEPTIDE: B Natriuretic Peptide: 1021.7 pg/mL — ABNORMAL HIGH (ref 0.0–100.0)

## 2015-12-31 MED ORDER — FOLIC ACID 1 MG PO TABS: 1.0000 mg | ORAL_TABLET | Freq: Every day | ORAL | 6 refills | Status: DC

## 2015-12-31 NOTE — Patient Instructions (Signed)
Your physician recommends that you schedule a follow-up appointment in: 2-3 weeks  

## 2015-12-31 NOTE — Progress Notes (Signed)
CSW referred to assist with financial resources. Patient recently diagnosed with Heart Failure and unable to work. Patient was previously working in [employer] and has Engineer, site through [employer]. Patient escorted today to the clinic with his mother and father who are very involved and supportive. Patient reports he may not be able to return to work for sometime and wondering what options are available for income. Patient has no savings and no paid leave from his employer. Patient does not anticipate he would be permanently disabled therefore not eligible for Social Security. Patient will continue to receive his BC/BS benefits although has no income. CSW suggested patient return to employer to inquire about short term disability or unemployment benefits. Patient and parents verbalize understanding of follow up.  CSW encouraged patient to return call with any further concerns. Lasandra Beech, LCSW [phone]

## 2015-12-31 NOTE — Progress Notes (Signed)
Advanced Heart Failure Clinic Note    Primary Care: Dr Wylene Simmer Primary Cardiologist: Eden Emms Primary HF: Dr. 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 Drinks 4-5 beers a week.   In June 2017, cardioembolic stroke. Echocardiogram obtained on 11/21/2015 however showed EF 20-25%, severe dilatation of the left ventricle, mild Austin, moderate left atrial enlargement, moderate RV enlargement with reduced RV function. Cardiology was consulted for TEE in the setting of stroke and also LV dysfunction. TEE obtained on 11/22/2015 showed severe LV dysfunction with EF 15%, severe RV dysfunction.  Admitted 12/17/15 with N/V/diarrhea/presyncope/dyspnea. CT angio - negative for PE. CXR no acute findings. C diff negative. HF team consulted with complaints of fatigue and mild nausea.    Echo 12/17/15 showed EF dropped from 20-25% to 10% over 1 month. 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   Initial Coox 31% consistent with severe cardiogenic shock. Started on milrinone 0.375 mcg with coox up to 74%. BB stopped. Brisk diuresis with IV lasix after addition of inotropes. States it was the "best he felt in weeks".   FOBT +. Hgb remained stable throughout admission. No overt bleeding.   cMRI 12/19/15 1. Severe LV dilation with EF 16%, diffuse hypokinesis. 2. Mild RV dilation, moderately decreased RV systolic function. 3. LGE in the basal to mid inferoseptal RV insertion site. This is a nonspecific finding and often suggests volume overload/LV strain  Milrinone weaned and medications adjusted as tolerated. Losartan switched to Entresto.  K stopped with K > 4.5 and Entresto.  He was successfully weaned off milrinone completely and remained stable symptomatically for discharge.   Overall he diuresed 12.2 L and down 15 lbs from admission. He will be discharged home  today in stable condition.  We will maintain PICC at this time with significant cardiac output. Plan to check Coox at clinic next week and remove PICC if stable.   He presents today for post hospital follow up.  SBP running ~105 at home. Has been feeling great since discharge. Denies any symptoms at all. Denies lightheadedness or dizziness. Denies SOB. Walking dog 2-3 times a day with SOB. No orthopnea or DOE, including on stairs. No CP. Denies palpitations. Denies bleeding on Xarelto. Weight at home stable at 186 (stable from discharge.   Past Medical History:  Diagnosis Date  . ADHD (attention deficit hyperactivity disorder)    on adderall since 2004  . Chronic systolic (congestive) heart failure (HCC)   . GERD (gastroesophageal reflux disease)   . HLD (hyperlipidemia)   . Hypertension   . Stroke (HCC)   . Tobacco abuse     Current Outpatient Prescriptions  Medication Sig Dispense Refill  . ALPRAZolam (XANAX) 0.25 MG tablet Take 1 tablet (0.25 mg total) by mouth 2 (two) times daily as needed for anxiety. 30 tablet 0  . atorvastatin (LIPITOR) 20 MG tablet Take 1 tablet (20 mg total) by mouth daily at 6 PM. 30 tablet 6  . digoxin (LANOXIN) 0.125 MG tablet Take 1 tablet (0.125 mg total) by mouth daily. 30 tablet 6  . doxylamine, Sleep, (UNISOM) 25 MG tablet Take 1 tablet (25 mg total) by mouth at bedtime as needed for sleep. 30 tablet 0  . folic acid (FOLVITE) 1 MG tablet Take 1 tablet (1 mg total) by mouth daily. 30 tablet 6  . furosemide (LASIX) 20 MG  tablet Take 2 tablets (40 mg total) by mouth 2 (two) times daily. 60 tablet 6  . loperamide (IMODIUM) 2 MG capsule Take 1 capsule (2 mg total) by mouth as needed for diarrhea or loose stools. 30 capsule 0  . Multiple Vitamin (MULTIVITAMIN WITH MINERALS) TABS tablet Take 1 tablet by mouth daily.    . nicotine (NICODERM CQ - DOSED IN MG/24 HOURS) 21 mg/24hr patch Place 1 patch (21 mg total) onto the skin daily. 28 patch 0  . pantoprazole  (PROTONIX) 40 MG tablet Take 1 tablet (40 mg total) by mouth daily. 30 tablet 11  . rivaroxaban (XARELTO) 20 MG TABS tablet Take 1 tablet (20 mg total) by mouth daily with supper. 30 tablet 11  . sacubitril-valsartan (ENTRESTO) 24-26 MG Take 1 tablet by mouth 2 (two) times daily. 60 tablet 6  . spironolactone (ALDACTONE) 25 MG tablet Take 1 tablet (25 mg total) by mouth daily. 30 tablet 6  . thiamine 100 MG tablet Take 1 tablet (100 mg total) by mouth daily. 30 tablet 0  . zolpidem (AMBIEN) 5 MG tablet Take 1 tablet (5 mg total) by mouth at bedtime as needed for sleep. 14 tablet 0   No current facility-administered medications for this encounter.     No Known Allergies    Social History   Social History  . Marital status: Single    Spouse name: N/A  . Number of children: N/A  . Years of education: N/A   Occupational History  . Not on file.   Social History Main Topics  . Smoking status: Current Some Day Smoker    Types: Cigarettes  . Smokeless tobacco: Never Used  . Alcohol use 7.2 oz/week    12 Cans of beer per week     Comment: drink 4-5 beers a night, more on the weekend  . Drug use: No  . Sexual activity: Not on file   Other Topics Concern  . Not on file   Social History Narrative  . No narrative on file      Family History  Problem Relation Age of Onset  . Hypertension Mother   . Hypertension Father   . Cancer Maternal Grandmother     Vitals:   12/31/15 1402  BP: 91/60  Pulse: 95  SpO2: 95%  Weight: 191 lb (86.6 kg)   Wt Readings from Last 3 Encounters:  12/31/15 191 lb (86.6 kg)  12/25/15 187 lb (84.8 kg)  12/23/15 190 lb (86.2 kg)    PHYSICAL EXAM: General:  Well appearing. No resp difficulty. Lying flat in bed  HEENT: normal Neck: supple. JVP flat. Carotids 2+ bilat; no bruits. No thyromegaly or nodule noted.  Cor: PMI nondisplaced. Regular, slightly tachy. No murmurs or rubs noted. No s3 Lungs: CTAB, normal effort. Abdomen: soft, NT, ND, no  HSM. No bruits or masses. +BS  Extremities: no cyanosis, clubbing, rash, No peripheral edema. RUE PICC Neuro: alert & orientedx3, cranial nerves grossly intact. moves all 4 extremities w/o difficulty. Affect pleasant   ASSESSMENT & PLAN:  1. Chronic Systolic HF - EF 16% moderately dilated/ ? Viral CM 2. H/o cardioembolic stroke 10/2015 on Xarelto 3. ADHD/Anxiety 4. Former Smoker  Austin Nicholson looks great today.  NYHA Class II.  Remains slightly tachycardic and pressure on softer side. Will not push meds today with recent low output. Continue Entresto 24/26, Spiro 25 mg daily, and Digoxin 0.125 mg daily. Consider low dose beta blocker at next appointment.  Continue lasix 20 mg  BID as he has been doing. Volume status stable on exam.   Coox checked today in clinic 72%. PICC line removed at bedside by Dr. Gala Romney without complications.   Will plan for bedside echo at visit in 2-3 weeks to look at short term improvement in EF.     Will plan formal echo for 3-6 months with continued med titration.   BMET and CBC today stable. BNP improved from in hospital.   Austin Freer, PA-C  Patient seen and examined with Otilio Saber, PA-C. We discussed all aspects of the encounter. I agree with the assessment and plan as stated above.   He is improving but still somewhat tachycardic. Volume status ok. Co-ox looks good off milrinone so I pulled PICC personally in clinic. We will continue to follow closely. Discussed with him and his aprents.   Bensimhon, Daniel,MD 10:45 PM

## 2016-01-01 ENCOUNTER — Ambulatory Visit: Payer: BLUE CROSS/BLUE SHIELD | Admitting: Cardiovascular Disease

## 2016-01-05 ENCOUNTER — Ambulatory Visit (INDEPENDENT_AMBULATORY_CARE_PROVIDER_SITE_OTHER): Payer: BLUE CROSS/BLUE SHIELD | Admitting: Neurology

## 2016-01-05 DIAGNOSIS — B3321 Viral endocarditis: Secondary | ICD-10-CM

## 2016-01-05 DIAGNOSIS — G4733 Obstructive sleep apnea (adult) (pediatric): Secondary | ICD-10-CM | POA: Diagnosis not present

## 2016-01-05 DIAGNOSIS — I5041 Acute combined systolic (congestive) and diastolic (congestive) heart failure: Secondary | ICD-10-CM

## 2016-01-05 DIAGNOSIS — R0683 Snoring: Secondary | ICD-10-CM

## 2016-01-07 ENCOUNTER — Telehealth: Payer: Self-pay | Admitting: Neurology

## 2016-01-07 NOTE — Telephone Encounter (Signed)
Pt just had his sleep study on 01/05/2016. It usually takes 10-14 days to receive results.  I called pt to advise him of this. No answer, left a message asking him to call me back. If pt calls back, please advise him of this information.

## 2016-01-07 NOTE — Telephone Encounter (Signed)
Patient called to request sleep study results.

## 2016-01-13 ENCOUNTER — Telehealth: Payer: Self-pay

## 2016-01-13 DIAGNOSIS — G4731 Primary central sleep apnea: Secondary | ICD-10-CM

## 2016-01-13 NOTE — Telephone Encounter (Signed)
I spoke to pt. I advised him that his sleep study revealed complex sleep apnea and that Dr. Vickey Huger recommended starting a CPAP. Pt is agreeable to starting a cpap. I advised him that I would send the order to [facility] and they would call him within a week. I advised pt to avoid caffeine containing beverages and chocolate. A follow up appt was made for 03/23/2016. Pt verbalized understanding of results. A copy was sent to pt's PCP, Dr. Wylene Simmer. Pt had no questions at this time but was encouraged to call back if questions arise.

## 2016-01-14 ENCOUNTER — Telehealth (HOSPITAL_COMMUNITY): Payer: Self-pay | Admitting: Pharmacist

## 2016-01-14 ENCOUNTER — Ambulatory Visit (HOSPITAL_COMMUNITY)
Admission: RE | Admit: 2016-01-14 | Discharge: 2016-01-14 | Disposition: A | Discharge status: Home / Self Care | Payer: BLUE CROSS/BLUE SHIELD | Source: Ambulatory Visit | Attending: Internal Medicine | Admitting: Internal Medicine

## 2016-01-14 VITALS — BP 102/68 | HR 81 | Wt 192.4 lb

## 2016-01-14 DIAGNOSIS — Z8673 Personal history of transient ischemic attack (TIA), and cerebral infarction without residual deficits: Secondary | ICD-10-CM | POA: Diagnosis not present

## 2016-01-14 DIAGNOSIS — F419 Anxiety disorder, unspecified: Secondary | ICD-10-CM | POA: Insufficient documentation

## 2016-01-14 DIAGNOSIS — I110 Hypertensive heart disease with heart failure: Secondary | ICD-10-CM | POA: Insufficient documentation

## 2016-01-14 DIAGNOSIS — Z79899 Other long term (current) drug therapy: Secondary | ICD-10-CM | POA: Diagnosis not present

## 2016-01-14 DIAGNOSIS — E785 Hyperlipidemia, unspecified: Secondary | ICD-10-CM | POA: Insufficient documentation

## 2016-01-14 DIAGNOSIS — F909 Attention-deficit hyperactivity disorder, unspecified type: Secondary | ICD-10-CM | POA: Insufficient documentation

## 2016-01-14 DIAGNOSIS — I5022 Chronic systolic (congestive) heart failure: Secondary | ICD-10-CM | POA: Diagnosis not present

## 2016-01-14 DIAGNOSIS — Z87891 Personal history of nicotine dependence: Secondary | ICD-10-CM | POA: Diagnosis not present

## 2016-01-14 DIAGNOSIS — Z7901 Long term (current) use of anticoagulants: Secondary | ICD-10-CM | POA: Diagnosis not present

## 2016-01-14 DIAGNOSIS — Z8249 Family history of ischemic heart disease and other diseases of the circulatory system: Secondary | ICD-10-CM | POA: Diagnosis not present

## 2016-01-14 DIAGNOSIS — I63032 Cerebral infarction due to thrombosis of left carotid artery: Secondary | ICD-10-CM | POA: Diagnosis not present

## 2016-01-14 DIAGNOSIS — G4733 Obstructive sleep apnea (adult) (pediatric): Secondary | ICD-10-CM | POA: Insufficient documentation

## 2016-01-14 DIAGNOSIS — K219 Gastro-esophageal reflux disease without esophagitis: Secondary | ICD-10-CM | POA: Diagnosis not present

## 2016-01-14 MED ORDER — CARVEDILOL 3.125 MG PO TABS: 3.1250 mg | ORAL_TABLET | Freq: Two times a day (BID) | ORAL | 3 refills | Status: DC

## 2016-01-14 NOTE — Patient Instructions (Signed)
START Carvedilol (Coreg) 3.125 mg tablet once every evening for 1 week, then increase dose to twice daily.  Follow up 4 weeks with Otilio Saber PA-C.  Do the following things EVERYDAY: 1) Weigh yourself in the morning before breakfast. Write it down and keep it in a log. 2) Take your medicines as prescribed 3) Eat low salt foods-Limit salt (sodium) to 2000 mg per day.  4) Stay as active as you can everyday 5) Limit all fluids for the day to less than 2 liters

## 2016-01-14 NOTE — Progress Notes (Signed)
Advanced Heart Failure Medication Review by a Pharmacist  Does the patient  feel that his/her medications are working for him/her?  yes  Has the patient been experiencing any side effects to the medications prescribed?  no  Does the patient measure his/her own blood pressure or blood glucose at home?  no   Does the patient have any problems obtaining medications due to transportation or finances?   no  Understanding of regimen: good Understanding of indications: good Potential of compliance: good Patient understands to avoid NSAIDs. Patient understands to avoid decongestants.  Issues to address at subsequent visits: None   Pharmacist comments:  Austin Tran is a pleasant 30 yo M presenting with a current medication list. He reports good compliance with his regimen. He did ask about how long he would need to continue his Xarelto and per neuro notes they would like him to continue until LV function recovers. No other medication-related questions or concerns for me at this time.   Tyler Deis. Bonnye Fava, PharmD, BCPS, CPP Clinical Pharmacist Pager: [phone] Phone: [phone] 01/14/2016 2:44 PM      Time with patient: 10 minutes Preparation and documentation time: 2 minutes Total time: 12 minutes

## 2016-01-14 NOTE — Progress Notes (Signed)
Advanced Heart Failure Clinic Note    Primary Care: Dr Wylene Simmer Primary Cardiologist: Eden Emms Primary HF: Dr. 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 Drinks 4-5 beers a week.   In June 2017, cardioembolic stroke. Echocardiogram obtained on 11/21/2015 however showed EF 20-25%, severe dilatation of the left ventricle, mild MR, moderate left atrial enlargement, moderate RV enlargement with reduced RV function. Cardiology was consulted for TEE in the setting of stroke and also LV dysfunction. TEE obtained on 11/22/2015 showed severe LV dysfunction with EF 15%, severe RV dysfunction.  Admitted 12/17/15 with N/V/diarrhea/presyncope/dyspnea. CT angio - negative for PE. CXR no acute findings. C diff negative. HF team consulted with complaints of fatigue and mild nausea.    Echo 12/17/15 showed EF dropped from 20-25% to 10% over 1 month. 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   Initial Coox 31% consistent with severe cardiogenic shock. Started on milrinone 0.375 mcg with coox up to 74%. BB stopped. Brisk diuresis with IV lasix after addition of inotropes. States it was the "best he felt in weeks".   FOBT +. Hgb remained stable throughout admission. No overt bleeding.   cMRI 12/19/15 1. Severe LV dilation with EF 16%, diffuse hypokinesis. 2. Mild RV dilation, moderately decreased RV systolic function. 3. LGE in the basal to mid inferoseptal RV insertion site. This is a nonspecific finding and often suggests volume overload/LV strain  Milrinone weaned and medications adjusted as tolerated. Losartan switched to Entresto.  K stopped with K > 4.5 and Entresto.  He was successfully weaned off milrinone completely and remained stable symptomatically for discharge. Overall he diuresed 12.2 L and down 15 lbs from admission.   He presents today for regular  follow up. Has felt great since PICC line removed at last visit.  Played golf and has been more active without any problems.   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  SBP ~95-105 at home. No lightheadedness or dizziness. Denies CP, orthopnea, or palpitations. No BRBPR or melena on Xarelto. Weight at home 186 -> 188.   Past Medical History:  Diagnosis Date  . ADHD (attention deficit hyperactivity disorder)    on adderall since 2004  . Chronic systolic (congestive) heart failure (HCC)   . GERD (gastroesophageal reflux disease)   . HLD (hyperlipidemia)   . Hypertension   . Stroke (HCC)   . Tobacco abuse     Current Outpatient Prescriptions  Medication Sig Dispense Refill  . acetaminophen (TYLENOL) 325 MG tablet Take 650 mg by mouth every 6 (six) hours as needed for mild pain or headache.    . ALPRAZolam (XANAX) 0.25 MG tablet Take 1 tablet (0.25 mg total) by mouth 2 (two) times daily as needed for anxiety. 30 tablet 0  . atorvastatin (LIPITOR) 20 MG tablet Take 1 tablet (20 mg total) by mouth daily at 6 PM. 30 tablet 6  . digoxin (LANOXIN) 0.125 MG tablet Take 1 tablet (0.125 mg total) by mouth daily. 30 tablet 6  . folic acid (FOLVITE) 1 MG tablet Take 1 tablet (1 mg total) by mouth daily. 30 tablet 6  . furosemide (LASIX) 20 MG tablet Take 20 mg by mouth 2 (two) times daily.    . Multiple Vitamin (MULTIVITAMIN WITH MINERALS) TABS tablet  Take 1 tablet by mouth daily.    . pantoprazole (PROTONIX) 40 MG tablet Take 1 tablet (40 mg total) by mouth daily. 30 tablet 11  . rivaroxaban (XARELTO) 20 MG TABS tablet Take 1 tablet (20 mg total) by mouth daily with supper. 30 tablet 11  . sacubitril-valsartan (ENTRESTO) 24-26 MG Take 1 tablet by mouth 2 (two) times daily. 60 tablet 6  . spironolactone (ALDACTONE) 25 MG tablet Take 1 tablet (25 mg total) by mouth daily. 30 tablet 6  . thiamine 100 MG tablet Take 1 tablet (100 mg total) by  mouth daily. 30 tablet 0  . zolpidem (AMBIEN) 5 MG tablet Take 1 tablet (5 mg total) by mouth at bedtime as needed for sleep. 14 tablet 0   No current facility-administered medications for this encounter.     No Known Allergies    Social History   Social History  . Marital status: Single    Spouse name: N/A  . Number of children: N/A  . Years of education: N/A   Occupational History  . Not on file.   Social History Main Topics  . Smoking status: Current Some Day Smoker    Types: Cigarettes  . Smokeless tobacco: Never Used  . Alcohol use 7.2 oz/week    12 Cans of beer per week     Comment: drink 4-5 beers a night, more on the weekend  . Drug use: No  . Sexual activity: Not on file   Other Topics Concern  . Not on file   Social History Narrative  . No narrative on file      Family History  Problem Relation Age of Onset  . Hypertension Mother   . Hypertension Father   . Cancer Maternal Grandmother     Vitals:   01/14/16 1405  BP: 102/68  Pulse: 81  SpO2: 97%  Weight: 192 lb 6.4 oz (87.3 kg)   Wt Readings from Last 3 Encounters:  01/14/16 192 lb 6.4 oz (87.3 kg)  12/31/15 191 lb (86.6 kg)  12/25/15 187 lb (84.8 kg)    PHYSICAL EXAM: General:  Well appearing. No resp difficulty. Lying flat in bed  HEENT: normal Neck: supple. JVP flat. Carotids 2+ bilat; no bruits. No thyromegaly or nodule noted.  Cor: PMI nondisplaced. Regular, slightly tachy. No murmurs or rubs noted. No s3 Lungs: CTAB, normal effort. Abdomen: soft, NT, ND, no HSM. No bruits or masses. +BS  Extremities: no cyanosis, clubbing, rash, No peripheral edema. RUE PICC Neuro: alert & orientedx3, cranial nerves grossly intact. moves all 4 extremities w/o difficulty. Affect pleasant   ASSESSMENT & PLAN:  1. Chronic Systolic HF - EF 75% moderately dilated/ ? Viral CM 2. H/o cardioembolic stroke 10/2015 on Xarelto 3. ADHD/Anxiety 4. Former Smoker 5. OSA - AHI 49.4    Looks great. NYHA class  1.   HR also improved from last visit. Now in 41s. BP relatively soft in upper 90s at home.   Continue Entresto 24/26, Spiro 25 mg daily, and Digoxin 0.125 mg daily.   Will try on low dose coreg 3.125 mg BID.  Should start with qhs dosing and proceed to BID dosing as tolerated.   Continue lasix 20 mg BID  Volume status stable on exam.   Bedside echo today shows EF 20-25%, so mild improvement with no real predictive value at this early stage.  Will plan for Echo late October - Early November.   Sleep Study + for OSA with AHI 49.4. CPAP titration  upcoming.   We recommend that he go on a non-stimulant ADHD medicine.    Graciella FreerMichael Andrew Tillery, PA-C 01/14/16    Patient seen and examined with Otilio SaberAndy Tillery, PA-C. We discussed all aspects of the encounter. I agree with the assessment and plan as stated above.   Functionally much improved but I performed bedside echo personally in clinic and EF still 20-25%. Volume status ok. Will start low-dose carvedilol. Agree with CPAP. Avoid ADD stimulants for now.   Wrenly Lauritsen,MD 12:14 AM

## 2016-01-14 NOTE — Telephone Encounter (Signed)
Entresto 24-26 mg BID PA approved by [employer] through 05/24/38.   Tyler Deis. Bonnye Fava, PharmD, BCPS, CPP Clinical Pharmacist Pager: [phone] Phone: [phone] 01/14/2016 1:43 PM

## 2016-01-17 ENCOUNTER — Encounter: Payer: Self-pay | Admitting: Family Medicine

## 2016-02-03 ENCOUNTER — Encounter: Payer: Self-pay | Admitting: Neurology

## 2016-02-03 ENCOUNTER — Ambulatory Visit (INDEPENDENT_AMBULATORY_CARE_PROVIDER_SITE_OTHER): Payer: BLUE CROSS/BLUE SHIELD | Admitting: Neurology

## 2016-02-03 VITALS — BP 95/67 | HR 72 | Ht 70.0 in | Wt 195.8 lb

## 2016-02-03 DIAGNOSIS — I63412 Cerebral infarction due to embolism of left middle cerebral artery: Secondary | ICD-10-CM | POA: Diagnosis not present

## 2016-02-03 NOTE — Patient Instructions (Addendum)
I had a long d/w patient about his recent embolic stroke, risk for recurrent stroke/TIAs, personally independently reviewed imaging studies and stroke evaluation results and answered questions.Continue Xarelto daily  for secondary stroke prevention and maintain strict control of hypertension with blood pressure goal below 130/90, diabetes with hemoglobin A1c goal below 6.5% and lipids with LDL cholesterol goal below 70 mg/dL. I also advised the patient to eat a healthy diet with plenty of whole grains, cereals, fruits and vegetables, exercise regularly and maintain ideal body weight he was also advised to use his CPAP regularly and to quit smoking and drinking alcohol..he was also advised to watch his fluid and salt intake as per cardiology recommendations. Follow-up with Dr. Lucia Bitter  at the heart failure clinic. Patient may be able to change from Xarelto to aspirin once his cardiac ejection fraction improves. Followup in the future with my nurse practitioner in 6 months or call earlier if necessary.  Stroke Prevention Some medical conditions and behaviors are associated with an increased chance of having a stroke. You may prevent a stroke by making healthy choices and managing medical conditions. HOW CAN I REDUCE MY RISK OF HAVING A STROKE?   Stay physically active. Get at least 30 minutes of activity on most or all days.  Do not smoke. It may also be helpful to avoid exposure to secondhand smoke.  Limit alcohol use. Moderate alcohol use is considered to be:  No more than 2 drinks per day for men.  No more than 1 drink per day for nonpregnant women.  Eat healthy foods. This involves:  Eating 5 or more servings of fruits and vegetables a day.  Making dietary changes that address high blood pressure (hypertension), high cholesterol, diabetes, or obesity.  Manage your cholesterol levels.  Making food choices that are high in fiber and low in saturated fat, trans fat, and cholesterol may  control cholesterol levels.  Take any prescribed medicines to control cholesterol as directed by your health care provider.  Manage your diabetes.  Controlling your carbohydrate and sugar intake is recommended to manage diabetes.  Take any prescribed medicines to control diabetes as directed by your health care provider.  Control your hypertension.  Making food choices that are low in salt (sodium), saturated fat, trans fat, and cholesterol is recommended to manage hypertension.  Ask your health care provider if you need treatment to lower your blood pressure. Take any prescribed medicines to control hypertension as directed by your health care provider.  If you are 109-39 years of age, have your blood pressure checked every 3-5 years. If you are 1 years of age or older, have your blood pressure checked every year.  Maintain a healthy weight.  Reducing calorie intake and making food choices that are low in sodium, saturated fat, trans fat, and cholesterol are recommended to manage weight.  Stop drug abuse.  Avoid taking birth control pills.  Talk to your health care provider about the risks of taking birth control pills if you are over 54 years old, smoke, get migraines, or have ever had a blood clot.  Get evaluated for sleep disorders (sleep apnea).  Talk to your health care provider about getting a sleep evaluation if you snore a lot or have excessive sleepiness.  Take medicines only as directed by your health care provider.  For some people, aspirin or blood thinners (anticoagulants) are helpful in reducing the risk of forming abnormal blood clots that can lead to stroke. If you have the irregular heart  rhythm of atrial fibrillation, you should be on a blood thinner unless there is a good reason you cannot take them.  Understand all your medicine instructions.  Make sure that other conditions (such as anemia or atherosclerosis) are addressed. SEEK IMMEDIATE MEDICAL CARE IF:    You have sudden weakness or numbness of the face, arm, or leg, especially on one side of the body.  Your face or eyelid droops to one side.  You have sudden confusion.  You have trouble speaking (aphasia) or understanding.  You have sudden trouble seeing in one or both eyes.  You have sudden trouble walking.  You have dizziness.  You have a loss of balance or coordination.  You have a sudden, severe headache with no known cause.  You have new chest pain or an irregular heartbeat. Any of these symptoms may represent a serious problem that is an emergency. Do not wait to see if the symptoms will go away. Get medical help at once. Call your local emergency services (911 in [place] Do not drive yourself to the hospital.   This information is not intended to replace advice given to you by your health care provider. Make sure you discuss any questions you have with your health care provider.   Document Released: 06/18/2004 Document Revised: 06/01/2014 Document Reviewed: 11/11/2012 Elsevier Interactive Patient Education [employer]2016 [employer]

## 2016-02-03 NOTE — Progress Notes (Signed)
[facility] [address] [place]. [place] [address] [phone]       OFFICE FOLLOW-UP NOTE  [name] Date of Birth:  [birth year] Medical Record Number:  [id number]   HPI: Mr Tran is a 28 year Caucasian male seen today for first office follow-up visit following hospital admission for stroke in June 2017. Austin Tran is an 30 y.o. male who presented to the Emergency Department  On 11/21/15 complaining of a gradually improving episode of moderate expressive aphasia (difficulty expressing words and forming sentences) that was first noticed at 12:30 pm  11/20/2015 ([facility]) after awakening from a nap. There are no specific mitigating or exacerbating factors. Per chart he reported associated, vaguely described, bilateral visual disturbance with trouble reading that lasted for 15 minutes. He forced himself to vomit once thinking he might have been poisoned. Currently he is feeling almost back to baseline. Girlfriend did not note any seizure activity. No known history of clotting deficiency in family. Patient was not administered IV t-PA secondary to minimal symptoms. He was admitted  for further evaluation and treatment. CT scan of the head was unremarkable but MRI scan showed a small posterior temporal left MCA branch infarct. Carotid ultrasound showed no significant extracranial stenosis. Transthoracic echo showed poor ejection fraction of 20-25%. Transesophageal echocardiogram showed no cardiac source of embolism but globally diffused LV function with low ejection fraction. LDL cholesterol was 128 mg percent. Albumin A1c was 5.3. Hypercoagulable labs were negative. CT angiogram of the brain and neck both did not reveal significant large vessel intracranial stenosis or occlusion. 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 He is seen by cardiology in consultation and subsequently had outpatient cardiac MRI. He has had outpatient follow-up  with Dr. Teena Dunk on in the congestive heart failure clinic. He was initially started on eliquis but subsequently was seen in the emergency room on July 25 as well as was admitted on July 31 with exacerbation of CHF. He required diuresis and adjustment of his cardiac medications. He states he is doing much better now. He is more energy in fact he has been playing golf. He has been careful in watching his fluid and salt intake. He is presently on Xarelto which is tolerating well without muscle aches or pain. States his speech and word finding difficulties have completely recovered. He has no neurological complaints or deficits and the present time. He has an upcoming follow-up visit at the CHF clinic.  ROS:   14 system review of systems is positive for tiredness, shortness of breath, sleep talking, snoring, sleep apnea, decreased concentration and all other systems negative  PMH:  Past Medical History:  Diagnosis Date  . ADHD (attention deficit hyperactivity disorder)    on adderall since 2004  . Chronic systolic (congestive) heart failure (HCC)   . GERD (gastroesophageal reflux disease)   . HLD (hyperlipidemia)   . Hypertension   . Stroke (HCC)   . Tobacco abuse     Social History:  Social History   Social History  . Marital status: Single    Spouse name: N/A  . Number of children: N/A  . Years of education: N/A   Occupational History  . Not on file.   Social History Main Topics  . Smoking status: Former Smoker    Types: Cigarettes    Quit date: 11/23/2015  . Smokeless tobacco: Never Used  . Alcohol use 7.2 oz/week    12 Cans of beer per week  Comment: last had beer 11/23/2015  . Drug use: No  . Sexual activity: Not on file   Other Topics Concern  . Not on file   Social History Narrative  . No narrative on file    Medications:   Current Outpatient Prescriptions on File Prior to Visit  Medication Sig Dispense Refill  . acetaminophen (TYLENOL) 325 MG tablet Take 650  mg by mouth every 6 (six) hours as needed for mild pain or headache.    . ALPRAZolam (XANAX) 0.25 MG tablet Take 1 tablet (0.25 mg total) by mouth 2 (two) times daily as needed for anxiety. 30 tablet 0  . atorvastatin (LIPITOR) 20 MG tablet Take 1 tablet (20 mg total) by mouth daily at 6 PM. 30 tablet 6  . carvedilol (COREG) 3.125 MG tablet Take 1 tablet (3.125 mg total) by mouth 2 (two) times daily. 180 tablet 3  . digoxin (LANOXIN) 0.125 MG tablet Take 1 tablet (0.125 mg total) by mouth daily. 30 tablet 6  . folic acid (FOLVITE) 1 MG tablet Take 1 tablet (1 mg total) by mouth daily. 30 tablet 6  . furosemide (LASIX) 20 MG tablet Take 20 mg by mouth 2 (two) times daily.    . Multiple Vitamin (MULTIVITAMIN WITH MINERALS) TABS tablet Take 1 tablet by mouth daily.    . pantoprazole (PROTONIX) 40 MG tablet Take 1 tablet (40 mg total) by mouth daily. 30 tablet 11  . rivaroxaban (XARELTO) 20 MG TABS tablet Take 1 tablet (20 mg total) by mouth daily with supper. 30 tablet 11  . sacubitril-valsartan (ENTRESTO) 24-26 MG Take 1 tablet by mouth 2 (two) times daily. 60 tablet 6  . spironolactone (ALDACTONE) 25 MG tablet Take 1 tablet (25 mg total) by mouth daily. 30 tablet 6  . thiamine 100 MG tablet Take 1 tablet (100 mg total) by mouth daily. 30 tablet 0  . zolpidem (AMBIEN) 5 MG tablet Take 1 tablet (5 mg total) by mouth at bedtime as needed for sleep. 14 tablet 0   No current facility-administered medications on file prior to visit.     Allergies:  No Known Allergies  Physical Exam General: well developed, well nourished Young Caucasian male, seated, in no evident distress Head: head normocephalic and atraumatic.  Neck: supple with no carotid or supraclavicular bruits Cardiovascular: regular rate and rhythm, no murmurs Musculoskeletal: no deformity Skin:  no rash/petichiae Vascular:  Normal pulses all extremities Vitals:   02/03/16 1337  BP: 95/67  Pulse: 72   Neurologic Exam Mental  Status: Awake and fully alert. Oriented to place and time. Recent and remote memory intact. Attention span, concentration and fund of knowledge appropriate. Mood and affect appropriate. Speech and language normal. Animal naming 11. Able to name and repeat and comprehend quite well. Fluent speech. Cranial Nerves: Fundoscopic exam reveals sharp disc margins. Pupils equal, briskly reactive to light. Extraocular movements full without nystagmus. Visual fields full to confrontation. Hearing intact. Facial sensation intact. Face, tongue, palate moves normally and symmetrically.  Motor: Normal bulk and tone. Normal strength in all tested extremity muscles. Sensory.: intact to touch ,pinprick .position and vibratory sensation.  Coordination: Rapid alternating movements normal in all extremities. Finger-to-nose and heel-to-shin performed accurately bilaterally. Gait and Station: Arises from chair without difficulty. Stance is normal. Gait demonstrates normal stride length and balance . Able to heel, toe and tandem walk without difficulty.  Reflexes: 1+ and symmetric. Toes downgoing.   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 Vascular risk factors of smoking, obstructive sleep apnea and alcohol use    PLAN: I had a long d/w patient about his recent embolic stroke, risk for recurrent stroke/TIAs, personally independently reviewed imaging studies and stroke evaluation results and answered questions.Continue Xarelto daily  for secondary stroke prevention and maintain strict control of hypertension with blood pressure goal below 130/90, diabetes with hemoglobin A1c goal below 6.5% and lipids with LDL cholesterol goal below 70 mg/dL. I also advised the patient to eat a healthy diet with plenty of whole grains, cereals, fruits and vegetables, exercise regularly and  maintain ideal body weight he was also advised to use his CPAP regularly and to quit smoking and drinking alcohol..he was also advised to watch his fluid and salt intake as per cardiology recommendations. Follow-up with Dr. Lucia Bitter  at the heart failure clinic. Patient may be able to change from Xarelto to aspirin once his cardiac ejection fraction improves. Followup in the future with my nurse practitioner in 6 months or call earlier if necessary. Greater than 50% of time during this 25 minute visit was spent on counseling,explanation of diagnosis, planning of further management, discussion with patient and family and coordination of care Delia Heady, MD Medical Director [facility] Pager: [phone] 02/03/2016 2:16 PM Note: This document was prepared with digital dictation and possible smart phrase technology. Any transcriptional errors that result from this process are unintentional

## 2016-02-10 ENCOUNTER — Ambulatory Visit: Payer: BLUE CROSS/BLUE SHIELD | Admitting: Gastroenterology

## 2016-02-12 ENCOUNTER — Encounter (HOSPITAL_COMMUNITY): Payer: Self-pay

## 2016-02-12 ENCOUNTER — Ambulatory Visit (HOSPITAL_COMMUNITY)
Admission: RE | Admit: 2016-02-12 | Discharge: 2016-02-12 | Disposition: A | Discharge status: Home / Self Care | Payer: BLUE CROSS/BLUE SHIELD | Source: Ambulatory Visit | Attending: Cardiology | Admitting: Cardiology

## 2016-02-12 VITALS — BP 98/64 | HR 71 | Ht 70.0 in | Wt 196.0 lb

## 2016-02-12 DIAGNOSIS — Z87891 Personal history of nicotine dependence: Secondary | ICD-10-CM | POA: Diagnosis not present

## 2016-02-12 DIAGNOSIS — Z8673 Personal history of transient ischemic attack (TIA), and cerebral infarction without residual deficits: Secondary | ICD-10-CM | POA: Diagnosis not present

## 2016-02-12 DIAGNOSIS — I110 Hypertensive heart disease with heart failure: Secondary | ICD-10-CM | POA: Insufficient documentation

## 2016-02-12 DIAGNOSIS — E785 Hyperlipidemia, unspecified: Secondary | ICD-10-CM | POA: Insufficient documentation

## 2016-02-12 DIAGNOSIS — K219 Gastro-esophageal reflux disease without esophagitis: Secondary | ICD-10-CM | POA: Diagnosis not present

## 2016-02-12 DIAGNOSIS — Z8249 Family history of ischemic heart disease and other diseases of the circulatory system: Secondary | ICD-10-CM | POA: Diagnosis not present

## 2016-02-12 DIAGNOSIS — Z7901 Long term (current) use of anticoagulants: Secondary | ICD-10-CM | POA: Insufficient documentation

## 2016-02-12 DIAGNOSIS — I5022 Chronic systolic (congestive) heart failure: Secondary | ICD-10-CM | POA: Diagnosis not present

## 2016-02-12 DIAGNOSIS — I420 Dilated cardiomyopathy: Secondary | ICD-10-CM

## 2016-02-12 DIAGNOSIS — Z8489 Family history of other specified conditions: Secondary | ICD-10-CM | POA: Insufficient documentation

## 2016-02-12 DIAGNOSIS — F909 Attention-deficit hyperactivity disorder, unspecified type: Secondary | ICD-10-CM | POA: Diagnosis not present

## 2016-02-12 DIAGNOSIS — G4733 Obstructive sleep apnea (adult) (pediatric): Secondary | ICD-10-CM | POA: Diagnosis not present

## 2016-02-12 DIAGNOSIS — I63032 Cerebral infarction due to thrombosis of left carotid artery: Secondary | ICD-10-CM | POA: Diagnosis not present

## 2016-02-12 LAB — BASIC METABOLIC PANEL
Anion gap: 12 (ref 5–15)
BUN: 14 mg/dL (ref 6–20)
CHLORIDE: 102 mmol/L (ref 101–111)
CO2: 25 mmol/L (ref 22–32)
CREATININE: 0.78 mg/dL (ref 0.61–1.24)
Calcium: 9.2 mg/dL (ref 8.9–10.3)
GFR calc Af Amer: >60 mL/min (ref >60)
GFR calc non Af Amer: >60 mL/min (ref >60)
GLUCOSE: 79 mg/dL (ref 65–99)
POTASSIUM: 3.7 mmol/L (ref 3.5–5.1)
Sodium: 139 mmol/L (ref 135–145)

## 2016-02-12 NOTE — Patient Instructions (Signed)
Your physician has requested that you have an echocardiogram. Echocardiography is a painless test that uses sound waves to create images of your heart. It provides your doctor with information about the size and shape of your heart and how well your heart's chambers and valves are working. This procedure takes approximately one hour. There are no restrictions for this procedure.  Your physician recommends that you schedule a follow-up appointment in: 6 weeks with Dr Gala Romney  Do the following things EVERYDAY: 1) Weigh yourself in the morning before breakfast. Write it down and keep it in a log. 2) Take your medicines as prescribed 3) Eat low salt foods-Limit salt (sodium) to 2000 mg per day.  4) Stay as active as you can everyday 5) Limit all fluids for the day to less than 2 liters

## 2016-02-12 NOTE — Progress Notes (Addendum)
Advanced Heart Failure Clinic Note    Primary Care: Dr Wylene Simmer Primary Cardiologist: Eden Emms Primary HF: Dr. 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 Drinks 4-5 beers a week.   In June 2017, cardioembolic stroke. Echocardiogram obtained on 11/21/2015 however showed EF 20-25%, severe dilatation of the left ventricle, mild MR, moderate left atrial enlargement, moderate RV enlargement with reduced RV function. Cardiology was consulted for TEE in the setting of stroke and also LV dysfunction. TEE obtained on 11/22/2015 showed severe LV dysfunction with EF 15%, severe RV dysfunction.  Admitted 12/17/15 with N/V/diarrhea/presyncope/dyspnea. CT angio - negative for PE. CXR no acute findings. C diff negative. HF team consulted with complaints of fatigue and mild nausea.    Echo 12/17/15 showed EF dropped from 20-25% to 10% over 1 month. 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   Initial Coox 31% consistent with severe cardiogenic shock. Started on milrinone 0.375 mcg with coox up to 74%. BB stopped. Brisk diuresis with IV lasix after addition of inotropes. States it was the "best he felt in weeks".   FOBT +. Hgb remained stable throughout admission. No overt bleeding.   cMRI 12/19/15 1. Severe LV dilation with EF 16%, diffuse hypokinesis. 2. Mild RV dilation, moderately decreased RV systolic function. 3. LGE in the basal to mid inferoseptal RV insertion site. This is a nonspecific finding and often suggests volume overload/LV strain  Milrinone weaned and medications adjusted as tolerated. Losartan switched to Entresto.  K stopped with K > 4.5 and Entresto.  He was successfully weaned off milrinone completely and remained stable symptomatically for discharge. Overall he diuresed 12.2 L and down 15 lbs from admission.   He presents today for regular  follow up.Has CPAP at home now. Uncomfortable and difficult to go to sleep at first, but sleeping better through the night and feels more rested throughout the day. Only having 3-4 awakenings an hours (down from 40-50s). Denies any lightheadedness or dizziness. Playing golf and working at an [employer] with a friend for now.  Saw attention specialist several weeks ago, hasn't been started on any medicine yet (that MD wanted to further confirm with Cards). BP at home right around 100. Says he has felt slightly more fatigued since being on Coreg, but feeling better. No CP, orthopnea, or palpitations. No BRBPR or melena on Xarelto. Weight at home up and down. 193 most recently (has increased very gradually)   Past Medical History:  Diagnosis Date  . ADHD (attention deficit hyperactivity disorder)    on adderall since 2004  . Chronic systolic (congestive) heart failure (HCC)   . GERD (gastroesophageal reflux disease)   . HLD (hyperlipidemia)   . Hypertension   . Stroke (HCC)   . Tobacco abuse     Current Outpatient Prescriptions  Medication Sig Dispense Refill  . ALPRAZolam (XANAX) 0.25 MG tablet Take 1 tablet (0.25 mg total) by mouth 2 (two) times daily as needed for anxiety. 30 tablet 0  . atorvastatin (LIPITOR) 20 MG tablet Take 1 tablet (20 mg total) by mouth daily at 6 PM. 30 tablet 6  . carvedilol (COREG) 3.125 MG tablet Take 1 tablet (3.125 mg total) by mouth 2 (two) times daily. 180 tablet 3  . digoxin (LANOXIN) 0.125 MG tablet Take 1 tablet (0.125 mg total) by mouth daily. 30 tablet 6  .  folic acid (FOLVITE) 1 MG tablet Take 1 tablet (1 mg total) by mouth daily. 30 tablet 6  . furosemide (LASIX) 20 MG tablet Take 20 mg by mouth 2 (two) times daily.    . Multiple Vitamin (MULTIVITAMIN WITH MINERALS) TABS tablet Take 1 tablet by mouth daily.    . pantoprazole (PROTONIX) 40 MG tablet Take 1 tablet (40 mg total) by mouth daily. 30 tablet 11  . rivaroxaban (XARELTO) 20 MG TABS tablet Take  1 tablet (20 mg total) by mouth daily with supper. 30 tablet 11  . sacubitril-valsartan (ENTRESTO) 24-26 MG Take 1 tablet by mouth 2 (two) times daily. 60 tablet 6  . spironolactone (ALDACTONE) 25 MG tablet Take 1 tablet (25 mg total) by mouth daily. 30 tablet 6  . thiamine 100 MG tablet Take 1 tablet (100 mg total) by mouth daily. 30 tablet 0  . zolpidem (AMBIEN) 5 MG tablet Take 1 tablet (5 mg total) by mouth at bedtime as needed for sleep. 14 tablet 0   No current facility-administered medications for this encounter.     No Known Allergies    Social History   Social History  . Marital status: Single    Spouse name: N/A  . Number of children: N/A  . Years of education: N/A   Occupational History  . Not on file.   Social History Main Topics  . Smoking status: Former Smoker    Types: Cigarettes    Quit date: 11/23/2015  . Smokeless tobacco: Never Used  . Alcohol use 7.2 oz/week    12 Cans of beer per week     Comment: last had beer 11/23/2015  . Drug use: No  . Sexual activity: Not on file   Other Topics Concern  . Not on file   Social History Narrative  . No narrative on file      Family History  Problem Relation Age of Onset  . Hypertension Mother   . Hypertension Father   . Cancer Maternal Grandmother     Vitals:   02/12/16 1353  BP: 98/64  Pulse: 71  SpO2: 98%  Weight: 196 lb (88.9 kg)  Height: 5\' 10"  (1.778 m)   Wt Readings from Last 3 Encounters:  02/12/16 196 lb (88.9 kg)  02/03/16 195 lb 12.8 oz (88.8 kg)  01/14/16 192 lb 6.4 oz (87.3 kg)    PHYSICAL EXAM: General:  Well appearing. No resp difficulty.  HEENT: normal Neck: supple. JVP 6-7. Carotids 2+ bilat; no bruits. No thyromegaly or nodule noted.  Cor: PMI nondisplaced. RRR. No murmurs or rubs noted. No s3 Lungs: Clear, normal effort Abdomen: soft, NT, ND, no HSM. No bruits or masses. +BS  Extremities: no cyanosis, clubbing, rash, No peripheral edema.  Neuro: alert & orientedx3, cranial  nerves grossly intact. moves all 4 extremities w/o difficulty. Affect pleasant  ASSESSMENT & PLAN:  1. Chronic Systolic HF - EF 16%10% moderately dilated/ ? Viral CM 2. H/o cardioembolic stroke 10/2015 on Xarelto 3. ADHD/Anxiety 4. Former Smoker 5. OSA on CPAP  Remains stable on current medications. NYHA class 1.   HR stable in 80s. BP remains relatively soft.    Continue Entresto 24/26, Spiro 25 mg daily, and Digoxin 0.125 mg daily. Continue Coreg 3.125 mg BID.  Will not up-titrate with soft pressures and fatigue.  BMET today.   Volume status stable on exam. Continue lasix 20 mg BID. Can take extra 20 mg as needed.   Bedside echo today shows EF 20-25%, so  mild improvement with no real predictive value at this early stage.  Will plan for follow up in 6 weeks with Echo.   Sleep Study + for OSA with AHI 49.4. Now on CPAP and titrating.   Seeing [facility] with long history of ADHD/Anxiety. They have recommended Strattera which should be fine from a cardiovascular standpoint.  We are also OK with his PCP prescribing Xanax.     Graciella Freer, PA-C 02/12/16   Total time spent > 25 minutes. Over half that spent discussing the above.  We discussed risks of restarting stimulants. As EF improving, I told him that I would be ok with cautious trial of stimulants preferably with the lowest risk agent available.   Yehia Mcbain,MD 9:25 AM

## 2016-02-18 ENCOUNTER — Telehealth (HOSPITAL_COMMUNITY): Payer: Self-pay | Admitting: *Deleted

## 2016-02-18 NOTE — Telephone Encounter (Signed)
Pt states his PCP referred him to Dr Elisabeth Most at [facility] and he request we fax our last OV note w/his med recommendations to them at [phone], note faxed

## 2016-02-20 ENCOUNTER — Other Ambulatory Visit (HOSPITAL_COMMUNITY): Payer: Self-pay | Admitting: Cardiology

## 2016-02-20 MED ORDER — FUROSEMIDE 20 MG PO TABS
20.0000 mg | ORAL_TABLET | Freq: Two times a day (BID) | ORAL | 3 refills | Status: DC
Start: 2016-02-20 — End: 2016-03-26

## 2016-03-02 ENCOUNTER — Other Ambulatory Visit (HOSPITAL_COMMUNITY): Payer: BLUE CROSS/BLUE SHIELD

## 2016-03-10 NOTE — Addendum Note (Signed)
Encounter addended by: Dolores Patty, MD on: 03/10/2016  9:40 AM<BR>    Actions taken: Sign clinical note

## 2016-03-23 ENCOUNTER — Ambulatory Visit (INDEPENDENT_AMBULATORY_CARE_PROVIDER_SITE_OTHER): Payer: BLUE CROSS/BLUE SHIELD | Admitting: Neurology

## 2016-03-23 ENCOUNTER — Encounter: Payer: Self-pay | Admitting: Neurology

## 2016-03-23 VITALS — BP 120/80 | HR 64 | Resp 20 | Ht 71.0 in | Wt 206.0 lb

## 2016-03-23 DIAGNOSIS — G4733 Obstructive sleep apnea (adult) (pediatric): Secondary | ICD-10-CM

## 2016-03-23 DIAGNOSIS — I509 Heart failure, unspecified: Secondary | ICD-10-CM

## 2016-03-23 DIAGNOSIS — Z9989 Dependence on other enabling machines and devices: Secondary | ICD-10-CM | POA: Diagnosis not present

## 2016-03-23 DIAGNOSIS — G4737 Central sleep apnea in conditions classified elsewhere: Secondary | ICD-10-CM | POA: Diagnosis not present

## 2016-03-23 NOTE — Progress Notes (Signed)
SLEEP MEDICINE CLINIC   Provider:  Melvyn Novasarmen  Arieana Somoza, M D  Referring Provider: Gaspar Garbeisovec, Richard W, MD Primary Care Physician:  Gaspar Garbeichard W Tisovec, MD  Chief Complaint  Patient presents with  . Follow-up    cpap, feels a little better    HPI:  Austin Tran is a 30 y.o. male , seen here as a referral from Dr. Guerry Bruinichard Tisovec for sleep evaluation,   Chief complaint according to patient : " I was hospitalized in July and another time last week, was discharged on Monday, 12-23-2015 ".  Austin. Austin Tran had been in June hospitalized with a viral endocarditis, reducing his ejection fraction at the time to 20-25 percent and requiring anticoagulation to be implemented. 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 Carotid Doppler studies, a CT angiogram of head and neck were all within normal limits, and he was placed on anticoagulation and statins. The left temporal infarct manifested an expressive aphasia , noted when he difficulties to read and text on his mobile device at 11 AM, ( he worked late the night before and had a night cap , felt short of breath at night , when walking to his car.) - but at 11 AM he felt he was unable to speak. also felt dehydrated, but fluid intake didn't change the symptoms- many of  which have now completely  resolved. Dr Wylene Simmerisovec also reported the patient uses alcohol heavily and was using amphetamines.  He remembers having a nonproductive cough for almost 2 months stretching through April and May, and in retrospect this was probably the time he contracted a virus.  Out of a complete 14 system review, the patient complains of only the following symptoms, and all other reviewed systems are negative. ADHD, ADD, aphasia, chest tightness, air hungry, dizziness. Snoring. Sleep talking. Hypoxemia during sleep in hospital.  BP and HR changing, apnea.    History from 03/23/2016 Austin. Austin Tran is here today to follow-up on  his recent sleep study he also has meanwhile seen mitral colic Dr. Delia HeadyPramod sethi, MD , whose note I have copied  Below.  This patients sleep study from 01/05/2016 confirmed severe sleep apnea likely promoted by his congestive heart failure. His AHI was 49.4 per hour of sleep during REM sleeps is exacerbated 251.4 all sleep was seen in supine position. Remarkable was the father short desaturation time under 90% oxygen saturation was only 11.6 minutes. CPAP was initiated at 5 cm water pressure and elevated to 11. Sleep efficiency was 92.1%, the patient was very well able to adjust to CPAP use. I ordered a machine for him that could be converted to a BiPAP machine should the need arise. Initially using an Eson nasal mask, but switched toFFM.  AIRFIT medium , ResMed mask.  I'm also able to see the compliance result of the patient has been using the machine 97% of the time since it was issued to him. He is using it on average 5 hours and 51 minutes at night using an AutoSet between 8 and 12 cm water pressure with 3 cm EPR but straddling the upper pressure limits. The 95th percentile pressure was 11.3 cm, residual AHI was 3.8. Off the residual apneas nearly half with central and half obstructive in nature.   Epworth score 4 from  10 , before CPAP  How likely are you to doze in the following situations: 0 = not likely, 1 = slight chance, 2 = moderate chance,  3 = high chance- Fatigue severity score was 47 .  I have attached Dr. Marlis Edelson last note to this visitHPI: Austin Tran is a 56 year Caucasian male seen today for first office follow-up visit following hospital admission for stroke in June 2017. Austin Tran is an 30 y.o. male who presented to the Emergency Department  On 11/21/15 complaining of a gradually improving episode of moderate expressive aphasia (difficulty expressing words and forming sentences) that was first noticed at 12:30 pm  11/20/2015 ([facility]) after awakening from a nap. There are no specific  mitigating or exacerbating factors. Per chart he reported associated, vaguely described, bilateral visual disturbance with trouble reading that lasted for 15 minutes. He forced himself to vomit once thinking he might have been poisoned. Currently he is feeling almost back to baseline. Girlfriend did not note any seizure activity. No known history of clotting deficiency in family. Patient was not administered IV t-PA secondary to minimal symptoms. He was admitted  for further evaluation and treatment. CT scan of the head was unremarkable but MRI scan showed a small posterior temporal left MCA branch infarct. Carotid ultrasound showed no significant extracranial stenosis. Transthoracic echo showed poor ejection fraction of 20-25%. Transesophageal echocardiogram showed no cardiac source of embolism but globally diffused LV function with low ejection fraction. LDL cholesterol was 128 mg percent. Albumin A1c was 5.3. Hypercoagulable labs were negative. CT angiogram of the brain and neck both did not reveal significant large vessel intracranial stenosis or occlusion. 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 He is seen by cardiology in consultation and subsequently had outpatient cardiac MRI. He has had outpatient follow-up with Dr. Teena Dunk on in the congestive heart failure clinic. He was initially started on eliquis but subsequently was seen in the emergency room on July 25 as well as was admitted on July 31 with exacerbation of CHF. He required diuresis and adjustment of his cardiac medications. He states he is doing much better now. He is more energy in fact he has been playing golf. He has been careful in watching his fluid and salt intake. He is presently on Xarelto which is tolerating well without muscle aches or pain. States his speech and word finding difficulties have completely recovered.   He has no neurological complaints or deficits and the present time. He has an  upcoming follow-up visit at the CHF clinic.  P.S.     Social History   Social History  . Marital status: Single    Spouse name: N/A  . Number of children: N/A  . Years of education: N/A   Occupational History  . Not on file.   Social History Main Topics  . Smoking status: Former Smoker    Types: Cigarettes    Quit date: 11/23/2015  . Smokeless tobacco: Never Used  . Alcohol use 7.2 oz/week    12 Cans of beer per week     Comment: last had beer 11/23/2015  . Drug use: No  . Sexual activity: Not on file   Other Topics Concern  . Not on file   Social History Narrative  . No narrative on file    Family History  Problem Relation Age of Onset  . Hypertension Mother   . Hypertension Father   . Cancer Maternal Grandmother     Past Medical History:  Diagnosis Date  . ADHD (attention deficit hyperactivity disorder)    on adderall since 2004  . Chronic systolic (congestive) heart failure   .  GERD (gastroesophageal reflux disease)   . HLD (hyperlipidemia)   . Hypertension   . Stroke (HCC)   . Tobacco abuse     Past Surgical History:  Procedure Laterality Date  . EYE SURGERY    . KNEE SURGERY    . TEE WITHOUT CARDIOVERSION N/A 11/22/2015   Procedure: TRANSESOPHAGEAL ECHOCARDIOGRAM (TEE);  Surgeon: Lewayne Bunting, MD;  Location: [facility] ENDOSCOPY;  Service: Cardiovascular;  Laterality: N/A;    Current Outpatient Prescriptions  Medication Sig Dispense Refill  . ALPRAZolam (XANAX) 0.25 MG tablet Take 1 tablet (0.25 mg total) by mouth 2 (two) times daily as needed for anxiety. 30 tablet 0  . atomoxetine (STRATTERA) 80 MG capsule Take 80 mg by mouth daily.  1  . atorvastatin (LIPITOR) 20 MG tablet Take 1 tablet (20 mg total) by mouth daily at 6 PM. 30 tablet 6  . carvedilol (COREG) 3.125 MG tablet Take 1 tablet (3.125 mg total) by mouth 2 (two) times daily. 180 tablet 3  . digoxin (LANOXIN) 0.125 MG tablet Take 1 tablet (0.125 mg total) by mouth daily. 30 tablet 6  . folic  acid (FOLVITE) 1 MG tablet Take 1 tablet (1 mg total) by mouth daily. 30 tablet 6  . furosemide (LASIX) 20 MG tablet Take 1 tablet (20 mg total) by mouth 2 (two) times daily. 180 tablet 3  . Multiple Vitamin (MULTIVITAMIN WITH MINERALS) TABS tablet Take 1 tablet by mouth daily.    . pantoprazole (PROTONIX) 40 MG tablet Take 1 tablet (40 mg total) by mouth daily. 30 tablet 11  . rivaroxaban (XARELTO) 20 MG TABS tablet Take 1 tablet (20 mg total) by mouth daily with supper. 30 tablet 11  . sacubitril-valsartan (ENTRESTO) 24-26 MG Take 1 tablet by mouth 2 (two) times daily. 60 tablet 6  . spironolactone (ALDACTONE) 25 MG tablet Take 1 tablet (25 mg total) by mouth daily. 30 tablet 6  . thiamine 100 MG tablet Take 1 tablet (100 mg total) by mouth daily. 30 tablet 0  . zolpidem (AMBIEN) 5 MG tablet Take 1 tablet (5 mg total) by mouth at bedtime as needed for sleep. 14 tablet 0   No current facility-administered medications for this visit.     Allergies as of 03/23/2016  . (No Known Allergies)    Vitals: BP 120/80   Pulse 64   Resp 20   Ht 5\' 11"  (1.803 m)   Wt 206 lb (93.4 kg)   BMI 28.73 kg/m  Last Weight:  Wt Readings from Last 1 Encounters:  03/23/16 206 lb (93.4 kg)   UJW:JXBJ mass index is 28.73 kg/m.     Last Height:   Ht Readings from Last 1 Encounters:  03/23/16 5\' 11"  (1.803 m)    Physical exam:  General: The patient is awake, alert and appears not in acute distress. The patient is well groomed. Head: Normocephalic, atraumatic. Neck is supple. Mallampati 2 with an elongated uvula. ,  neck circumference:17.25 . Nasal airflow patent. Retrognathia is seen.  Cardiovascular:  Regular rate and rhythm, without  murmurs or carotid bruit, and without distended neck veins. Respiratory: Lungs are clear to auscultation. Skin:  Without evidence of edema, or rash  Neurologic exam : The patient is awake and alert, oriented to place and time.   Memory subjective  described as intact.    Attention span & concentration ability appears normal.  Speech is fluent,  without dysarthria, mild dysphonia and not longer  aphasia.  Mood and affect are appropriate.  Cranial nerves:  Preserved sense of smell and taste. Pupils are equal and briskly reactive to light.Visual fields by finger perimetry are intact. Hearing to finger rub intact. Facial sensation intact to fine touch.Facial motor strength is symmetric and tongue and uvula move midline. Shoulder shrug was symmetrical.   The patient was advised of the nature of the diagnosed sleep disorder , the treatment options and risks for general a health and wellness arising from not treating the condition.  I spent more than  25  minutes of face to face time with the patient. Greater than 50% of time was spent in counseling and coordination of care. We have discussed the diagnosis and differential and I answered the patient's questions.     Assessment:  After physical and neurologic examination, review of laboratory studies,  Personal review of imaging studies, reports of other /same  Imaging studies ,  Results of polysomnography/ neurophysiology testing and pre-existing records as far as provided in visit., my assessment is   1)  He only recently fell ill with a viral endocarditis which significantly reduced his ejection fraction, hopefully temporarily. In response he developed congestive heart failure, had to be anticoagulated, and diuresed. Due to the limited ejection fraction and embolic stroke in the left temporal lobe was diagnosed in June 2017.he has to continue anticoagulant- see Dr Marlis Edelson note.  2) severe OSA , but not hypoxemia,  Compliant use of CPAP at 12 cm water, need to adjust the upper pressure window and the humidifier settings.  Patient asked to comply with at least 4 hours of nightly use.  Rv in 12 month.    Wanna Gully, MD 03/23/2016   CC: Gaspar Garbe, Md [address] [place], [place]  [address]

## 2016-03-23 NOTE — Patient Instructions (Signed)

## 2016-03-25 ENCOUNTER — Ambulatory Visit (HOSPITAL_COMMUNITY)
Admission: RE | Admit: 2016-03-25 | Discharge: 2016-03-25 | Disposition: A | Discharge status: Home / Self Care | Payer: BLUE CROSS/BLUE SHIELD | Source: Ambulatory Visit | Attending: Internal Medicine | Admitting: Internal Medicine

## 2016-03-25 ENCOUNTER — Encounter (HOSPITAL_COMMUNITY): Payer: BLUE CROSS/BLUE SHIELD | Admitting: Internal Medicine

## 2016-03-25 DIAGNOSIS — I509 Heart failure, unspecified: Secondary | ICD-10-CM | POA: Diagnosis not present

## 2016-03-25 NOTE — Progress Notes (Signed)
  Echocardiogram 2D Echocardiogram has been performed.  Austin Tran 03/25/2016, 9:48 AM

## 2016-03-26 ENCOUNTER — Encounter (HOSPITAL_COMMUNITY): Payer: Self-pay | Admitting: Internal Medicine

## 2016-03-26 ENCOUNTER — Ambulatory Visit (HOSPITAL_COMMUNITY)
Admission: RE | Admit: 2016-03-26 | Discharge: 2016-03-26 | Disposition: A | Discharge status: Home / Self Care | Payer: BLUE CROSS/BLUE SHIELD | Source: Ambulatory Visit | Attending: Internal Medicine | Admitting: Internal Medicine

## 2016-03-26 VITALS — BP 126/80 | HR 89 | Wt 203.8 lb

## 2016-03-26 DIAGNOSIS — I110 Hypertensive heart disease with heart failure: Secondary | ICD-10-CM | POA: Diagnosis present

## 2016-03-26 DIAGNOSIS — I5022 Chronic systolic (congestive) heart failure: Secondary | ICD-10-CM | POA: Insufficient documentation

## 2016-03-26 DIAGNOSIS — Z79899 Other long term (current) drug therapy: Secondary | ICD-10-CM | POA: Insufficient documentation

## 2016-03-26 DIAGNOSIS — Z87891 Personal history of nicotine dependence: Secondary | ICD-10-CM | POA: Diagnosis not present

## 2016-03-26 DIAGNOSIS — G4733 Obstructive sleep apnea (adult) (pediatric): Secondary | ICD-10-CM | POA: Insufficient documentation

## 2016-03-26 DIAGNOSIS — Z8673 Personal history of transient ischemic attack (TIA), and cerebral infarction without residual deficits: Secondary | ICD-10-CM | POA: Diagnosis not present

## 2016-03-26 DIAGNOSIS — F909 Attention-deficit hyperactivity disorder, unspecified type: Secondary | ICD-10-CM | POA: Insufficient documentation

## 2016-03-26 MED ORDER — FUROSEMIDE 40 MG PO TABS: 40.0000 mg | ORAL_TABLET | Freq: Every day | ORAL | 3 refills | Status: DC

## 2016-03-26 MED ORDER — CARVEDILOL 6.25 MG PO TABS
6.2500 mg | ORAL_TABLET | Freq: Two times a day (BID) | ORAL | 3 refills | Status: DC
Start: 2016-03-26 — End: 2016-04-22

## 2016-03-26 MED ORDER — SACUBITRIL-VALSARTAN 49-51 MG PO TABS: 1.0000 | ORAL_TABLET | Freq: Two times a day (BID) | ORAL | 3 refills | Status: DC

## 2016-03-26 NOTE — Progress Notes (Signed)
Advanced Heart Failure Clinic Note    Primary Care: Dr Wylene Simmer Primary Cardiologist: Eden Emms Primary HF: Dr. Gala Romney   HPI:  [name] is a 30 year old with a history of ADHD, CVA cardioembolic June 2017, chronic systolic heart failure diagnosed June 2017, and smoker. Drinks 4-5 beers a week.   In June 2017, cardioembolic stroke. Echocardiogram obtained on 11/21/2015 however showed EF 20-25%, severe dilatation of the left ventricle, mild MR, moderate left atrial enlargement, moderate RV enlargement with reduced RV function. Cardiology was consulted for TEE in the setting of stroke and also LV dysfunction. TEE obtained on 11/22/2015 showed severe LV dysfunction with EF 15%, severe RV dysfunction.  Admitted 12/17/15 with N/V/diarrhea/presyncope/dyspnea. CT angio - negative for PE. CXR no acute findings. C diff negative. HF team consulted with complaints of fatigue and mild nausea.    Echo 12/17/15 showed EF dropped from 20-25% to 10% over 1 month. 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   Initial Coox 31% consistent with severe cardiogenic shock. Started on milrinone 0.375 mcg with coox up to 74%. BB stopped. Brisk diuresis with IV lasix after addition of inotropes. States it was the "best he felt in weeks".   FOBT +. Hgb remained stable throughout admission. No overt bleeding.   cMRI 12/19/15 1. Severe LV dilation with EF 16%, diffuse hypokinesis. 2. Mild RV dilation, moderately decreased RV systolic function. 3. LGE in the basal to mid inferoseptal RV insertion site. This is a nonspecific finding and often suggests volume overload/LV strain  Milrinone weaned and medications adjusted as tolerated. Losartan switched to Entresto.  K stopped with K > 4.5 and Entresto.  He was successfully weaned off milrinone completely and remained stable symptomatically for discharge. Overall he diuresed 12.2 L and down 15 lbs from admission.   He returns for routine f/u.  Feeling very good. Does all activities without limitation. No edema, orthopnea or PND. Recently started on Strattera (non-stimulant) for ADD but would like to go back on stimulant, if possible.   Echo reviewed personally in clinic EF 25-30%    Past Medical History:  Diagnosis Date  . ADHD (attention deficit hyperactivity disorder)    on adderall since 2004  . Chronic systolic (congestive) heart failure   . GERD (gastroesophageal reflux disease)   . HLD (hyperlipidemia)   . Hypertension   . Stroke (HCC)   . Tobacco abuse     Current Outpatient Prescriptions  Medication Sig Dispense Refill  . ALPRAZolam (XANAX) 0.25 MG tablet Take 1 tablet (0.25 mg total) by mouth 2 (two) times daily as needed for anxiety. 30 tablet 0  . atomoxetine (STRATTERA) 80 MG capsule Take 80 mg by mouth daily.  1  . atorvastatin (LIPITOR) 20 MG tablet Take 1 tablet (20 mg total) by mouth daily at 6 PM. 30 tablet 6  . carvedilol (COREG) 3.125 MG tablet Take 1 tablet (3.125 mg total) by mouth 2 (two) times daily. 180 tablet 3  . digoxin (LANOXIN) 0.125 MG tablet Take 1 tablet (0.125 mg total) by mouth daily. 30 tablet 6  . folic acid (FOLVITE) 1 MG tablet Take 1 tablet (1 mg total) by mouth daily. 30 tablet 6  . furosemide (LASIX) 40 MG tablet Take 40 mg by mouth 2 (two) times daily.    . Multiple Vitamin (MULTIVITAMIN WITH MINERALS) TABS tablet Take 1 tablet by mouth daily.    . pantoprazole (PROTONIX) 40 MG tablet Take 1 tablet (40 mg total) by mouth  daily. 30 tablet 11  . rivaroxaban (XARELTO) 20 MG TABS tablet Take 1 tablet (20 mg total) by mouth daily with supper. 30 tablet 11  . sacubitril-valsartan (ENTRESTO) 24-26 MG Take 1 tablet by mouth 2 (two) times daily. 60 tablet 6  . spironolactone (ALDACTONE) 25 MG tablet Take 1 tablet (25 mg total) by mouth daily. 30 tablet 6  . thiamine 100 MG tablet Take 1 tablet (100 mg total) by mouth daily. 30 tablet 0   No current facility-administered medications for  this encounter.     No Known Allergies    Social History   Social History  . Marital status: Single    Spouse name: N/A  . Number of children: N/A  . Years of education: N/A   Occupational History  . Not on file.   Social History Main Topics  . Smoking status: Former Smoker    Types: Cigarettes    Quit date: 11/23/2015  . Smokeless tobacco: Never Used  . Alcohol use 7.2 oz/week    12 Cans of beer per week     Comment: last had beer 11/23/2015  . Drug use: No  . Sexual activity: Not on file   Other Topics Concern  . Not on file   Social History Narrative  . No narrative on file      Family History  Problem Relation Age of Onset  . Hypertension Mother   . Hypertension Father   . Cancer Maternal Grandmother     Vitals:   03/26/16 1522  BP: 126/80  Pulse: 89  SpO2: 99%  Weight: 203 lb 12 oz (92.4 kg)   Wt Readings from Last 3 Encounters:  03/26/16 203 lb 12 oz (92.4 kg)  03/23/16 206 lb (93.4 kg)  02/12/16 196 lb (88.9 kg)    PHYSICAL EXAM: General:  Well appearing. No resp difficulty.  HEENT: normal Neck: supple. JVP 6-7. Carotids 2+ bilat; no bruits. No thyromegaly or nodule noted.  Cor: PMI nondisplaced. RRR. No murmurs or rubs noted. No s3 Lungs: Clear, normal effort Abdomen: soft, NT, ND, no HSM. No bruits or masses. +BS  Extremities: no cyanosis, clubbing, rash, No peripheral edema.  Neuro: alert & orientedx3, cranial nerves grossly intact. moves all 4 extremities w/o difficulty. Affect pleasant  ASSESSMENT & PLAN:  1. 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 EF 25-30% on echo today.  --NYHA I.  --Will increase Entresto 49/51 and increase carvedilol to 6.25 bid --Volume status ok. Decrease lasix to 40 daily. Can take extra as needed.  --Continue digoxin 0.125  2. H/o cardioembolic stroke 10/2015 on Xarelto - LV apex now improving. Will continue Xarelto for another few months then can probably stop 3. ADHD/Anxiety - Continue  Strattera. Would hold off on stimulant at this point, if possible 4. Former Smoker 5. OSA on CPAP - Continue   Bensimhon, Daniel,MD 4:09 PM

## 2016-03-26 NOTE — Patient Instructions (Signed)
Increase Entresto 49/51 Twice daily  Increase Carvedilol 6.25mg  Twice daily  Decrease lasix 40mg  Once daily  Follow up with Dr. Gala Romney in 2 months

## 2016-04-20 ENCOUNTER — Telehealth (HOSPITAL_COMMUNITY): Payer: Self-pay | Admitting: Vascular Surgery

## 2016-04-20 NOTE — Telephone Encounter (Signed)
Pt call ed over the weekend, pt states he is feeling tired, having panic attack , he wants to be seen soon please advise

## 2016-04-20 NOTE — Telephone Encounter (Signed)
Patient called with c/o fatigue, SOB with exertion, and panic attacks/anxiety.  Feels similar to when his EF dropped in July which resulted in admission and milrinone. Added on to this Wednesday with Dr. Gala Romney. Advised if s/s worsen to come to ED for further evaluation. Patient states he feels stable to wait on apt with Bensimhon at this time.  Ave Filter, RN

## 2016-04-22 ENCOUNTER — Ambulatory Visit (HOSPITAL_COMMUNITY)
Admission: RE | Admit: 2016-04-22 | Discharge: 2016-04-22 | Disposition: A | Discharge status: Home / Self Care | Payer: BLUE CROSS/BLUE SHIELD | Source: Ambulatory Visit | Attending: Internal Medicine | Admitting: Internal Medicine

## 2016-04-22 ENCOUNTER — Encounter (HOSPITAL_COMMUNITY): Payer: Self-pay | Admitting: Internal Medicine

## 2016-04-22 VITALS — BP 123/72 | HR 66 | Wt 203.8 lb

## 2016-04-22 DIAGNOSIS — I5022 Chronic systolic (congestive) heart failure: Secondary | ICD-10-CM | POA: Insufficient documentation

## 2016-04-22 DIAGNOSIS — Z79899 Other long term (current) drug therapy: Secondary | ICD-10-CM | POA: Diagnosis not present

## 2016-04-22 DIAGNOSIS — Z87891 Personal history of nicotine dependence: Secondary | ICD-10-CM | POA: Diagnosis not present

## 2016-04-22 DIAGNOSIS — Z5189 Encounter for other specified aftercare: Secondary | ICD-10-CM | POA: Insufficient documentation

## 2016-04-22 DIAGNOSIS — F909 Attention-deficit hyperactivity disorder, unspecified type: Secondary | ICD-10-CM | POA: Diagnosis not present

## 2016-04-22 DIAGNOSIS — Z8673 Personal history of transient ischemic attack (TIA), and cerebral infarction without residual deficits: Secondary | ICD-10-CM | POA: Insufficient documentation

## 2016-04-22 DIAGNOSIS — F419 Anxiety disorder, unspecified: Secondary | ICD-10-CM | POA: Diagnosis not present

## 2016-04-22 DIAGNOSIS — R002 Palpitations: Secondary | ICD-10-CM

## 2016-04-22 DIAGNOSIS — I110 Hypertensive heart disease with heart failure: Secondary | ICD-10-CM | POA: Insufficient documentation

## 2016-04-22 DIAGNOSIS — G4733 Obstructive sleep apnea (adult) (pediatric): Secondary | ICD-10-CM | POA: Diagnosis not present

## 2016-04-22 LAB — COMPREHENSIVE METABOLIC PANEL
ALBUMIN: 4 g/dL (ref 3.5–5.0)
ALK PHOS: 60 U/L (ref 38–126)
ALT: 30 U/L (ref 17–63)
ANION GAP: 9 (ref 5–15)
AST: 30 U/L (ref 15–41)
BUN: 13 mg/dL (ref 6–20)
CALCIUM: 9.6 mg/dL (ref 8.9–10.3)
CHLORIDE: 105 mmol/L (ref 101–111)
CO2: 26 mmol/L (ref 22–32)
Creatinine, Ser: 0.87 mg/dL (ref 0.61–1.24)
GFR calc non Af Amer: >60 mL/min (ref >60)
GLUCOSE: 119 mg/dL — AB (ref 65–99)
Potassium: 3.6 mmol/L (ref 3.5–5.1)
SODIUM: 140 mmol/L (ref 135–145)
Total Bilirubin: 0.6 mg/dL (ref 0.3–1.2)
Total Protein: 7 g/dL (ref 6.5–8.1)

## 2016-04-22 LAB — MAGNESIUM: Magnesium: 2 mg/dL (ref 1.7–2.4)

## 2016-04-22 LAB — BRAIN NATRIURETIC PEPTIDE: B NATRIURETIC PEPTIDE 5: 42.2 pg/mL (ref 0.0–100.0)

## 2016-04-22 MED ORDER — CARVEDILOL 3.125 MG PO TABS: 3.1250 mg | ORAL_TABLET | Freq: Two times a day (BID) | ORAL | 3 refills | Status: DC

## 2016-04-22 MED ORDER — CITALOPRAM HYDROBROMIDE 10 MG PO TABS: 10.0000 mg | ORAL_TABLET | Freq: Every day | ORAL | 3 refills | Status: DC

## 2016-04-22 NOTE — Patient Instructions (Signed)
Labs today (will call for abnormal results, otherwise no news is good news)  Decrease Carvedilol to 3.125 (1 Tab) Two times Daily  Start Celexa 10 mg (1 Tab) Once Daily  30-Day Cardiac Monitor has been ordered for you to wear.  Follow up in 1 Month

## 2016-04-22 NOTE — Progress Notes (Signed)
Advanced Heart Failure Clinic Note    Primary Care: Dr Wylene Simmer Primary Cardiologist: Eden Emms Primary HF: Dr. 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 Drinks 4-5 beers a week.   In June 2017, cardioembolic stroke. Echocardiogram obtained on 11/21/2015 however showed EF 20-25%, severe dilatation of the left ventricle, mild MR, moderate left atrial enlargement, moderate RV enlargement with reduced RV function. Cardiology was consulted for TEE in the setting of stroke and also LV dysfunction. TEE obtained on 11/22/2015 showed severe LV dysfunction with EF 15%, severe RV dysfunction.  Admitted 12/17/15 with N/V/diarrhea/presyncope/dyspnea. CT angio - negative for PE. CXR no acute findings. C diff negative. HF team consulted with complaints of fatigue and mild nausea.    Echo 12/17/15 showed EF dropped from 20-25% to 10% over 1 month. 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   Initial Coox 31% consistent with severe cardiogenic shock. Started on milrinone 0.375 mcg with coox up to 74%. BB stopped. Brisk diuresis with IV lasix after addition of inotropes. States it was the "best he felt in weeks".   FOBT +. Hgb remained stable throughout admission. No overt bleeding.   cMRI 12/19/15 1. Severe LV dilation with EF 16%, diffuse hypokinesis. 2. Mild RV dilation, moderately decreased RV systolic function. 3. LGE in the basal to mid inferoseptal RV insertion site. This is a nonspecific finding and often suggests volume overload/LV strain  Milrinone weaned and medications adjusted as tolerated. Losartan switched to Entresto.  K stopped with K > 4.5 and Entresto.  He was successfully weaned off milrinone completely and remained stable symptomatically for discharge. Overall he diuresed 12.2 L and down 15 lbs from admission.   He presents for add-on visit. We  saw him 4 weeks ago and increase Entresto and carvedilol. Over past 2 weeks has been more fatigued and feels like he is having panic attacks. Says his hearts start racing and he feels like he has to throw up. Worse when he is in public. Denies presyncope. Has had 3 severe episodes over past 2 weeks and 5-6 less severe episodes. Lasts 15 minutes and resolve.  Says he concentrates on his breathing and drinks water and they go away. No clear triggers. Weight unchanged. No edema, orthopnea or PND. Feels depressed about his situation. Sees Amy Andria Meuse.   Echo 03/25/16  EF 25-30% (up from 15%)     Past Medical History:  Diagnosis Date  . ADHD (attention deficit hyperactivity disorder)    on adderall since 2004  . Chronic systolic (congestive) heart failure   . GERD (gastroesophageal reflux disease)   . HLD (hyperlipidemia)   . Hypertension   . Stroke (HCC)   . Tobacco abuse     Current Outpatient Prescriptions  Medication Sig Dispense Refill  . ALPRAZolam (XANAX) 0.25 MG tablet Take 1 tablet (0.25 mg total) by mouth 2 (two) times daily as needed for anxiety. 30 tablet 0  . atomoxetine (STRATTERA) 80 MG capsule Take 80 mg by mouth daily.  1  . atorvastatin (LIPITOR) 20 MG tablet Take 1 tablet (20 mg total) by mouth daily at 6 PM. 30 tablet 6  . carvedilol (COREG) 6.25 MG tablet Take 1 tablet (6.25 mg total) by mouth 2 (two) times daily. 180 tablet 3  . digoxin (LANOXIN) 0.125 MG tablet Take 1 tablet (0.125 mg total) by mouth daily. 30  tablet 6  . folic acid (FOLVITE) 1 MG tablet Take 1 tablet (1 mg total) by mouth daily. 30 tablet 6  . furosemide (LASIX) 40 MG tablet Take 1 tablet (40 mg total) by mouth daily. 30 tablet 3  . Multiple Vitamin (MULTIVITAMIN WITH MINERALS) TABS tablet Take 1 tablet by mouth daily.    . pantoprazole (PROTONIX) 40 MG tablet Take 1 tablet (40 mg total) by mouth daily. 30 tablet 11  . rivaroxaban (XARELTO) 20 MG TABS tablet Take 1 tablet (20 mg total) by mouth daily  with supper. 30 tablet 11  . sacubitril-valsartan (ENTRESTO) 49-51 MG Take 1 tablet by mouth 2 (two) times daily. 60 tablet 3  . spironolactone (ALDACTONE) 25 MG tablet Take 1 tablet (25 mg total) by mouth daily. 30 tablet 6  . thiamine 100 MG tablet Take 1 tablet (100 mg total) by mouth daily. 30 tablet 0   No current facility-administered medications for this encounter.     No Known Allergies    Social History   Social History  . Marital status: Single    Spouse name: N/A  . Number of children: N/A  . Years of education: N/A   Occupational History  . Not on file.   Social History Main Topics  . Smoking status: Former Smoker    Types: Cigarettes    Quit date: 11/23/2015  . Smokeless tobacco: Never Used  . Alcohol use 7.2 oz/week    12 Cans of beer per week     Comment: last had beer 11/23/2015  . Drug use: No  . Sexual activity: Not on file   Other Topics Concern  . Not on file   Social History Narrative  . No narrative on file      Family History  Problem Relation Age of Onset  . Hypertension Mother   . Hypertension Father   . Cancer Maternal Grandmother     Vitals:   04/22/16 1422  BP: 123/72  Pulse: 66  SpO2: 99%  Weight: 203 lb 12.8 oz (92.4 kg)   Wt Readings from Last 3 Encounters:  04/22/16 203 lb 12.8 oz (92.4 kg)  03/26/16 203 lb 12 oz (92.4 kg)  03/23/16 206 lb (93.4 kg)    PHYSICAL EXAM: General:  Well appearing. No resp difficulty.  HEENT: normal Neck: supple. JVP 6-7. Carotids 2+ bilat; no bruits. No thyromegaly or nodule noted.  Cor: PMI nondisplaced. RRR. No murmurs or rubs noted. No s3 Lungs: Clear, normal effort Abdomen: soft, NT, ND, no HSM. No bruits or masses. +BS  Extremities: no cyanosis, clubbing, rash, No peripheral edema.  Neuro: alert & orientedx3, cranial nerves grossly intact. moves all 4 extremities w/o difficulty. Affect pleasant  ASSESSMENT & PLAN:  1. 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 EF  25-30% on echo 03/25/16 --Volume status looks good and EF recovering but symptomatically worse. Main issue is if this is truly panic attacks vs possible arrhythmia or reaction to increased carvedilol. Will decrease carvedilol to 3.125 bid. Place 30-day monitor. Start low-dose Celexa 10mg  daily (I will discuss with Dr. Elisabeth Most). Check ECG to assess QT.  --Continue Entresto 49/51 and increase carvedilol to 6.25 bid --Volume status ok. Continue lasix 40 daily. Can take extra as needed.  --Continue digoxin 0.125  2. H/o cardioembolic stroke 10/2015 on Xarelto - LV apex now improving. Will continue Xarelto for another few months then can probably stop 3. ADHD/Anxiety - Continue Strattera. Would hold off on stimulant at  this point, if possible - Start low-dose Celexa 10 mg daily.  4. Former Smoker 5. OSA on CPAP - Continue   Othella Slappey,MD 2:32 PM

## 2016-04-22 NOTE — Addendum Note (Signed)
Encounter addended by: Suezanne Cheshire, RN on: 04/22/2016  2:59 PM<BR>    Actions taken: Order list changed, Diagnosis association updated, Sign clinical note

## 2016-04-30 ENCOUNTER — Ambulatory Visit (INDEPENDENT_AMBULATORY_CARE_PROVIDER_SITE_OTHER): Payer: BLUE CROSS/BLUE SHIELD

## 2016-04-30 ENCOUNTER — Other Ambulatory Visit (HOSPITAL_COMMUNITY): Payer: Self-pay | Admitting: Internal Medicine

## 2016-04-30 ENCOUNTER — Encounter: Payer: Self-pay | Admitting: Internal Medicine

## 2016-04-30 DIAGNOSIS — R002 Palpitations: Secondary | ICD-10-CM | POA: Diagnosis not present

## 2016-04-30 DIAGNOSIS — R000 Tachycardia, unspecified: Secondary | ICD-10-CM

## 2016-04-30 DIAGNOSIS — I5022 Chronic systolic (congestive) heart failure: Secondary | ICD-10-CM

## 2016-05-14 ENCOUNTER — Other Ambulatory Visit (HOSPITAL_COMMUNITY): Payer: Self-pay | Admitting: Student

## 2016-05-28 ENCOUNTER — Ambulatory Visit (HOSPITAL_COMMUNITY)
Admission: RE | Admit: 2016-05-28 | Discharge: 2016-05-28 | Disposition: A | Discharge status: Home / Self Care | Payer: BLUE CROSS/BLUE SHIELD | Source: Ambulatory Visit | Attending: Internal Medicine | Admitting: Internal Medicine

## 2016-05-28 ENCOUNTER — Encounter (HOSPITAL_COMMUNITY): Payer: Self-pay | Admitting: Internal Medicine

## 2016-05-28 VITALS — BP 138/80 | HR 69 | Wt 204.0 lb

## 2016-05-28 DIAGNOSIS — G4733 Obstructive sleep apnea (adult) (pediatric): Secondary | ICD-10-CM | POA: Diagnosis not present

## 2016-05-28 DIAGNOSIS — I110 Hypertensive heart disease with heart failure: Secondary | ICD-10-CM | POA: Insufficient documentation

## 2016-05-28 DIAGNOSIS — F909 Attention-deficit hyperactivity disorder, unspecified type: Secondary | ICD-10-CM | POA: Diagnosis not present

## 2016-05-28 DIAGNOSIS — I5022 Chronic systolic (congestive) heart failure: Secondary | ICD-10-CM | POA: Diagnosis not present

## 2016-05-28 DIAGNOSIS — Z87891 Personal history of nicotine dependence: Secondary | ICD-10-CM | POA: Diagnosis not present

## 2016-05-28 DIAGNOSIS — I420 Dilated cardiomyopathy: Secondary | ICD-10-CM | POA: Diagnosis not present

## 2016-05-28 DIAGNOSIS — Z5189 Encounter for other specified aftercare: Secondary | ICD-10-CM | POA: Insufficient documentation

## 2016-05-28 DIAGNOSIS — Z79899 Other long term (current) drug therapy: Secondary | ICD-10-CM | POA: Insufficient documentation

## 2016-05-28 MED ORDER — CARVEDILOL 3.125 MG PO TABS: ORAL_TABLET | ORAL | 3 refills | Status: DC

## 2016-05-28 MED ORDER — SACUBITRIL-VALSARTAN 97-103 MG PO TABS: 1.0000 | ORAL_TABLET | Freq: Two times a day (BID) | ORAL | 6 refills | Status: DC

## 2016-05-28 NOTE — Progress Notes (Signed)
Advanced Heart Failure Clinic Note    Primary Care: Dr Wylene Simmer Primary Cardiologist: Eden Emms Primary HF: Dr. 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 Drinks 4-5 beers a week.   In June 2017, cardioembolic stroke. Echocardiogram obtained on 11/21/2015 however showed EF 20-25%, severe dilatation of the left ventricle, mild MR, moderate left atrial enlargement, moderate RV enlargement with reduced RV function. Cardiology was consulted for TEE in the setting of stroke and also LV dysfunction. TEE obtained on 11/22/2015 showed severe LV dysfunction with EF 15%, severe RV dysfunction.  Admitted 12/17/15 with N/V/diarrhea/presyncope/dyspnea. CT angio - negative for PE. CXR no acute findings. C diff negative. HF team consulted with complaints of fatigue and mild nausea.    Echo 12/17/15 showed EF dropped from 20-25% to 10% over 1 month. 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   Initial Coox 31% consistent with severe cardiogenic shock. Started on milrinone 0.375 mcg with coox up to 74%. BB stopped. Brisk diuresis with IV lasix after addition of inotropes. States it was the "best he felt in weeks".   FOBT +. Hgb remained stable throughout admission. No overt bleeding.   cMRI 12/19/15 1. Severe LV dilation with EF 16%, diffuse hypokinesis. 2. Mild RV dilation, moderately decreased RV systolic function. 3. LGE in the basal to mid inferoseptal RV insertion site. This is a nonspecific finding and often suggests volume overload/LV strain  Milrinone weaned and medications adjusted as tolerated. Losartan switched to Entresto.  K stopped with K > 4.5 and Entresto.  He was successfully weaned off milrinone completely and remained stable symptomatically for discharge. Overall he diuresed 12.2 L and down 15 lbs from admission.   We saw him at the end of November  2017 and he was more fatigued and having what sounded like panic attacks. We started Celexa 10 and placed an event monitor. Also cut back carvedilol to 3.125 bid He feels much better. Says he broke up with his GF and that helped a lot too. Continues to work. No DOE. Weight unchanged. No edema, orthopnea or PND.    Echo 03/25/16  EF 25-30% (up from 15%)     Past Medical History:  Diagnosis Date  . ADHD (attention deficit hyperactivity disorder)    on adderall since 2004  . Chronic systolic (congestive) heart failure   . GERD (gastroesophageal reflux disease)   . HLD (hyperlipidemia)   . Hypertension   . Stroke (HCC)   . Tobacco abuse     Current Outpatient Prescriptions  Medication Sig Dispense Refill  . ALPRAZolam (XANAX) 0.25 MG tablet Take 1 tablet (0.25 mg total) by mouth 2 (two) times daily as needed for anxiety. 30 tablet 0  . atomoxetine (STRATTERA) 80 MG capsule Take 80 mg by mouth daily.  1  . atorvastatin (LIPITOR) 20 MG tablet Take 1 tablet (20 mg total) by mouth daily at 6 PM. 30 tablet 6  . carvedilol (COREG) 3.125 MG tablet Take 1 tablet (3.125 mg total) by mouth 2 (two) times daily. 60 tablet 3  . citalopram (CELEXA) 10 MG tablet Take 1 tablet (10 mg total) by mouth daily. 30 tablet 3  . digoxin (LANOXIN) 0.125 MG tablet Take 1 tablet (0.125 mg total) by mouth daily. 30 tablet 6  . folic acid (FOLVITE) 1 MG tablet TAKE 1 TABLET (1 MG TOTAL) BY MOUTH DAILY.  30 tablet 2  . furosemide (LASIX) 40 MG tablet Take 1 tablet (40 mg total) by mouth daily. 30 tablet 3  . Multiple Vitamin (MULTIVITAMIN WITH MINERALS) TABS tablet Take 1 tablet by mouth daily.    . pantoprazole (PROTONIX) 40 MG tablet Take 1 tablet (40 mg total) by mouth daily. 30 tablet 11  . rivaroxaban (XARELTO) 20 MG TABS tablet Take 1 tablet (20 mg total) by mouth daily with supper. 30 tablet 11  . sacubitril-valsartan (ENTRESTO) 49-51 MG Take 1 tablet by mouth 2 (two) times daily. 60 tablet 3  . spironolactone  (ALDACTONE) 25 MG tablet Take 1 tablet (25 mg total) by mouth daily. 30 tablet 6  . thiamine 100 MG tablet Take 1 tablet (100 mg total) by mouth daily. 30 tablet 0   No current facility-administered medications for this encounter.     No Known Allergies    Social History   Social History  . Marital status: Single    Spouse name: N/A  . Number of children: N/A  . Years of education: N/A   Occupational History  . Not on file.   Social History Main Topics  . Smoking status: Former Smoker    Types: Cigarettes    Quit date: 11/23/2015  . Smokeless tobacco: Never Used  . Alcohol use 7.2 oz/week    12 Cans of beer per week     Comment: last had beer 11/23/2015  . Drug use: No  . Sexual activity: Not on file   Other Topics Concern  . Not on file   Social History Narrative  . No narrative on file      Family History  Problem Relation Age of Onset  . Hypertension Mother   . Hypertension Father   . Cancer Maternal Grandmother     Vitals:   05/28/16 1105  BP: 138/80  Pulse: 69  SpO2: 100%  Weight: 204 lb (92.5 kg)   Wt Readings from Last 3 Encounters:  05/28/16 204 lb (92.5 kg)  04/22/16 203 lb 12.8 oz (92.4 kg)  03/26/16 203 lb 12 oz (92.4 kg)    PHYSICAL EXAM: General:  Well appearing. No resp difficulty.  HEENT: normal Neck: supple. JVP 6-7. Carotids 2+ bilat; no bruits. No thyromegaly or nodule noted.  Cor: PMI nondisplaced. RRR. No murmurs or rubs noted. No s3 Lungs: Clear, normal effort Abdomen: soft, NT, ND, no HSM. No bruits or masses. +BS  Extremities: no cyanosis, clubbing, rash, No peripheral edema.  Neuro: alert & orientedx3, cranial nerves grossly intact. moves all 4 extremities w/o difficulty. Affect pleasant  ASSESSMENT & PLAN:  1. 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 EF 25-30% on echo 03/25/16 --Much improved. NYHA I-II. Suspect depression was major issue. Can remove heart monitor.  --Volume status looks good and EF  recovering. Holding off on ICD --Increase Entresto to 97/103 bid. Can decrease lasix as needed --Increase carvedilol to 3.12/6.25. Unable to tolerate 6.25 bid previously --Continue digoxin 0.125  -- See back in 2 months and repeat Echo to reassess EF.  2. H/o cardioembolic stroke 10/2015 on Xarelto - LV apex now improving. Will continue Xarelto for another few months then can probably stop 3. ADHD/Anxiety - Continue Strattera. Would hold off on stimulant at this point, if possible - Start low-dose Celexa 10 mg daily.  4. Former Smoker 5. OSA on CPAP - Continue   Caryl Fate,MD 11:33 AM

## 2016-05-28 NOTE — Patient Instructions (Signed)
Increase Entresto to 97/103 mg Twice daily   Increase Carvedilol to 3.125 mg (1 tab) in AM and 6.25 mg (2 tabs) in PM  Labs in 2-3 weeks  Your physician recommends that you schedule a follow-up appointment in: 2 months

## 2016-06-12 ENCOUNTER — Telehealth (HOSPITAL_COMMUNITY): Payer: Self-pay | Admitting: *Deleted

## 2016-06-12 NOTE — Telephone Encounter (Signed)
Patient called asking about results from his heart monitor that was just removed earlier this month.  I explained to him that everything was ok and nothing showed on the monitor that warranted any changes to his current care plan.  He understands and no further questions at this time.

## 2016-06-15 ENCOUNTER — Encounter (HOSPITAL_COMMUNITY): Payer: Self-pay | Admitting: Internal Medicine

## 2016-06-15 ENCOUNTER — Telehealth (HOSPITAL_COMMUNITY): Payer: Self-pay | Admitting: Internal Medicine

## 2016-06-15 NOTE — Telephone Encounter (Signed)
Did a msg through my chart and mailed out ltr and program to pt.... KJ

## 2016-06-18 ENCOUNTER — Ambulatory Visit (HOSPITAL_COMMUNITY)
Admission: RE | Admit: 2016-06-18 | Discharge: 2016-06-18 | Disposition: A | Discharge status: Home / Self Care | Payer: BLUE CROSS/BLUE SHIELD | Source: Ambulatory Visit | Attending: Cardiology | Admitting: Cardiology

## 2016-06-18 DIAGNOSIS — I420 Dilated cardiomyopathy: Secondary | ICD-10-CM

## 2016-06-18 LAB — BASIC METABOLIC PANEL
Anion gap: 7 (ref 5–15)
BUN: 14 mg/dL (ref 6–20)
CHLORIDE: 101 mmol/L (ref 101–111)
CO2: 29 mmol/L (ref 22–32)
Calcium: 9.6 mg/dL (ref 8.9–10.3)
Creatinine, Ser: 0.99 mg/dL (ref 0.61–1.24)
GFR calc Af Amer: >60 mL/min (ref >60)
GFR calc non Af Amer: >60 mL/min (ref >60)
GLUCOSE: 102 mg/dL — AB (ref 65–99)
POTASSIUM: 3.6 mmol/L (ref 3.5–5.1)
Sodium: 137 mmol/L (ref 135–145)

## 2016-07-17 ENCOUNTER — Other Ambulatory Visit (HOSPITAL_COMMUNITY): Payer: Self-pay | Admitting: Internal Medicine

## 2016-07-17 ENCOUNTER — Other Ambulatory Visit (HOSPITAL_COMMUNITY): Payer: Self-pay | Admitting: Student

## 2016-07-27 ENCOUNTER — Ambulatory Visit (HOSPITAL_COMMUNITY)
Admission: RE | Admit: 2016-07-27 | Discharge: 2016-07-27 | Disposition: A | Discharge status: Home / Self Care | Payer: BLUE CROSS/BLUE SHIELD | Source: Ambulatory Visit | Attending: Cardiology | Admitting: Cardiology

## 2016-07-27 ENCOUNTER — Encounter (HOSPITAL_COMMUNITY): Payer: Self-pay

## 2016-07-27 VITALS — BP 140/90 | HR 76 | Wt 214.2 lb

## 2016-07-27 DIAGNOSIS — G4733 Obstructive sleep apnea (adult) (pediatric): Secondary | ICD-10-CM | POA: Diagnosis not present

## 2016-07-27 DIAGNOSIS — I5022 Chronic systolic (congestive) heart failure: Secondary | ICD-10-CM | POA: Insufficient documentation

## 2016-07-27 DIAGNOSIS — F909 Attention-deficit hyperactivity disorder, unspecified type: Secondary | ICD-10-CM | POA: Insufficient documentation

## 2016-07-27 DIAGNOSIS — Z8673 Personal history of transient ischemic attack (TIA), and cerebral infarction without residual deficits: Secondary | ICD-10-CM | POA: Insufficient documentation

## 2016-07-27 DIAGNOSIS — Z79899 Other long term (current) drug therapy: Secondary | ICD-10-CM | POA: Insufficient documentation

## 2016-07-27 DIAGNOSIS — Z5189 Encounter for other specified aftercare: Secondary | ICD-10-CM | POA: Insufficient documentation

## 2016-07-27 DIAGNOSIS — Z87891 Personal history of nicotine dependence: Secondary | ICD-10-CM | POA: Diagnosis not present

## 2016-07-27 DIAGNOSIS — I420 Dilated cardiomyopathy: Secondary | ICD-10-CM | POA: Diagnosis not present

## 2016-07-27 DIAGNOSIS — I110 Hypertensive heart disease with heart failure: Secondary | ICD-10-CM | POA: Diagnosis not present

## 2016-07-27 LAB — DIGOXIN LEVEL: Digoxin Level: <0.2 ng/mL — ABNORMAL LOW (ref 0.8–2.0)

## 2016-07-27 LAB — BASIC METABOLIC PANEL
Anion gap: 8 (ref 5–15)
BUN: 12 mg/dL (ref 6–20)
CALCIUM: 9.2 mg/dL (ref 8.9–10.3)
CO2: 26 mmol/L (ref 22–32)
CREATININE: 0.82 mg/dL (ref 0.61–1.24)
Chloride: 103 mmol/L (ref 101–111)
GFR calc Af Amer: >60 mL/min (ref >60)
Glucose, Bld: 102 mg/dL — ABNORMAL HIGH (ref 65–99)
Potassium: 3.6 mmol/L (ref 3.5–5.1)
SODIUM: 137 mmol/L (ref 135–145)

## 2016-07-27 MED ORDER — CARVEDILOL 6.25 MG PO TABS: 6.2500 mg | ORAL_TABLET | Freq: Two times a day (BID) | ORAL | 6 refills | Status: DC

## 2016-07-27 NOTE — Progress Notes (Signed)
Advanced Heart Failure Clinic Note    Primary Care: Dr Wylene Simmer Primary Cardiologist: Eden Emms Primary HF: Dr. 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 Drinks 4-5 beers a week.   In June 2017, cardioembolic stroke. Echocardiogram obtained on 11/21/2015 however showed EF 20-25%, severe dilatation of the left ventricle, mild MR, moderate left atrial enlargement, moderate RV enlargement with reduced RV function. Cardiology was consulted for TEE in the setting of stroke and also LV dysfunction. TEE obtained on 11/22/2015 showed severe LV dysfunction with EF 15%, severe RV dysfunction.  Admitted 12/17/15 with N/V/diarrhea/presyncope/dyspnea. CT angio - negative for PE. CXR no acute findings. C diff negative. HF team consulted with complaints of fatigue and mild nausea.    Echo 12/17/15 showed EF dropped from 20-25% to 10% over 1 month. 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   Initial Coox 31% consistent with severe cardiogenic shock. Started on milrinone 0.375 mcg with coox up to 74%. BB stopped. Brisk diuresis with IV lasix after addition of inotropes. States it was the "best he felt in weeks".   FOBT +. Hgb remained stable throughout admission. No overt bleeding.   cMRI 12/19/15 1. Severe LV dilation with EF 16%, diffuse hypokinesis. 2. Mild RV dilation, moderately decreased RV systolic function. 3. LGE in the basal to mid inferoseptal RV insertion site. This is a nonspecific finding and often suggests volume overload/LV strain  Milrinone weaned and medications adjusted as tolerated. Losartan switched to Entresto.  K stopped with K > 4.5 and Entresto.  He was successfully weaned off milrinone completely and remained stable symptomatically for discharge. Overall he diuresed 12.2 L and down 15 lbs from admission.   He presents today for regular  follow up.  Started on Celexa several visits ago.  Has better moods, better appetite, sleeping better.  Breathing has been great. Denies any DOE.  Working on the [employer] through [employer]. No edema, orthopnea, or PND. Denies lightheadedness or dizziness. Taking all medications as directed.   Echo 03/25/16  EF 25-30% (up from 15%)    Past Medical History:  Diagnosis Date  . ADHD (attention deficit hyperactivity disorder)    on adderall since 2004  . Chronic systolic (congestive) heart failure   . GERD (gastroesophageal reflux disease)   . HLD (hyperlipidemia)   . Hypertension   . Stroke (HCC)   . Tobacco abuse     Current Outpatient Prescriptions  Medication Sig Dispense Refill  . ALPRAZolam (XANAX) 0.25 MG tablet Take 1 tablet (0.25 mg total) by mouth 2 (two) times daily as needed for anxiety. 30 tablet 0  . atorvastatin (LIPITOR) 20 MG tablet TAKE 1 TABLET (20 MG TOTAL) BY MOUTH DAILY AT 6 PM. 30 tablet 6  . carvedilol (COREG) 3.125 MG tablet Take 1 tab in AM and 2 tabs in PM 90 tablet 3  . citalopram (CELEXA) 10 MG tablet Take 1 tablet (10 mg total) by mouth daily. 30 tablet 3  . digoxin (LANOXIN) 0.125 MG tablet TAKE 1 TABLET (0.125 MG TOTAL) BY MOUTH DAILY. 30 tablet 6  . folic acid (FOLVITE) 1 MG tablet TAKE 1 TABLET (1 MG TOTAL) BY MOUTH DAILY. 30 tablet 2  . furosemide (LASIX) 40 MG tablet TAKE 1 TABLET (40 MG TOTAL) BY MOUTH DAILY. 30 tablet 3  . Multiple Vitamin (MULTIVITAMIN WITH MINERALS) TABS tablet  Take 1 tablet by mouth daily.    . pantoprazole (PROTONIX) 40 MG tablet Take 1 tablet (40 mg total) by mouth daily. 30 tablet 11  . rivaroxaban (XARELTO) 20 MG TABS tablet Take 1 tablet (20 mg total) by mouth daily with supper. 30 tablet 11  . sacubitril-valsartan (ENTRESTO) 97-103 MG Take 1 tablet by mouth 2 (two) times daily. 60 tablet 6  . spironolactone (ALDACTONE) 25 MG tablet TAKE 1 TABLET (25 MG TOTAL) BY MOUTH DAILY. 30 tablet 6  . thiamine 100 MG tablet  Take 1 tablet (100 mg total) by mouth daily. 30 tablet 0   No current facility-administered medications for this encounter.     No Known Allergies    Social History   Social History  . Marital status: Single    Spouse name: N/A  . Number of children: N/A  . Years of education: N/A   Occupational History  . Not on file.   Social History Main Topics  . Smoking status: Former Smoker    Types: Cigarettes    Quit date: 11/23/2015  . Smokeless tobacco: Never Used  . Alcohol use 7.2 oz/week    12 Cans of beer per week     Comment: last had beer 11/23/2015  . Drug use: No  . Sexual activity: Not on file   Other Topics Concern  . Not on file   Social History Narrative  . No narrative on file      Family History  Problem Relation Age of Onset  . Hypertension Mother   . Hypertension Father   . Cancer Maternal Grandmother     Vitals:   07/27/16 1106  BP: 140/90  Pulse: 76  SpO2: 96%  Weight: 214 lb 3.2 oz (97.2 kg)   Wt Readings from Last 3 Encounters:  07/27/16 214 lb 3.2 oz (97.2 kg)  05/28/16 204 lb (92.5 kg)  04/22/16 203 lb 12.8 oz (92.4 kg)    PHYSICAL EXAM: General: Well appearing. NAD  HEENT: Normal Neck: supple. JVP does not appear elevated. Carotids 2+ bilat; no bruits. No thyromegaly or nodule noted.  Cor: PMI nondisplaced. RRR. No M/G/R noted.  Lungs: Clear, normal effort.  Abdomen: soft, NT, ND, no HSM. No bruits or masses. +BS  Extremities: no cyanosis, clubbing, rash, No peripheral edema.  Neuro: alert & orientedx3, cranial nerves grossly intact. moves all 4 extremities w/o difficulty. Affect flat but appropriate.   ASSESSMENT & PLAN:  1. 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 EF 25-30% on echo 03/25/16 - Stable NYHA I-II symptoms.   - Volume status stable and EF has been recovering.  - Holding off on ICD for now. Plan repeat Echo 1 month.  - Continue Entresto 97/103 bid. Can decrease lasix as needed - Increase carvedilol  6.25 mg BID.  - Continue digoxin 0.125 mg daily. Check level today.  - Repeat echo in 1 month with change to coreg.   - Reinforced fluid restriction to < 2 L daily, sodium restriction to less than 2000 mg daily, and the importance of daily weights.    2. H/o cardioembolic stroke 10/2015 on Xarelto - LV apex now improving. Will continue Xarelto for another few months then can probably stop 3. ADHD/Anxiety - Continue Celexa 10 mg daily.  - He is off [employer]. Recommended he follow up with Dr. Zonia Kief.  4. Former Smoker - Not smoking.  5. ETOH use - Having 1 beer every 1-2 weeks. Encouraged complete cessation. - Probably  OK to stop thiamine and folate.  5. OSA on CPAP - Continue nightly CPAP.   Meds as above. BMET and digoxin level today. Repeat Echo. Follow up with MD 2 months.   Graciella Freer, PA-C  11:17 AM  Total time spent > 25 minutes over half that spent discussing the above.

## 2016-07-27 NOTE — Patient Instructions (Signed)
INCREASE Coreg to 6.2 mg,one tab twice daily  Your physician recommends that you schedule a follow-up appointment in: 2 months with DR [facility]  Your physician has requested that you have an echocardiogram. Echocardiography is a painless test that uses sound waves to create images of your heart. It provides your doctor with information about the size and shape of your heart and how well your heart's chambers and valves are working. This procedure takes approximately one hour. There are no restrictions for this procedure.  Do the following things EVERYDAY: 1) Weigh yourself in the morning before breakfast. Write it down and keep it in a log. 2) Take your medicines as prescribed 3) Eat low salt foods-Limit salt (sodium) to 2000 mg per day.  4) Stay as active as you can everyday 5) Limit all fluids for the day to less than 2 liters

## 2016-08-03 ENCOUNTER — Telehealth: Payer: Self-pay

## 2016-08-03 ENCOUNTER — Ambulatory Visit: Payer: BLUE CROSS/BLUE SHIELD | Admitting: Nurse Practitioner

## 2016-08-03 NOTE — Telephone Encounter (Signed)
Called pt to notify of office closing early today d/t weather. Left VM mssg requesting that pt call back to r/s this afternoon's appt. 

## 2016-08-15 ENCOUNTER — Other Ambulatory Visit (HOSPITAL_COMMUNITY): Payer: Self-pay | Admitting: Student

## 2016-08-15 ENCOUNTER — Other Ambulatory Visit (HOSPITAL_COMMUNITY): Payer: Self-pay | Admitting: Internal Medicine

## 2016-08-24 ENCOUNTER — Other Ambulatory Visit (HOSPITAL_COMMUNITY): Payer: Self-pay | Admitting: *Deleted

## 2016-08-24 MED ORDER — CITALOPRAM HYDROBROMIDE 10 MG PO TABS: 10.0000 mg | ORAL_TABLET | Freq: Every day | ORAL | 3 refills | Status: DC

## 2016-08-27 ENCOUNTER — Ambulatory Visit (HOSPITAL_COMMUNITY)
Admission: RE | Admit: 2016-08-27 | Discharge: 2016-08-27 | Disposition: A | Discharge status: Home / Self Care | Payer: BLUE CROSS/BLUE SHIELD | Source: Ambulatory Visit | Attending: Internal Medicine | Admitting: Internal Medicine

## 2016-08-27 DIAGNOSIS — I5022 Chronic systolic (congestive) heart failure: Secondary | ICD-10-CM | POA: Insufficient documentation

## 2016-08-27 NOTE — Progress Notes (Signed)
  Echocardiogram 2D Echocardiogram has been performed.  Leta Jungling M 08/27/2016, 3:25 PM

## 2016-08-29 ENCOUNTER — Other Ambulatory Visit (HOSPITAL_COMMUNITY): Payer: Self-pay | Admitting: Student

## 2016-08-31 ENCOUNTER — Telehealth (HOSPITAL_COMMUNITY): Payer: Self-pay | Admitting: *Deleted

## 2016-08-31 NOTE — Telephone Encounter (Signed)
Patient called in for Echo results.  Patient is aware and no further questions at this time.

## 2016-09-07 ENCOUNTER — Telehealth (HOSPITAL_COMMUNITY): Payer: Self-pay

## 2016-09-07 NOTE — Telephone Encounter (Signed)
ECHOCARDIOGRAM COMPLETE  Order: [id number]  Status:  Final result Visible to patient:  Yes (MyChart) Dx:  Chronic systolic congestive heart fai...  Notes recorded by Chyrl Civatte, RN on 09/07/2016 at 1:40 PM EDT Patient aware and agreeable, reminded up upcoming appointment 09/28/16 ------  Notes recorded by Sherald Hess, NP on 09/07/2016 at 12:12 PM EDT Please call EF improving now 45-50%. Stop digoxin. Please call.

## 2016-09-14 ENCOUNTER — Other Ambulatory Visit (HOSPITAL_COMMUNITY): Payer: Self-pay | Admitting: Student

## 2016-09-28 ENCOUNTER — Ambulatory Visit (HOSPITAL_COMMUNITY)
Admission: RE | Admit: 2016-09-28 | Discharge: 2016-09-28 | Disposition: A | Discharge status: Home / Self Care | Payer: BLUE CROSS/BLUE SHIELD | Source: Ambulatory Visit | Attending: Internal Medicine | Admitting: Internal Medicine

## 2016-09-28 ENCOUNTER — Encounter (HOSPITAL_COMMUNITY): Payer: Self-pay | Admitting: Internal Medicine

## 2016-09-28 VITALS — BP 131/85 | HR 67 | Wt 226.0 lb

## 2016-09-28 DIAGNOSIS — F419 Anxiety disorder, unspecified: Secondary | ICD-10-CM | POA: Insufficient documentation

## 2016-09-28 DIAGNOSIS — Z87891 Personal history of nicotine dependence: Secondary | ICD-10-CM | POA: Insufficient documentation

## 2016-09-28 DIAGNOSIS — F909 Attention-deficit hyperactivity disorder, unspecified type: Secondary | ICD-10-CM

## 2016-09-28 DIAGNOSIS — I428 Other cardiomyopathies: Secondary | ICD-10-CM | POA: Diagnosis not present

## 2016-09-28 DIAGNOSIS — I420 Dilated cardiomyopathy: Secondary | ICD-10-CM | POA: Diagnosis not present

## 2016-09-28 DIAGNOSIS — I5022 Chronic systolic (congestive) heart failure: Secondary | ICD-10-CM | POA: Diagnosis present

## 2016-09-28 DIAGNOSIS — G4733 Obstructive sleep apnea (adult) (pediatric): Secondary | ICD-10-CM | POA: Diagnosis not present

## 2016-09-28 DIAGNOSIS — Z8673 Personal history of transient ischemic attack (TIA), and cerebral infarction without residual deficits: Secondary | ICD-10-CM | POA: Diagnosis not present

## 2016-09-28 DIAGNOSIS — I110 Hypertensive heart disease with heart failure: Secondary | ICD-10-CM | POA: Diagnosis not present

## 2016-09-28 DIAGNOSIS — R635 Abnormal weight gain: Secondary | ICD-10-CM | POA: Diagnosis not present

## 2016-09-28 DIAGNOSIS — Z7901 Long term (current) use of anticoagulants: Secondary | ICD-10-CM | POA: Diagnosis not present

## 2016-09-28 DIAGNOSIS — Z79899 Other long term (current) drug therapy: Secondary | ICD-10-CM | POA: Diagnosis not present

## 2016-09-28 NOTE — Progress Notes (Signed)
Advanced Heart Failure Clinic Note    Primary Care: Dr Wylene Simmer Primary Cardiologist: Eden Emms Primary HF: Dr. 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 Drinks 4-5 beers a week.   In June 2017, cardioembolic stroke. Echocardiogram obtained on 11/21/2015 however showed EF 20-25%, severe dilatation of the left ventricle, mild Austin, moderate left atrial enlargement, moderate RV enlargement with reduced RV function. Cardiology was consulted for TEE in the setting of stroke and also LV dysfunction. TEE obtained on 11/22/2015 showed severe LV dysfunction with EF 15%, severe RV dysfunction.  Admitted 12/17/15 with N/V/diarrhea/presyncope/dyspnea. CT angio - negative for PE. CXR no acute findings. C diff negative. HF team consulted with complaints of fatigue and mild nausea.    Echo 12/17/15 showed EF dropped from 20-25% to 10% over 1 month. 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   Initial Coox 31% consistent with severe cardiogenic shock. Started on milrinone 0.375 mcg with coox up to 74%. BB stopped. Brisk diuresis with IV lasix after addition of inotropes. States it was the "best he felt in weeks".   FOBT +. Hgb remained stable throughout admission. No overt bleeding.   cMRI 12/19/15 1. Severe LV dilation with EF 16%, diffuse hypokinesis. 2. Mild RV dilation, moderately decreased RV systolic function. 3. LGE in the basal to mid inferoseptal RV insertion site. This is a nonspecific finding and often suggests volume overload/LV strain  Milrinone weaned and medications adjusted as tolerated. Losartan switched to Entresto.  K stopped with K > 4.5 and Entresto.  He was successfully weaned off milrinone completely and remained stable symptomatically for discharge. Overall he diuresed 12.2 L and down 15 lbs from admission.   He presents today for regular  follow up.  Since last visit had Echo which showed return of EF to near normal levels. He is up 12 lbs since last seen in clinic. Denies any DOE, orthopnea, PND, or CP. Sleeping well. Denies peripheral edema but does feel bloated. Denies lightheadedness or dizziness. Taking all medications as directed.   Echo 03/25/16  EF 25-30% (up from 15%)  Echo 08/27/2016 EF 45-50% (up from 25-30%)  Past Medical History:  Diagnosis Date  . ADHD (attention deficit hyperactivity disorder)    on adderall since 2004  . Chronic systolic (congestive) heart failure (HCC)   . GERD (gastroesophageal reflux disease)   . HLD (hyperlipidemia)   . Hypertension   . Stroke (HCC)   . Tobacco abuse     Current Outpatient Prescriptions  Medication Sig Dispense Refill  . ALPRAZolam (XANAX) 0.25 MG tablet Take 1 tablet (0.25 mg total) by mouth 2 (two) times daily as needed for anxiety. 30 tablet 0  . atorvastatin (LIPITOR) 20 MG tablet TAKE 1 TABLET (20 MG TOTAL) BY MOUTH DAILY AT 6 PM. 30 tablet 6  . carvedilol (COREG) 6.25 MG tablet Take 1 tablet (6.25 mg total) by mouth 2 (two) times daily. 60 tablet 3  . citalopram (CELEXA) 10 MG tablet Take 1 tablet (10 mg total) by mouth daily. 30 tablet 3  . folic acid (FOLVITE) 1 MG tablet TAKE 1 TABLET (1 MG TOTAL) BY MOUTH DAILY. 30 tablet 2  . furosemide (LASIX) 40 MG tablet TAKE 1 TABLET (40 MG TOTAL) BY MOUTH DAILY. 30 tablet 3  . Multiple Vitamin (MULTIVITAMIN WITH MINERALS) TABS tablet Take 1 tablet by mouth daily.    Marland Kitchen  pantoprazole (PROTONIX) 40 MG tablet Take 1 tablet (40 mg total) by mouth daily. 30 tablet 11  . rivaroxaban (XARELTO) 20 MG TABS tablet Take 1 tablet (20 mg total) by mouth daily with supper. 30 tablet 11  . sacubitril-valsartan (ENTRESTO) 97-103 MG Take 1 tablet by mouth 2 (two) times daily. 60 tablet 6  . spironolactone (ALDACTONE) 25 MG tablet TAKE 1 TABLET (25 MG TOTAL) BY MOUTH DAILY. 30 tablet 6  . thiamine 100 MG tablet Take 1 tablet (100 mg total)  by mouth daily. 30 tablet 0   No current facility-administered medications for this encounter.    No Known Allergies  Social History   Social History  . Marital status: Single    Spouse name: N/A  . Number of children: N/A  . Years of education: N/A   Occupational History  . Not on file.   Social History Main Topics  . Smoking status: Former Smoker    Types: Cigarettes    Quit date: 11/23/2015  . Smokeless tobacco: Never Used  . Alcohol use 7.2 oz/week    12 Cans of beer per week     Comment: last had beer 11/23/2015  . Drug use: No  . Sexual activity: Not on file   Other Topics Concern  . Not on file   Social History Narrative  . No narrative on file      Family History  Problem Relation Age of Onset  . Hypertension Mother   . Hypertension Father   . Cancer Maternal Grandmother     Vitals:   09/28/16 1347  BP: 131/85  Pulse: 67  SpO2: 97%  Weight: 226 lb (102.5 kg)     Wt Readings from Last 3 Encounters:  09/28/16 226 lb (102.5 kg)  07/27/16 214 lb 3.2 oz (97.2 kg)  05/28/16 204 lb (92.5 kg)    PHYSICAL EXAM: General: Well appearing. No resp difficulty. HEENT: normal Neck: supple. JVD 6-7. Carotids 2+ bilat; no bruits. No thyromegaly or nodule noted. Cor: PMI nondisplaced. RRR, No M/G/R noted Lungs: CTAB, normal effort. Abdomen: soft, non-tender, distended, no HSM. No bruits or masses. +BS  Extremities: no cyanosis, clubbing, rash, R and LLE no edema.  Neuro: alert & orientedx3, cranial nerves grossly intact. moves all 4 extremities w/o difficulty. Affect pleasant   ASSESSMENT & PLAN:  1. 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 EF 25-30% on echo 03/25/16 - Stable NYHA I-II symptoms.   - Echo 08/27/2016 with EF improved to 45-50% from 25-30%. No LV thrombus - Volume status looks OK on exam. Would continue lasix 40 mg daily with extra as needed.   - Continue Entresto 97/103 bid.  - Continue carvedilol 6.25 mg BID.  - Off  digoxin with improved EF.  - Now out of range for ICD.  - Reinforced fluid restriction to < 2 L daily, sodium restriction to less than 2000 mg daily, and the importance of daily weights.   2. H/o cardioembolic stroke 10/2015 on Xarelto - LV apex now nearly normal. - Will stop Xarelto.  3. ADHD/Anxiety - Continue Celexa 10 mg daily.  - He is off [employer]. Recommended he follow up with Dr. Zonia Kief.  4. Former Smoker - Rarely has a cigarette. May have contributed to weight loss.  5. ETOH use - Drinking occasionally. Encouraged cessation.  6. OSA on CPAP - Continue nightly CPAP.   7. Weight gain - Not volume overloaded on exam.  - Encouraged portion control, decreasing carbohydrate  intake, and increasing activity. Smoking cessation may have also affected.    - ReDS vest initially 48, with refitting measured 40%.  Suspect he is likely an outlier, and poor results due to his body habitus.   Graciella Freer, PA-C  2:06 PM  Patient seen and examined with the above-signed Advanced Practice Provider and/or Housestaff. I personally reviewed laboratory data, imaging studies and relevant notes. I independently examined the patient and formulated the important aspects of the plan. I have edited the note to reflect any of my changes or salient points. I have personally discussed the plan with the patient and/or family.  Echo images reviewed personally. EF now nearly normal. No LV clot. Volume status looks good on exam but VEST reading up. Suspect possible false +. Can re-measure at next visit. Continue HF meds as scheduled. Use lasix prn. Can stop NOAC.   F/u 2 months. Call clinic if develops DOE or orthopnea.   Arvilla Meres, MD  11:15 PM

## 2016-09-28 NOTE — Patient Instructions (Signed)
STOP Xarelto    Your physician recommends that you schedule a follow-up appointment in: 4 months with Dr Gala Romney

## 2016-10-28 ENCOUNTER — Other Ambulatory Visit: Payer: Self-pay | Admitting: Physician Assistant

## 2016-10-28 NOTE — Telephone Encounter (Signed)
Refill Request.  

## 2016-11-03 ENCOUNTER — Other Ambulatory Visit (HOSPITAL_COMMUNITY): Payer: Self-pay | Admitting: Internal Medicine

## 2016-11-04 ENCOUNTER — Other Ambulatory Visit (HOSPITAL_COMMUNITY): Payer: Self-pay | Admitting: Student

## 2016-11-06 ENCOUNTER — Other Ambulatory Visit (HOSPITAL_COMMUNITY): Payer: Self-pay | Admitting: Internal Medicine

## 2016-11-09 ENCOUNTER — Telehealth (HOSPITAL_COMMUNITY): Payer: Self-pay

## 2016-11-09 NOTE — Telephone Encounter (Signed)
Looks like the Protonix was started in July of 2017 for epigastric pain. If he is no longer complaining of GERD like symptoms, he can attempt to come off of the Protonix.   Tyler Deis. Bonnye Fava, PharmD, BCPS, CPP Clinical Pharmacist Pager: [phone] Phone: [phone] 11/09/2016 9:30 AM

## 2016-11-09 NOTE — Telephone Encounter (Signed)
Patieant asking if he is able to come off of protonix as it is expensive.  Will forward to CHF clinical pharmD Elizabeth Palau to help advise.  Ave Filter, RN

## 2016-11-13 ENCOUNTER — Other Ambulatory Visit (HOSPITAL_COMMUNITY): Payer: Self-pay | Admitting: *Deleted

## 2016-11-13 MED ORDER — FOLIC ACID 1 MG PO TABS: 1.0000 mg | ORAL_TABLET | Freq: Every day | ORAL | 3 refills | Status: DC

## 2016-11-16 NOTE — Telephone Encounter (Signed)
Left message on VM ok to try to come off protonix.  Advised is he has recurrent GERD related symptoms to call PCP to advise.  Ave Filter, RN

## 2016-11-19 ENCOUNTER — Other Ambulatory Visit (HOSPITAL_COMMUNITY): Payer: Self-pay | Admitting: Internal Medicine

## 2016-12-02 ENCOUNTER — Telehealth (HOSPITAL_COMMUNITY): Payer: Self-pay | Admitting: Pharmacist

## 2016-12-02 NOTE — Telephone Encounter (Signed)
Austin Tran called stating that his "Obamacare" was recently terminated and will not be reinstated until the beginning of next month. In the meantime, will provide him with Entresto samples.   Medication Samples have been provided to the patient.  Drug name: Sherryll Burger       Strength: 49-51 mg        Qty: 56  LOT: [id number]  Exp.Date: 10/20  Dosing instructions: Take 2 tablets twice daily   The patient has been instructed regarding the correct time, dose, and frequency of taking this medication, including desired effects and most common side effects.    Tyler Deis. Bonnye Fava, PharmD, BCPS, CPP Clinical Pharmacist Pager: [phone] Phone: [phone] 12/02/2016 9:29 AM

## 2016-12-06 ENCOUNTER — Other Ambulatory Visit: Payer: Self-pay | Admitting: Physician Assistant

## 2016-12-07 NOTE — Telephone Encounter (Signed)
Please review for refill, Thanks !  

## 2016-12-08 NOTE — Telephone Encounter (Signed)
This is a CHF pt 

## 2016-12-23 ENCOUNTER — Telehealth (HOSPITAL_COMMUNITY): Payer: Self-pay | Admitting: Pharmacist

## 2016-12-23 NOTE — Telephone Encounter (Signed)
Austin Tran called stating that his insurance will not be activated until 01/23/17 and he will run out of [place] before then. Will provide him with samples in the meantime.   Medication Samples have been provided to the patient.  Drug name: Entresto       Strength: 49-51 mg        Qty: 28  LOT: [id number]  Exp.Date: 10/19  Dosing instructions: Take 2 tablets twice daily  The patient has been instructed regarding the correct time, dose, and frequency of taking this medication, including desired effects and most common side effects.   Austin Tran 3:59 PM 12/23/2016   Austin Deis. Bonnye Fava, PharmD, BCPS, CPP Clinical Pharmacist Pager: [phone] Phone: [phone] 12/23/2016 3:54 PM

## 2016-12-29 ENCOUNTER — Telehealth (HOSPITAL_COMMUNITY): Payer: Self-pay | Admitting: *Deleted

## 2016-12-29 ENCOUNTER — Other Ambulatory Visit (HOSPITAL_COMMUNITY): Payer: Self-pay | Admitting: Internal Medicine

## 2016-12-29 NOTE — Telephone Encounter (Signed)
Patient called stating that he was lifting a heavy trash can full of soil the other day and ever since then he was been having discomfort in the middle of his chest.  He only has pain when he lays down or bends over to touch his feet.    I spoke with Maxine Glenn, PA and said this didn't sound like cardiac pain and for him to just monitor it and call us back if it gets worse.  Patient agrees with plan and no further questions.

## 2016-12-30 ENCOUNTER — Other Ambulatory Visit (HOSPITAL_COMMUNITY): Payer: Self-pay | Admitting: Pharmacist

## 2016-12-30 MED ORDER — CITALOPRAM HYDROBROMIDE 10 MG PO TABS: 10.0000 mg | ORAL_TABLET | Freq: Every day | ORAL | 5 refills | Status: DC

## 2017-01-05 ENCOUNTER — Telehealth (HOSPITAL_COMMUNITY): Payer: Self-pay | Admitting: Pharmacist

## 2017-01-05 NOTE — Telephone Encounter (Signed)
Patient states insurance will kick in in September, will provide samples in the meantime.   Medication Samples have been provided to the patient.  Drug name: Sherryll Burger       Strength: 49-51 mg         Qty: 56  LOT: [id number]  Exp.Date: 10/19  Dosing instructions: Take 2 tablets twice daily  The patient has been instructed regarding the correct time, dose, and frequency of taking this medication, including desired effects and most common side effects.   Elvin So 9:39 AM 01/05/2017

## 2017-02-02 ENCOUNTER — Telehealth (HOSPITAL_COMMUNITY): Payer: Self-pay | Admitting: *Deleted

## 2017-02-02 NOTE — Telephone Encounter (Signed)
Medication Samples have been provided to the patient.  Drug name: Sherryll Burger       Strength: 49-51 mg      Qty: 4  LOT: [id number]  Exp.Date: 8/19  Dosing instructions: Take 1 Tablet Twice daily  The patient has been instructed regarding the correct time, dose, and frequency of taking this medication, including desired effects and most common side effects.   Georgina Peer 1:55 PM 02/02/2017

## 2017-02-10 ENCOUNTER — Other Ambulatory Visit (HOSPITAL_COMMUNITY): Payer: Self-pay | Admitting: Internal Medicine

## 2017-02-11 ENCOUNTER — Other Ambulatory Visit (HOSPITAL_COMMUNITY): Payer: Self-pay | Admitting: Internal Medicine

## 2017-03-02 ENCOUNTER — Telehealth (HOSPITAL_COMMUNITY): Payer: Self-pay | Admitting: Pharmacist

## 2017-03-02 ENCOUNTER — Other Ambulatory Visit (HOSPITAL_COMMUNITY): Payer: Self-pay | Admitting: Internal Medicine

## 2017-03-02 NOTE — Telephone Encounter (Signed)
Still does not have active insurance coverage yet so will provide with samples.   Medication Samples have been provided to the patient.  Drug name: Sherryll Burger       Strength: 49-51 mg        Qty: 56  LOT: [id number]  Exp.Date: 5/20  Dosing instructions: Take 2 tablets twice daily  The patient has been instructed regarding the correct time, dose, and frequency of taking this medication, including desired effects and most common side effects.   Austin Tran 2:32 PM 03/02/2017

## 2017-03-22 ENCOUNTER — Telehealth: Payer: Self-pay | Admitting: Neurology

## 2017-03-22 NOTE — Telephone Encounter (Signed)
Patient was called a wk ago to cancel apt for 11/1. Pt never called back to reschedule or make aware that message was gotten. I have called him because the doctor can be here on 11/1. I have kept his apt on the books for 1:30 pm. LVM with this information and informed the patient to call back if this doesn't work.

## 2017-03-25 ENCOUNTER — Ambulatory Visit: Payer: BLUE CROSS/BLUE SHIELD | Admitting: Neurology

## 2017-03-28 ENCOUNTER — Other Ambulatory Visit (HOSPITAL_COMMUNITY): Payer: Self-pay | Admitting: Student

## 2017-04-01 ENCOUNTER — Ambulatory Visit (HOSPITAL_COMMUNITY)
Admission: RE | Admit: 2017-04-01 | Discharge: 2017-04-01 | Disposition: A | Discharge status: Home / Self Care | Payer: Self-pay | Source: Ambulatory Visit | Attending: Internal Medicine | Admitting: Internal Medicine

## 2017-04-01 ENCOUNTER — Encounter (HOSPITAL_COMMUNITY): Payer: Self-pay | Admitting: Internal Medicine

## 2017-04-01 ENCOUNTER — Other Ambulatory Visit: Payer: Self-pay

## 2017-04-01 VITALS — BP 141/92 | HR 81 | Wt 231.0 lb

## 2017-04-01 DIAGNOSIS — G4733 Obstructive sleep apnea (adult) (pediatric): Secondary | ICD-10-CM | POA: Insufficient documentation

## 2017-04-01 DIAGNOSIS — E785 Hyperlipidemia, unspecified: Secondary | ICD-10-CM | POA: Insufficient documentation

## 2017-04-01 DIAGNOSIS — I5022 Chronic systolic (congestive) heart failure: Secondary | ICD-10-CM | POA: Insufficient documentation

## 2017-04-01 DIAGNOSIS — I10 Essential (primary) hypertension: Secondary | ICD-10-CM

## 2017-04-01 DIAGNOSIS — Z8673 Personal history of transient ischemic attack (TIA), and cerebral infarction without residual deficits: Secondary | ICD-10-CM | POA: Insufficient documentation

## 2017-04-01 DIAGNOSIS — I110 Hypertensive heart disease with heart failure: Secondary | ICD-10-CM | POA: Insufficient documentation

## 2017-04-01 DIAGNOSIS — Z87891 Personal history of nicotine dependence: Secondary | ICD-10-CM | POA: Insufficient documentation

## 2017-04-01 DIAGNOSIS — Z79899 Other long term (current) drug therapy: Secondary | ICD-10-CM | POA: Insufficient documentation

## 2017-04-01 DIAGNOSIS — K219 Gastro-esophageal reflux disease without esophagitis: Secondary | ICD-10-CM | POA: Insufficient documentation

## 2017-04-01 DIAGNOSIS — I428 Other cardiomyopathies: Secondary | ICD-10-CM | POA: Insufficient documentation

## 2017-04-01 DIAGNOSIS — F909 Attention-deficit hyperactivity disorder, unspecified type: Secondary | ICD-10-CM | POA: Insufficient documentation

## 2017-04-01 NOTE — Patient Instructions (Signed)
Please check your BP 4-5 times a week, let us know if SBP (top number) is >140 consistently  Your physician recommends that you schedule a follow-up appointment in: 4 months with echo

## 2017-04-01 NOTE — Addendum Note (Signed)
Encounter addended by: Noralee Space, RN on: 04/01/2017 11:23 AM  Actions taken: Order list changed, Diagnosis association updated, Sign clinical note

## 2017-04-01 NOTE — Progress Notes (Signed)
Medication Samples have been provided to the patient.  Drug name: Sherryll Burger       Strength: 49/51mg         Qty: 4  LOT: [id number]  Exp.Date: 5/20  Dosing instructions: 2 tabs Twice daily   The patient has been instructed regarding the correct time, dose, and frequency of taking this medication, including desired effects and most common side effects.   Austin Tran 11:23 AM 04/01/2017

## 2017-04-01 NOTE — Progress Notes (Signed)
Advanced Heart Failure Clinic Note    Primary Care: Dr Wylene Simmer Primary Cardiologist: Eden Emms Primary HF: Dr. 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 Drinks 4-5 beers a week.   In June 2017, cardioembolic stroke. Echocardiogram obtained on 11/21/2015 showed EF 20-25%, severe dilatation of the left ventricle, mild Austin, moderate left atrial enlargement, moderate RV enlargement with reduced RV function. Cardiology was consulted for TEE in the setting of stroke and also LV dysfunction. TEE obtained on 11/22/2015 showed severe LV dysfunction with EF 15%, severe RV dysfunction.   Echo 12/17/15 showed EF dropped from 20-25% to 10% over 1 month. 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   Initial Coox 31% consistent with severe cardiogenic shock. Started on milrinone 0.375 mcg with coox up to 74%. BB stopped. Brisk diuresis with IV lasix after addition of inotropes. States it was the "best he felt in weeks".    cMRI 12/19/15 1. Severe LV dilation with EF 16%, diffuse hypokinesis. 2. Mild RV dilation, moderately decreased RV systolic function. 3. LGE in the basal to mid inferoseptal RV insertion site. This is a nonspecific finding and often suggests volume overload/LV strain  Milrinone weaned and medications adjusted as tolerated. Losartan switched to Entresto.  K stopped with K > 4.5 and Entresto.  He was successfully weaned off milrinone completely and remained stable symptomatically for discharge. Overall he diuresed 12.2 L and down 15 lbs from admission.   Echo 03/25/16  EF 25-30% (up from 15%)  Echo 4/18: EF 45-50%   We saw him at the end of November 2017 and he was more fatigued and having what sounded like panic attacks. We started Celexa 10 and placed an event monitor. Echo in April much improved EF 45-50% (viewed personally). Still feels fatigued.  Sleeps a lot. No DOE. Can walk up a flight of steps without problem. No edema. No CP. Off all ADHD meds. Using CPAP.     Past Medical History:  Diagnosis Date  . ADHD (attention deficit hyperactivity disorder)    on adderall since 2004  . Chronic systolic (congestive) heart failure (HCC)   . GERD (gastroesophageal reflux disease)   . HLD (hyperlipidemia)   . Hypertension   . Stroke (HCC)   . Tobacco abuse     Current Outpatient Medications  Medication Sig Dispense Refill  . ALPRAZolam (XANAX) 0.25 MG tablet Take 1 tablet (0.25 mg total) by mouth 2 (two) times daily as needed for anxiety. 30 tablet 0  . atorvastatin (LIPITOR) 20 MG tablet TAKE ONE TABLET BY MOUTH DAILY AT 6 PM 30 tablet 1  . carvedilol (COREG) 6.25 MG tablet TAKE ONE TABLET BY MOUTH TWICE A DAY 60 tablet 3  . citalopram (CELEXA) 10 MG tablet Take 1 tablet (10 mg total) by mouth daily. 30 tablet 5  . folic acid (FOLVITE) 1 MG tablet Take 1 tablet (1 mg total) by mouth daily. 30 tablet 3  . furosemide (LASIX) 40 MG tablet TAKE 1 TABLET (40 MG TOTAL) BY MOUTH DAILY. 30 tablet 3  . Multiple Vitamin (MULTIVITAMIN WITH MINERALS) TABS tablet Take 1 tablet by mouth daily.    . pantoprazole (PROTONIX) 40 MG tablet TAKE 1 TABLET EVERY DAY 30 tablet 11  . sacubitril-valsartan (ENTRESTO) 97-103 MG Take 1 tablet by mouth 2 (two) times daily. 60 tablet 6  . spironolactone (ALDACTONE) 25  MG tablet TAKE ONE TABLET BY MOUTH DAILY 30 tablet 1  . thiamine 100 MG tablet Take 1 tablet (100 mg total) by mouth daily. 30 tablet 0   No current facility-administered medications for this encounter.     No Known Allergies    Social History   Socioeconomic History  . Marital status: Single    Spouse name: Not on file  . Number of children: Not on file  . Years of education: Not on file  . Highest education level: Not on file  Social Needs  . Financial resource strain: Not on file  . Food insecurity - worry: Not on file  . Food  insecurity - inability: Not on file  . Transportation needs - medical: Not on file  . Transportation needs - non-medical: Not on file  Occupational History  . Not on file  Tobacco Use  . Smoking status: Former Smoker    Types: Cigarettes    Last attempt to quit: 11/23/2015    Years since quitting: 1.3  . Smokeless tobacco: Never Used  Substance and Sexual Activity  . Alcohol use: Yes    Alcohol/week: 7.2 oz    Types: 12 Cans of beer per week    Comment: last had beer 11/23/2015  . Drug use: No  . Sexual activity: Not on file  Other Topics Concern  . Not on file  Social History Narrative  . Not on file      Family History  Problem Relation Age of Onset  . Hypertension Mother   . Hypertension Father   . Cancer Maternal Grandmother     Vitals:   04/01/17 1043  BP: (!) 141/92  Pulse: 81  Weight: 231 lb (104.8 kg)   Wt Readings from Last 3 Encounters:  04/01/17 231 lb (104.8 kg)  09/28/16 226 lb (102.5 kg)  07/27/16 214 lb 3.2 oz (97.2 kg)    PHYSICAL EXAM: General:  Well appearing. No resp difficulty HEENT: normal Neck: supple. no JVD. Carotids 2+ bilat; no bruits. No lymphadenopathy or thryomegaly appreciated. Cor: PMI nondisplaced. Regular rate & rhythm. No rubs, gallops or murmurs. Lungs: clear Abdomen: soft, nontender, nondistended. No hepatosplenomegaly. No bruits or masses. Good bowel sounds. Extremities: no cyanosis, clubbing, rash, edema Neuro: alert & orientedx3, cranial nerves grossly intact. moves all 4 extremities w/o difficulty. Affect flat  ASSESSMENT & PLAN:  1. 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 EF 25-30% on echo 03/25/16 - Echo 4/18 (viewed personally today) EF much improved 45-50%  - Volume status looks good.  - Continue  Entresto to 97/103 bid. Can decrease lasix as needed - Continue carvedilol to 6.25 bid  - Continue spiro.  - See back in 4 months and repeat Echo to ensure stability 2. H/o cardioembolic stroke 10/2015  on Xarelto - Off Xarelto with normalization of EF 3. ADHD/Anxiety - Off all stimulants. Affect remains flat  - Continue Celexa 10 mg daily.  4. Former Smoker - Says he remains quit but smells like smoke 5. OSA on CPAP - Continue CPAP 6. HTN - BP elevated here today but just took meds. He will follow at home and call us if SBP persistently > 140 - Advised him to get more exercise.    Bensimhon, Daniel,MD 10:53 AM

## 2017-04-03 ENCOUNTER — Other Ambulatory Visit (HOSPITAL_COMMUNITY): Payer: Self-pay | Admitting: Student

## 2017-04-12 ENCOUNTER — Other Ambulatory Visit (HOSPITAL_COMMUNITY): Payer: Self-pay | Admitting: *Deleted

## 2017-04-12 ENCOUNTER — Telehealth (HOSPITAL_COMMUNITY): Payer: Self-pay | Admitting: *Deleted

## 2017-04-12 MED ORDER — AMLODIPINE BESYLATE 2.5 MG PO TABS: 2.5000 mg | ORAL_TABLET | Freq: Every day | ORAL | 3 refills | Status: DC

## 2017-04-12 NOTE — Telephone Encounter (Signed)
Patient called c/o high bp reading. BP 153/93 heart rate 61. Patient said he just doesn't feel well. Patient also mentioned "tingling" in hands. Per Amy start amlodipine 2.5mg  daily and contact PCP for bilateral numbness. Pt aware and agreeable. Advised patient to keep track of his bp readings and contact our office if his blood pressure was still high.

## 2017-04-28 ENCOUNTER — Telehealth (HOSPITAL_COMMUNITY): Payer: Self-pay | Admitting: Pharmacist

## 2017-04-28 ENCOUNTER — Other Ambulatory Visit (HOSPITAL_COMMUNITY): Payer: Self-pay | Admitting: Pharmacist

## 2017-04-28 MED ORDER — SACUBITRIL-VALSARTAN 97-103 MG PO TABS: 1.0000 | ORAL_TABLET | Freq: Two times a day (BID) | ORAL | 3 refills | Status: DC

## 2017-04-28 NOTE — Telephone Encounter (Signed)
Austin Tran called stating that his insurance should go through 05/25/17 but since he has had issues with his insurance for months now, I will have him sign the [employer] patient assistance foundation application and give him samples to get him through the next couple of weeks.   Medication Samples have been provided to the patient.  Drug name: Sherryll Burger       Strength: 49-51 mg        Qty: 56  LOT: [id number]  Exp.Date: 5/20  Dosing instructions: Take 2 tablets twice daily  The patient has been instructed regarding the correct time, dose, and frequency of taking this medication, including desired effects and most common side effects.   Tyler Deis Pasty Manninen 9:07 AM 04/28/2017

## 2017-04-29 ENCOUNTER — Other Ambulatory Visit (HOSPITAL_COMMUNITY): Payer: Self-pay | Admitting: Internal Medicine

## 2017-05-06 ENCOUNTER — Telehealth (HOSPITAL_COMMUNITY): Payer: Self-pay | Admitting: Pharmacist

## 2017-05-06 NOTE — Telephone Encounter (Signed)
[employer] patient assistance approved for Entresto 97-103 mg BID through 05/05/18.   Tyler Deis. Bonnye Fava, PharmD, BCPS, CPP Clinical Pharmacist Pager: [phone] Phone: [phone] 05/06/2017 12:25 PM

## 2017-06-30 ENCOUNTER — Other Ambulatory Visit (HOSPITAL_COMMUNITY): Payer: Self-pay | Admitting: Internal Medicine

## 2017-07-01 ENCOUNTER — Other Ambulatory Visit (HOSPITAL_COMMUNITY): Payer: Self-pay | Admitting: Internal Medicine

## 2017-07-01 MED ORDER — CARVEDILOL 6.25 MG PO TABS: 6.2500 mg | ORAL_TABLET | Freq: Two times a day (BID) | ORAL | 3 refills | Status: DC

## 2017-07-29 ENCOUNTER — Other Ambulatory Visit (HOSPITAL_COMMUNITY): Payer: Self-pay | Admitting: Internal Medicine

## 2017-08-05 ENCOUNTER — Encounter (HOSPITAL_COMMUNITY): Payer: Self-pay | Admitting: Internal Medicine

## 2017-08-05 ENCOUNTER — Ambulatory Visit (HOSPITAL_COMMUNITY)
Admission: RE | Admit: 2017-08-05 | Discharge: 2017-08-05 | Disposition: A | Discharge status: Home / Self Care | Payer: Self-pay | Source: Ambulatory Visit | Attending: Internal Medicine | Admitting: Internal Medicine

## 2017-08-05 ENCOUNTER — Ambulatory Visit (INDEPENDENT_AMBULATORY_CARE_PROVIDER_SITE_OTHER)
Admission: RE | Admit: 2017-08-05 | Discharge: 2017-08-05 | Disposition: A | Discharge status: Home / Self Care | Payer: Self-pay | Source: Ambulatory Visit | Attending: Internal Medicine | Admitting: Internal Medicine

## 2017-08-05 VITALS — BP 130/84 | HR 78 | Wt 222.8 lb

## 2017-08-05 DIAGNOSIS — I5022 Chronic systolic (congestive) heart failure: Secondary | ICD-10-CM | POA: Insufficient documentation

## 2017-08-05 DIAGNOSIS — Z8673 Personal history of transient ischemic attack (TIA), and cerebral infarction without residual deficits: Secondary | ICD-10-CM | POA: Insufficient documentation

## 2017-08-05 DIAGNOSIS — R197 Diarrhea, unspecified: Secondary | ICD-10-CM

## 2017-08-05 DIAGNOSIS — F909 Attention-deficit hyperactivity disorder, unspecified type: Secondary | ICD-10-CM

## 2017-08-05 NOTE — Progress Notes (Signed)
Echocardiogram 2D Echocardiogram has been performed.  Austin Tran 08/05/2017, 2:41 PM

## 2017-08-05 NOTE — Patient Instructions (Signed)
Your physician recommends that you schedule a follow-up appointment in: 6 months with Dr Bensimhon  Do the following things EVERYDAY: 1) Weigh yourself in the morning before breakfast. Write it down and keep it in a log. 2) Take your medicines as prescribed 3) Eat low salt foods-Limit salt (sodium) to 2000 mg per day.  4) Stay as active as you can everyday 5) Limit all fluids for the day to less than 2 liters   

## 2017-08-05 NOTE — Progress Notes (Signed)
Advanced Heart Failure Clinic Note    Primary Care: Dr Wylene Simmer Primary Cardiologist: Eden Emms Primary HF: Dr. 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 Drinks 4-5 beers a week.   In June 2017, cardioembolic stroke. Echocardiogram obtained on 11/21/2015 showed EF 20-25%, severe dilatation of the left ventricle, mild Austin, moderate left atrial enlargement, moderate RV enlargement with reduced RV function. Cardiology was consulted for TEE in the setting of stroke and also LV dysfunction. TEE obtained on 11/22/2015 showed severe LV dysfunction with EF 15%, severe RV dysfunction.  Echo 12/17/15 showed EF dropped from 20-25% to 10% over 1 month. 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   Initial Coox 31% consistent with severe cardiogenic shock. Started on milrinone 0.375 mcg with coox up to 74%. BB stopped. Brisk diuresis with IV lasix after addition of inotropes. Milrinone weaned and medications adjusted as tolerated. Losartan switched to Entresto. He was successfully weaned off milrinone completely and remained stable symptomatically for discharge. Overall he diuresed 12.2 L and down 15 lbs from admission.    cMRI 12/19/15 1. Severe LV dilation with EF 16%, diffuse hypokinesis. 2. Mild RV dilation, moderately decreased RV systolic function. 3. LGE in the basal to mid inferoseptal RV insertion site. This is a nonspecific finding and often suggests volume overload/LV strain   Echo 03/25/16  EF 25-30% (up from 15%)  Echo 4/18: EF 45-50%  Echo 07/2017: EF 55-60% (Personally reviewed by Dr Gala Romney)  He presents today for regular follow up. Overall, feeling great though he does have fatigue with being off his stimulants. He has abdominal swelling at times. He has palpitations in the mornings, but thinks it is related to anxiety. He has been having problems  with diarrhea for over a year. It starts after taking his medication in the morning and tapers off in the evening, but he has 5-8 BMs/day. No bloody stools or mucus. Occasional dizziness when looking at computer/reading something far away, but not with sitting to standing. No CP, SOB, edema, orthopnea, or PND. Wearing CPAP at night. Losing weight by eating better. Compliant with medications. Limits fluid and salt intake.    Past Medical History:  Diagnosis Date  . ADHD (attention deficit hyperactivity disorder)    on adderall since 2004  . Chronic systolic (congestive) heart failure (HCC)   . GERD (gastroesophageal reflux disease)   . HLD (hyperlipidemia)   . Hypertension   . Stroke (HCC)   . Tobacco abuse     Current Outpatient Medications  Medication Sig Dispense Refill  . ALPRAZolam (XANAX) 0.25 MG tablet Take 1 tablet (0.25 mg total) by mouth 2 (two) times daily as needed for anxiety. 30 tablet 0  . atorvastatin (LIPITOR) 20 MG tablet TAKE ONE TABLET BY MOUTH DAILY AT 6 PM 30 tablet 1  . carvedilol (COREG) 6.25 MG tablet Take 1 tablet (6.25 mg total) by mouth 2 (two) times daily. 60 tablet 3  . citalopram (CELEXA) 10 MG tablet TAKE 1 TABLET BY MOUTH DAILY 30 tablet 4  . folic acid (FOLVITE) 1 MG tablet Take 1 tablet (1 mg total) by mouth daily. 30 tablet 3  . furosemide (LASIX) 40 MG tablet TAKE 1 TABLET BY MOUTH DAILY 30 tablet 1  . Multiple Vitamin (MULTIVITAMIN WITH MINERALS) TABS tablet Take 1 tablet by mouth daily.    . pantoprazole (PROTONIX) 40  MG tablet TAKE 1 TABLET EVERY DAY 30 tablet 11  . sacubitril-valsartan (ENTRESTO) 97-103 MG Take 1 tablet by mouth 2 (two) times daily. 180 tablet 3  . spironolactone (ALDACTONE) 25 MG tablet TAKE ONE TABLET BY MOUTH DAILY 30 tablet 1  . thiamine 100 MG tablet Take 1 tablet (100 mg total) by mouth daily. 30 tablet 0  . amLODipine (NORVASC) 2.5 MG tablet Take 1 tablet (2.5 mg total) daily by mouth. 30 tablet 3   No current  facility-administered medications for this encounter.     No Known Allergies    Social History   Socioeconomic History  . Marital status: Single    Spouse name: Not on file  . Number of children: Not on file  . Years of education: Not on file  . Highest education level: Not on file  Social Needs  . Financial resource strain: Not on file  . Food insecurity - worry: Not on file  . Food insecurity - inability: Not on file  . Transportation needs - medical: Not on file  . Transportation needs - non-medical: Not on file  Occupational History  . Not on file  Tobacco Use  . Smoking status: Former Smoker    Types: Cigarettes    Last attempt to quit: 11/23/2015    Years since quitting: 1.7  . Smokeless tobacco: Never Used  Substance and Sexual Activity  . Alcohol use: Yes    Alcohol/week: 7.2 oz    Types: 12 Cans of beer per week    Comment: last had beer 11/23/2015  . Drug use: No  . Sexual activity: Not on file  Other Topics Concern  . Not on file  Social History Narrative  . Not on file      Family History  Problem Relation Age of Onset  . Hypertension Mother   . Hypertension Father   . Cancer Maternal Grandmother     Vitals:   08/05/17 1459  BP: 130/84  Pulse: 78  SpO2: 98%  Weight: 222 lb 12.8 oz (101.1 kg)   Wt Readings from Last 3 Encounters:  08/05/17 222 lb 12.8 oz (101.1 kg)  04/01/17 231 lb (104.8 kg)  09/28/16 226 lb (102.5 kg)    PHYSICAL EXAM: General: Well appearing. No resp difficulty. HEENT: Normal anicteric Neck: Supple. No JVD. Carotids 2+ bilat; no bruits. No thyromegaly or nodule noted. Cor: PMI nondisplaced. RRR, No M/G/R noted Lungs: CTAB, normal effort. Abdomen: Obese, Soft, non-tender, non-distended, no HSM. No bruits or masses. +BS  Extremities: No cyanosis, clubbing.  R and LLE no edema. Scaly eczematous, red rash on right shin Neuro: Alert & orientedx3, cranial nerves grossly intact. moves all 4 extremities w/o difficulty.  Affect pleasant but flattened   ASSESSMENT & PLAN:  1. 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 EF 25-30% on echo 03/25/16 - Echo 3/19 EF 55-60%. Personally reviewed by Dr Gala Romney today.  - Volume status looks good.  - Continue Entresto to 97/103 bid.  - Continue lasix 40 mg daily.  - Continue carvedilol to 6.25 bid  - Continue spiro 25 mg daily.  2. H/o cardioembolic stroke 10/2015 on Xarelto - Off Xarelto with normalization of EF. No change 3. ADHD/Anxiety - Off all stimulants. Affect flat.  - Continue Celexa 10 mg daily.  4. Former Smoker  - Denies tobacco use 5. OSA on CPAP - Continue CPAP each evening 6. HTN - BP 130/84 today.  - Continue amlodipine 2.5 mg daily.  7. Diarrhea - Recommended him going to GI for further workup for possible IBS  Follow up 6 months  Alford Highland, NP 4:11 PM   Patient seen and examined with the above-signed Advanced Practice Provider and/or Housestaff. I personally reviewed laboratory data, imaging studies and relevant notes. I independently examined the patient and formulated the important aspects of the plan. I have edited the note to reflect any of my changes or salient points. I have personally discussed the plan with the patient and/or family.  Echo reviewed personally and EF totally normalized. Volume status looks good. Overall feels well but struggling with fatigue due to stopping stimulants with NICM. We discussed to pros and cons of a cautious trial of re-introducing low-dose stimulants with close f/u of his EF. He will d/w his psychiatrist and we will coordinate.   Having lots of GI symptoms. Likely IBS however suggested several dietary changes to assess for lactose intolerance and gluten allergy. He will try these first and if fails will f/u with GI.   Arvilla Meres, MD  8:21 PM

## 2017-08-05 NOTE — Progress Notes (Signed)
  Echocardiogram 2D Echocardiogram has been performed.  Pieter Partridge 08/05/2017, 2:58 PM

## 2017-08-16 ENCOUNTER — Other Ambulatory Visit (HOSPITAL_COMMUNITY): Payer: Self-pay | Admitting: Adult Health

## 2017-10-02 ENCOUNTER — Other Ambulatory Visit (HOSPITAL_COMMUNITY): Payer: Self-pay | Admitting: Internal Medicine

## 2017-11-08 ENCOUNTER — Other Ambulatory Visit (HOSPITAL_COMMUNITY): Payer: Self-pay | Admitting: Internal Medicine

## 2017-11-20 ENCOUNTER — Other Ambulatory Visit (HOSPITAL_COMMUNITY): Payer: Self-pay | Admitting: Internal Medicine

## 2017-11-23 IMAGING — US US ABDOMEN COMPLETE
1 series · 13 of 25 positions shown · non-contrast
Comparison: None in PACs

CLINICAL DATA: Right upper quadrant and upper abdominal discomfort
for the past 10 days associated with nausea and diarrhea; recent
stroke, history of alcohol abuse

EXAM:
ABDOMEN ULTRASOUND COMPLETE

[Series 1: us abdomen complete · 0.20mm/px · 13 of 82 slices shown]
[im 1/82]
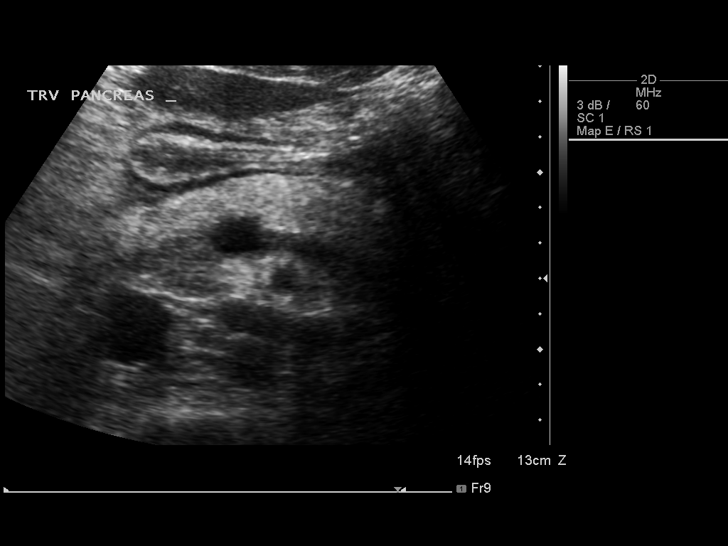
[im 7/82]
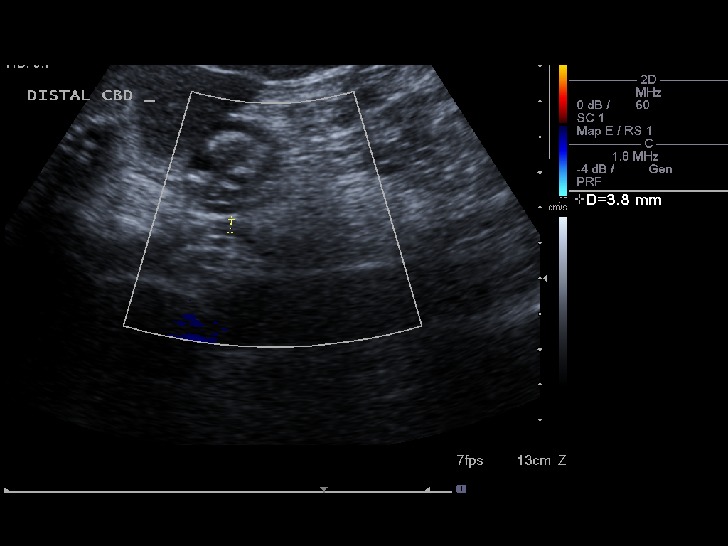
[im 14/82]
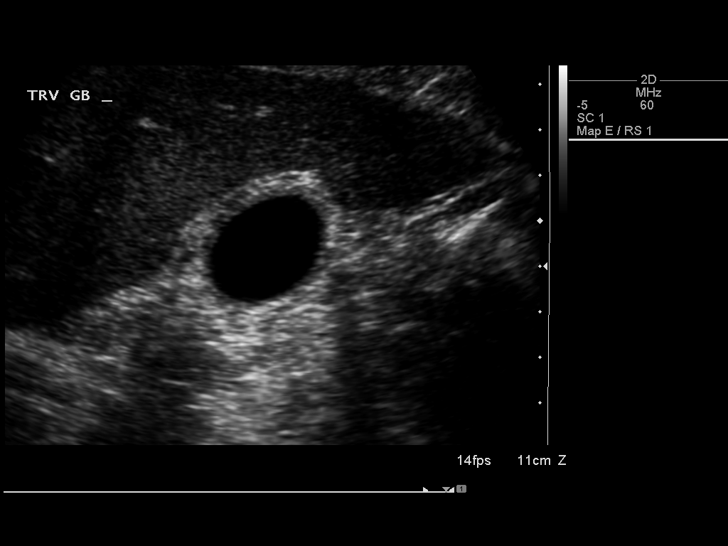
[im 21/82]
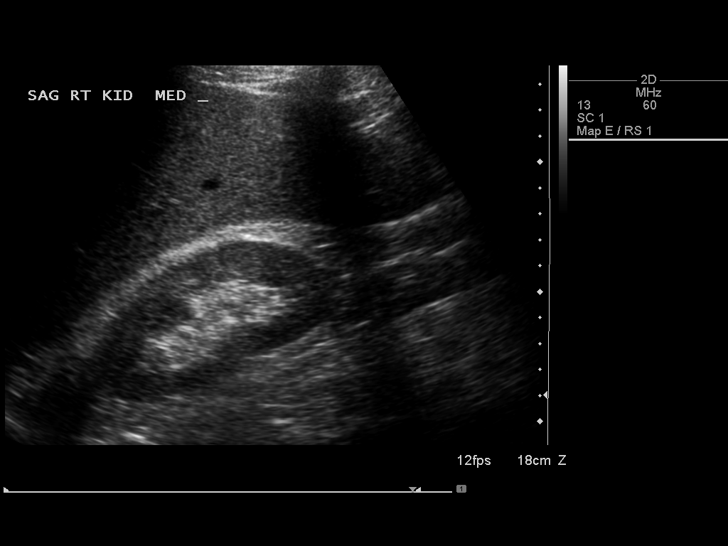
[im 28/82]
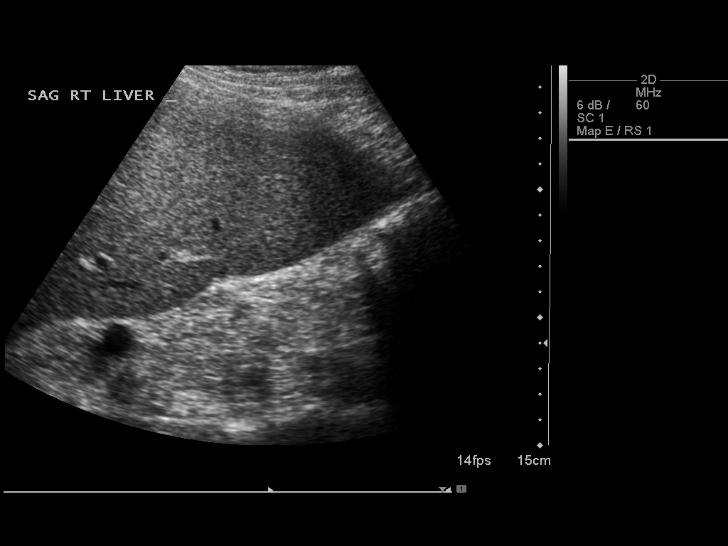
[im 34/82]
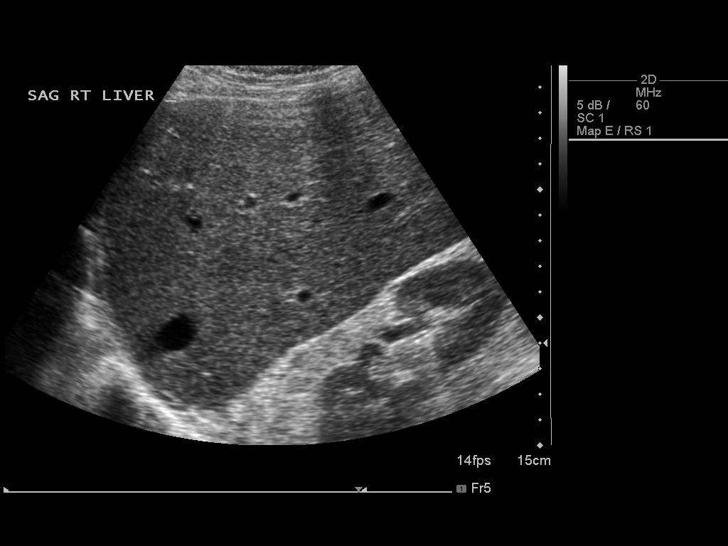
[im 41/82]
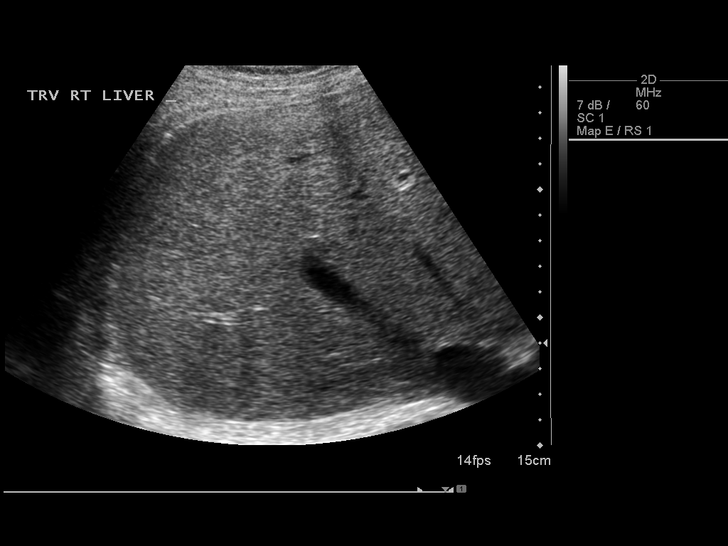
[im 48/82]
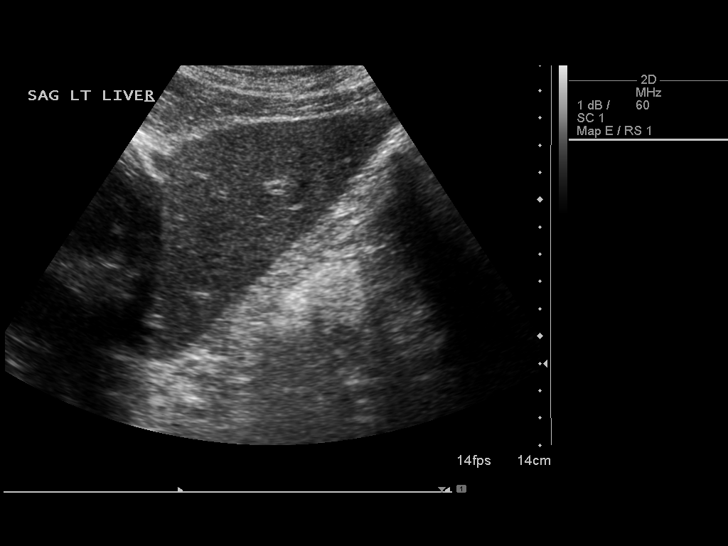
[im 55/82]
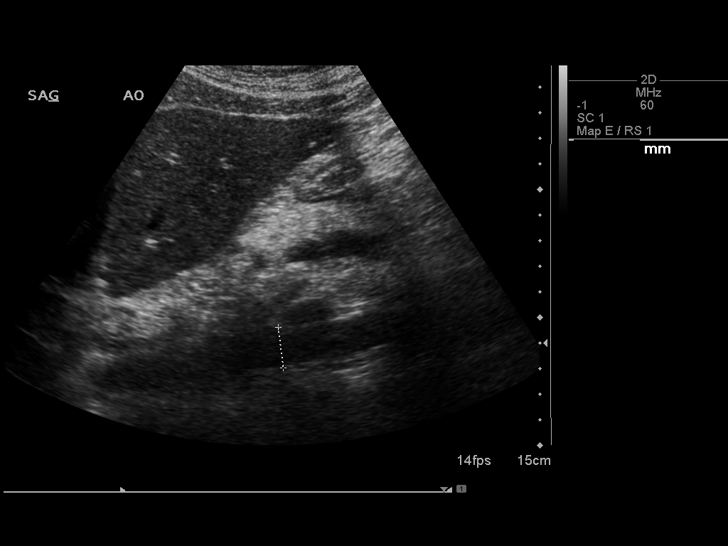
[im 61/82]
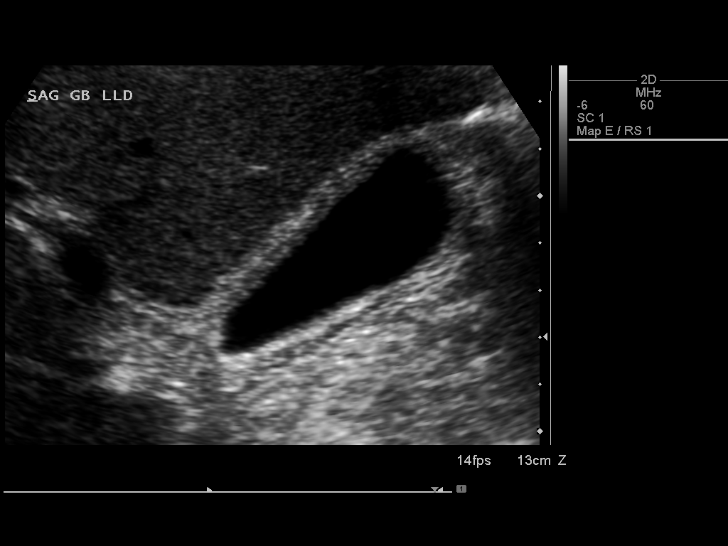
[im 68/82]
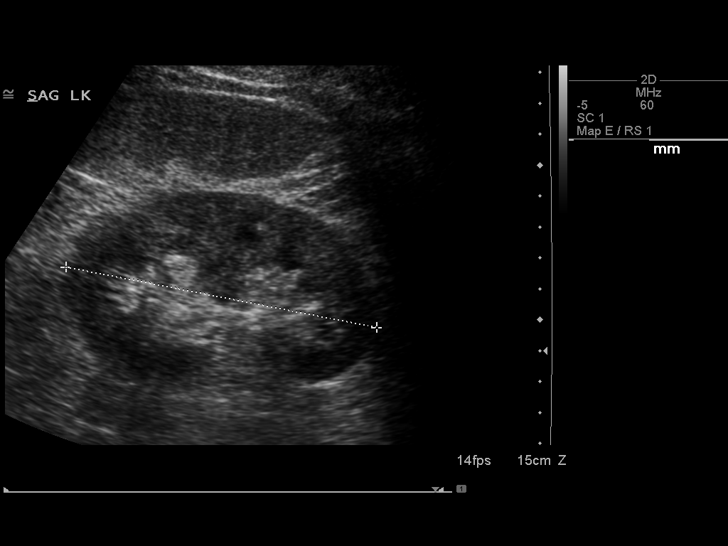
[im 75/82]
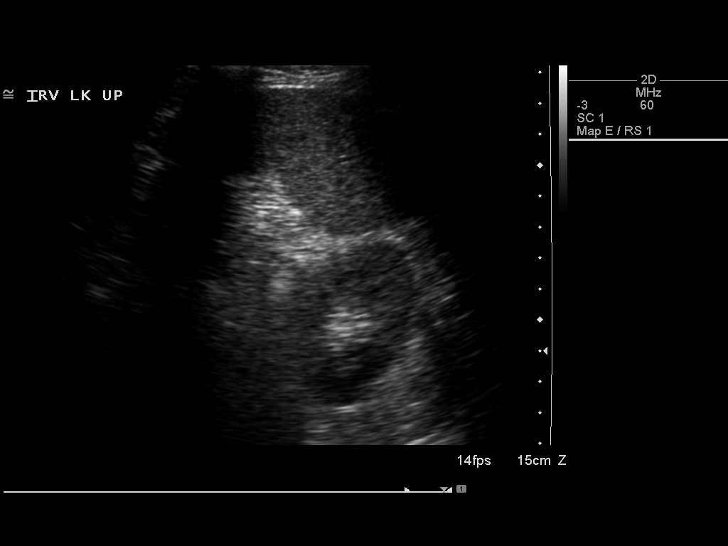
[im 82/82]
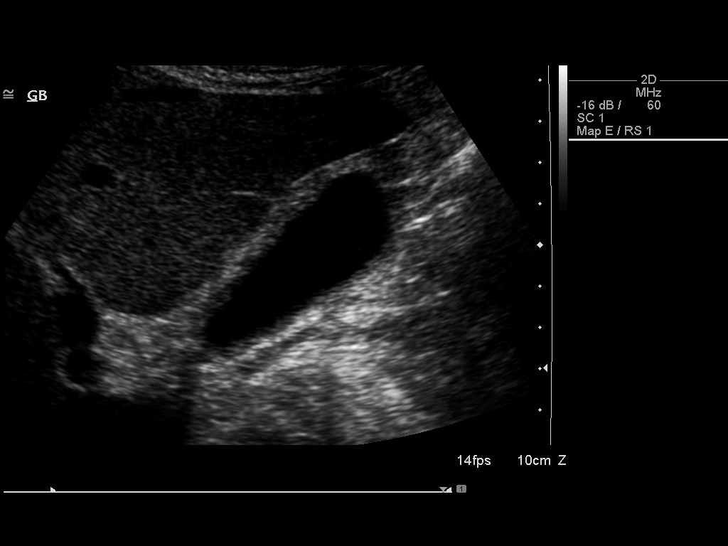

[13 of 25 positions shown; findings below may reference images not displayed]

FINDINGS: Gallbladder: No gallstones visualized. There is mild gallbladder
wall thickening. There is no pericholecystic fluid or positive
sonographic Murphy's sign.

Common bile duct: Diameter: 3.8 mm

Liver: No focal lesion identified. Within normal limits in
parenchymal echogenicity.

IVC: No abnormality visualized.

Pancreas: The pancreatic body is normal. The pancreatic tail and
portions of the pancreatic head are largely obscured by bowel gas.

Spleen: Size and appearance within normal limits.

Right Kidney: Length: 11 cm. Echogenicity within normal limits. No
mass or hydronephrosis visualized.

Left Kidney: Length: 10.2 cm. Echogenicity within normal limits. No
mass or hydronephrosis visualized.

Abdominal aorta: No aneurysm visualized.

Other findings: There is no ascites.
IMPRESSION: 1. Mild gallbladder wall thickening without evidence of gallstones
or positive sonographic Murphy's sign. Further evaluation with a
nuclear medicine hepatobiliary scan would be useful to exclude
chronic cholecystitis.
2. Limited visualization of the pancreas. No acute abnormality
observed elsewhere within the abdomen.

## 2017-12-04 ENCOUNTER — Other Ambulatory Visit (HOSPITAL_COMMUNITY): Payer: Self-pay | Admitting: Internal Medicine

## 2017-12-08 IMAGING — CT CT ANGIO CHEST
1 of 10 series · 12 of 36 positions shown · IV contrast (Iohexol (Omnipaque 350))
Comparison: Chest radiograph - earlier same day

CLINICAL DATA: Chest pain with exertion. Evaluate for pulmonary
embolism.

EXAM:
CT ANGIOGRAPHY CHEST WITH CONTRAST
TECHNIQUE: Multidetector CT imaging of the chest was performed using the
standard protocol during bolus administration of intravenous
contrast. Multiplanar CT image reconstructions and MIPs were
obtained to evaluate the vascular anatomy.
CONTRAST:  100 cc Isovue 370

[Series 506: thins pacs · axial · 0.71mm/px · z∈[+54,+290]mm · 12 of 280 slices shown]
[im 22/280  lung]
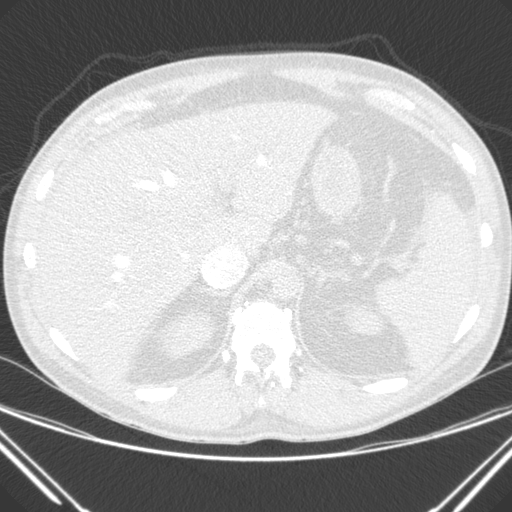
[im 43/280  mediastinal]
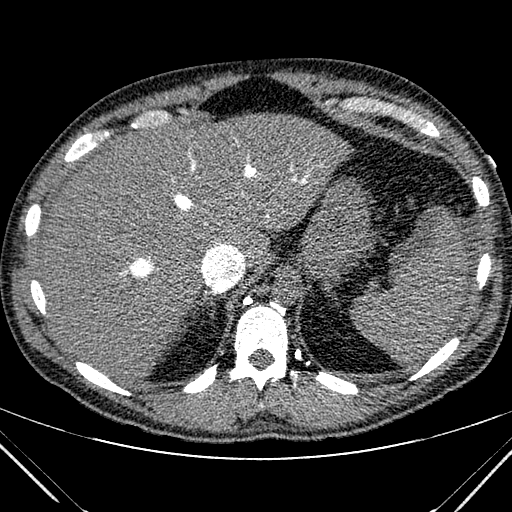
[im 65/280  lung]
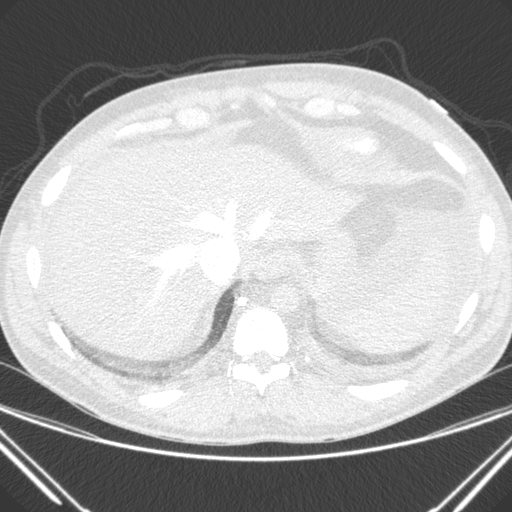
[im 86/280  mediastinal]
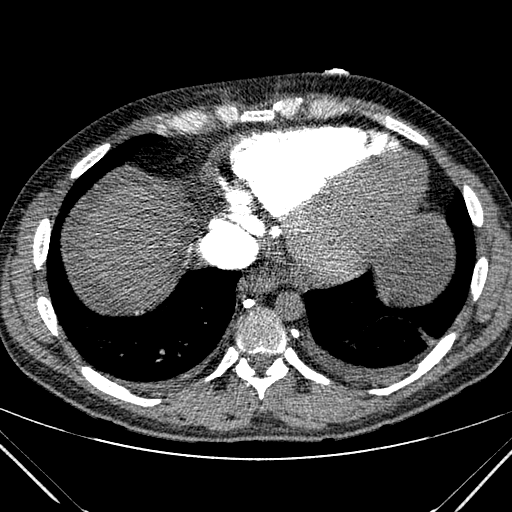
[im 108/280  lung]
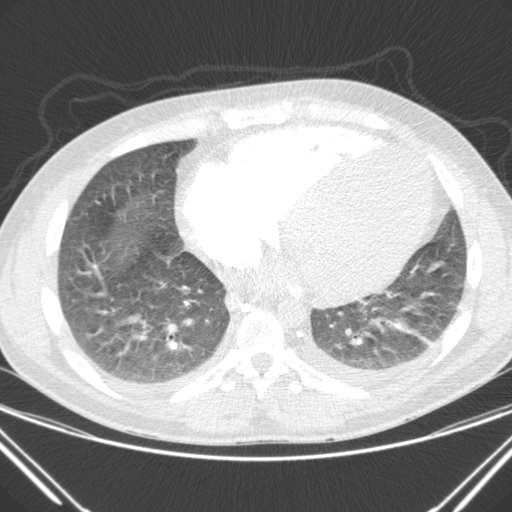
[im 129/280  mediastinal]
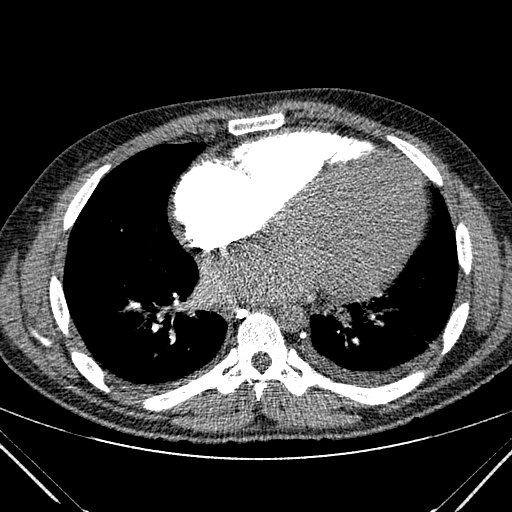
[im 151/280  lung]
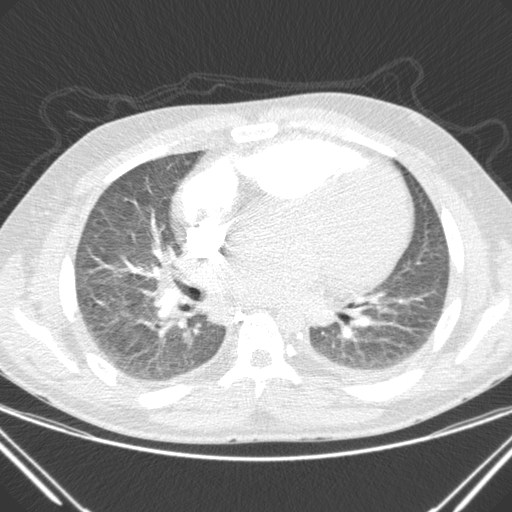
[im 172/280  mediastinal]
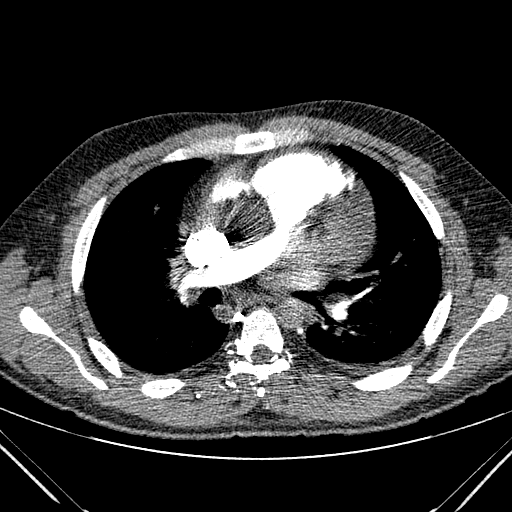
[im 194/280  lung]
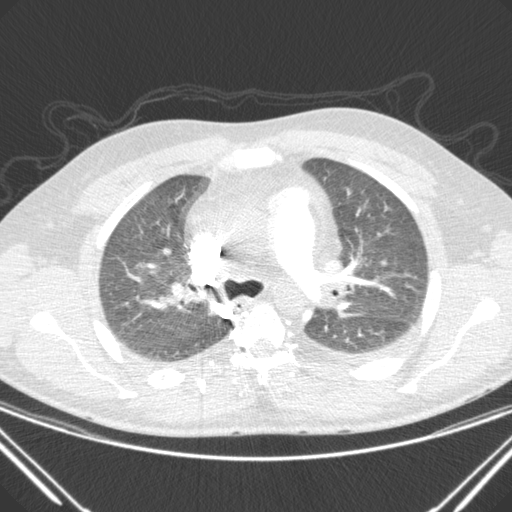
[im 215/280  mediastinal]
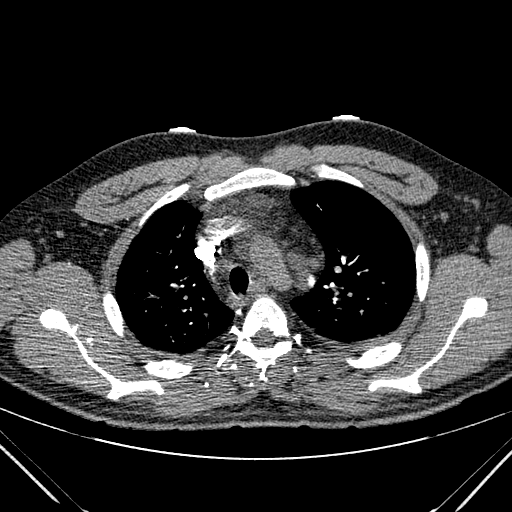
[im 237/280  lung]
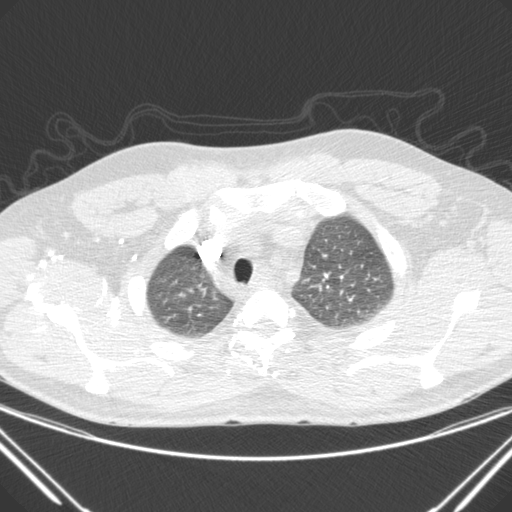
[im 258/280  mediastinal]
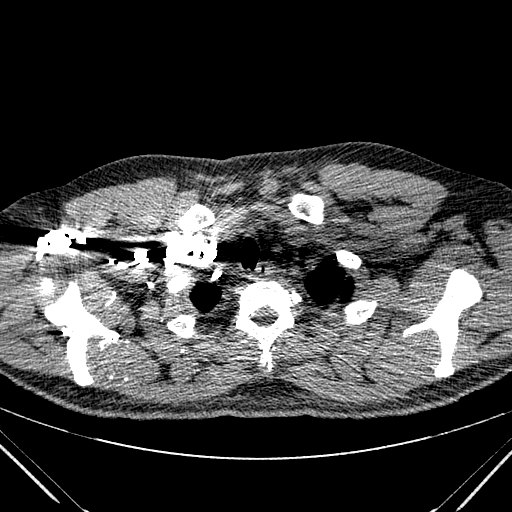

[12 of 36 positions shown; findings below may reference images not displayed]

FINDINGS: Vascular Findings:

There is adequate opacification of the pulmonary arterial system
with the main pulmonary artery measuring 476 Hounsfield units. There
are no discrete filling defects within the pulmonary arterial tree
to the level of the bilateral subsegmental pulmonary arteries.
Evaluation of the distal subsegmental pulmonary arteries is degraded
secondary to patient respiratory artifact. Normal caliber of the
main pulmonary artery.

Marked cardiomegaly. No pericardial effusion though small amount of
fluid is seen tracking to the pericardial recess.

Normal caliber of the thoracic aorta though evaluation is markedly
degraded secondary to a combination of pulsation artifact as well as
streak artifact from contrast within the adjacent pulmonary artery.
Bovine configuration of the aortic arch is suspected. Incidental
note is made of an azygos nipple.

Review of the MIP images confirms the above findings.

----------------------------------------------------------------------------------

Nonvascular Findings:

Mediastinum/Lymph Nodes: Mediastinal and right hilar lymphadenopathy
with index prevascular lymph node measuring 1.2 cm in greatest short
axis diameter (image 28, series 501), index right suprahilar lymph
node measuring 1.5 cm (image 40) and index right infrahilar lymph
node measuring approximately 1.8 cm (image 51). No axillary
lymphadenopathy.

Lungs/Pleura: Evaluation of the pulmonary parenchyma is degraded
secondary to patient respiratory artifact. Trace bilateral
effusions. Minimal dependent subpleural ground-glass atelectasis. No
discrete focal airspace opacities. There is minimal thickening
involving the bilateral upper lobe segmental and subsegmental
bronchi though the main central pulmonary airways remain widely
patent. No discrete pulmonary nodules.

Upper abdomen: Limited early arterial phase evaluation of the upper
abdomen demonstrates reflux of contrast into the intrahepatic venous
system. There is a trace amount perihepatic fluid (image 111, series
501) and fluid seen within the left upper abdominal quadrant (image
87).

Musculoskeletal: No acute or aggressive osseous abnormalities.

Mild diffuse body wall anasarca. Normal appearance of the thyroid
gland.
IMPRESSION: 1. No evidence of pulmonary embolism.
2. Marked cardiomegaly with findings worrisome for pulmonary edema
including trace bilateral effusions, mild diffuse body wall anasarca
and trace amount of fluid seen within the imaged upper abdomen.
Additionally, there is reflux of contrast into the intrahepatic
venous system which could be indicative of right-sided heart
failure. Further evaluation cardiac echo could be performed as
clinically indicated.
3. Mediastinal and hilar adenopathy, nonspecific though potentially
reactive in etiology.

## 2017-12-27 ENCOUNTER — Other Ambulatory Visit (HOSPITAL_COMMUNITY): Payer: Self-pay | Admitting: Internal Medicine

## 2018-02-14 ENCOUNTER — Other Ambulatory Visit (HOSPITAL_COMMUNITY): Payer: Self-pay | Admitting: Internal Medicine

## 2018-02-26 ENCOUNTER — Other Ambulatory Visit (HOSPITAL_COMMUNITY): Payer: Self-pay | Admitting: Internal Medicine

## 2018-03-21 ENCOUNTER — Other Ambulatory Visit (HOSPITAL_COMMUNITY): Payer: Self-pay | Admitting: Internal Medicine

## 2018-04-04 ENCOUNTER — Other Ambulatory Visit (HOSPITAL_COMMUNITY): Payer: Self-pay | Admitting: Internal Medicine

## 2018-05-28 ENCOUNTER — Other Ambulatory Visit (HOSPITAL_COMMUNITY): Payer: Self-pay | Admitting: Internal Medicine

## 2018-06-01 ENCOUNTER — Encounter: Payer: Self-pay | Admitting: Family Medicine

## 2018-06-01 ENCOUNTER — Ambulatory Visit (INDEPENDENT_AMBULATORY_CARE_PROVIDER_SITE_OTHER): Payer: BLUE CROSS/BLUE SHIELD | Admitting: Family Medicine

## 2018-06-01 VITALS — BP 129/87 | HR 78 | Resp 17 | Ht 70.0 in | Wt 221.0 lb

## 2018-06-01 DIAGNOSIS — F410 Panic disorder [episodic paroxysmal anxiety] without agoraphobia: Secondary | ICD-10-CM | POA: Diagnosis not present

## 2018-06-01 DIAGNOSIS — F909 Attention-deficit hyperactivity disorder, unspecified type: Secondary | ICD-10-CM | POA: Diagnosis not present

## 2018-06-01 DIAGNOSIS — K580 Irritable bowel syndrome with diarrhea: Secondary | ICD-10-CM

## 2018-06-01 DIAGNOSIS — F331 Major depressive disorder, recurrent, moderate: Secondary | ICD-10-CM

## 2018-06-01 DIAGNOSIS — F411 Generalized anxiety disorder: Secondary | ICD-10-CM

## 2018-06-01 DIAGNOSIS — K828 Other specified diseases of gallbladder: Secondary | ICD-10-CM

## 2018-06-01 MED ORDER — DICYCLOMINE HCL 10 MG PO CAPS: 10.0000 mg | ORAL_CAPSULE | Freq: Three times a day (TID) | ORAL | 2 refills | Status: DC

## 2018-06-01 MED ORDER — CITALOPRAM HYDROBROMIDE 20 MG PO TABS: 20.0000 mg | ORAL_TABLET | Freq: Every day | ORAL | 1 refills | Status: DC

## 2018-06-01 MED ORDER — HYDROXYZINE HCL 25 MG PO TABS: 25.0000 mg | ORAL_TABLET | Freq: Three times a day (TID) | ORAL | 1 refills | Status: DC | PRN

## 2018-06-01 NOTE — Progress Notes (Deleted)
Patient ID: Austin Tran, male    DOB: 1986-03-28, 33 y.o.   MRN: [id number]  PCP: Bing Neighbors, FNP  Chief Complaint  Patient presents with  . Establish Care  . Annual Exam    Subjective:  HPI Austin Tran is a 33 y.o. male, *nonsmoker-*smoker*-*vapes, or *smokeless tobacco*presents for complete physical exam.  Chronic conditions include: Patient Active Problem List   Diagnosis Date Noted  . Weight gain 09/28/2016  . OSA (obstructive sleep apnea) 01/14/2016  . CHF (congestive heart failure), NYHA class III (HCC) 12/25/2015  . 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 Snoring 12/25/2015  . Pulmonary edema 12/18/2015  . Elevated lactic acid level 12/18/2015  . Near syncope 12/18/2015  . Abdominal pain 12/18/2015  . HLD (hyperlipidemia)   . GERD (gastroesophageal reflux disease)   . Gastroesophageal reflux disease without esophagitis   . Aphasia   . Congestive dilated cardiomyopathy (HCC) 11/22/2015  . Acute CVA (cerebrovascular accident) (HCC) 11/22/2015  . Cerebral infarction (HCC) 11/21/2015  . Attention deficit hyperactivity disorder (ADHD) 07/22/2006      Current home medications include: Prior to Admission medications   Medication Sig Start Date End Date Taking? Authorizing Provider  amLODipine (NORVASC) 2.5 MG tablet TAKE ONE TABLET BY MOUTH DAILY 03/21/18   Bensimhon, Bevelyn Buckles, MD  atorvastatin (LIPITOR) 20 MG tablet TAKE ONE TABLET BY MOUTH DAILY AT 6:00 PM 05/30/18   Bensimhon, Bevelyn Buckles, MD  carvedilol (COREG) 6.25 MG tablet TAKE ONE TABLET BY MOUTH TWICE A DAY 02/14/18   Bensimhon, Bevelyn Buckles, MD  citalopram (CELEXA) 10 MG tablet TAKE ONE TABLET BY MOUTH DAILY 04/04/18   Bensimhon, Bevelyn Buckles, MD  folic acid (FOLVITE) 1 MG tablet Take 1 tablet (1 mg total) by mouth daily. 11/13/16   Graciella Freer, PA-C  furosemide (LASIX) 40 MG tablet TAKE 1 TABLET BY MOUTH DAILY 05/30/18   Bensimhon, Bevelyn Buckles, MD  Multiple Vitamin (MULTIVITAMIN WITH MINERALS) TABS  tablet Take 1 tablet by mouth daily. 11/23/15   Hongalgi, Maximino Greenland, MD  pantoprazole (PROTONIX) 40 MG tablet TAKE ONE TABLET BY MOUTH DAILY 02/28/18   Bensimhon, Bevelyn Buckles, MD  sacubitril-valsartan (ENTRESTO) 97-103 MG Take 1 tablet by mouth 2 (two) times daily. 04/28/17   Bensimhon, Bevelyn Buckles, MD  spironolactone (ALDACTONE) 25 MG tablet TAKE ONE TABLET BY MOUTH DAILY 05/30/18   Bensimhon, Bevelyn Buckles, MD    Health Promotion: Health Screening Current/Overdue:   *Immunizations *PAP or* Serum PSA *Mammogram *Colonscopy or Cologuard Last Dental Exam: Last Eye Exam: *diabetic or *routine . Any visual acuity changes: *Yes or *NO.  Family History  Problem Relation Age of Onset  . Hypertension Mother   . Hypertension Father   . Cancer Maternal Grandmother      No Known Allergies  Social History   Socioeconomic History  . Marital status: Single    Spouse name: Not on file  . Number of children: Not on file  . Years of education: Not on file  . Highest education level: Not on file  Occupational History  . Not on file  Social Needs  . Financial resource strain: Not on file  . Food insecurity:    Worry: Not on file    Inability: Not on file  . Transportation needs:    Medical: Not on file    Non-medical: Not on file  Tobacco Use  . Smoking status: Former Smoker    Types: Cigarettes    Last attempt to quit: 11/23/2015  Years since quitting: 2.5  . Smokeless tobacco: Never Used  Substance and Sexual Activity  . Alcohol use: Yes    Alcohol/week: 12.0 standard drinks    Types: 12 Cans of beer per week    Comment: last had beer 11/23/2015  . Drug use: No  . Sexual activity: Not on file  Lifestyle  . Physical activity:    Days per week: Not on file    Minutes per session: Not on file  . Stress: Not on file  Relationships  . Social connections:    Talks on phone: Not on file    Gets together: Not on file    Attends religious service: Not on file    Active member of club or  organization: Not on file    Attends meetings of clubs or organizations: Not on file    Relationship status: Not on file  . Intimate partner violence:    Fear of current or ex partner: Not on file    Emotionally abused: Not on file    Physically abused: Not on file    Forced sexual activity: Not on file  Other Topics Concern  . Not on file  Social History Narrative  . Not on file       Review of Systems  Past Medical, Surgical Family and Social History reviewed and updated.  Objective:  There were no vitals filed for this visit.  Wt Readings from Last 3 Encounters:  08/05/17 222 lb 12.8 oz (101.1 kg)  04/01/17 231 lb (104.8 kg)  09/28/16 226 lb (102.5 kg)    Physical Exam        {*** HELP TEXT ***  This SmartLink requires parameters. It will display a table listing the base names of the components followed by the results arranged by the time the values were taken. The LabRcnt SmartLink accepts a list of result component base names separated by commas. The base names (i.e. MCH, MCV, HCT) are abbreviations for a result. For example, Red Blood Cell Count might have a base name of RBC.  This allows the user to request specific information by giving the SmartLink appropriate input.   Example:  .LabRcnt[MCH:3,MCV:*,HCT  - To indicate the NUMBER OF RESULTS for each component, type the component name followed by a colon and then the number of results.  (Component name:4).  - For ALL RESULTS for that particular component, type a star in place of the number (Component name:*).  - To obtain THE LAST RESULT for a particular component, type the component base name without the colon, number, or star. Ex: .LabRcnt[RBC  - The Amount of LOOKBACK TIME for the SmartLink to use is set for the facility in the [facility]. This setting is currently equal to 8760 hours   The above example would display a table for all components whose base name matches '[facility]', 'MCV', or 'HCT'. The first would  contain the last three results of each component that has a base name of [facility], the second would contain all the entered results for components with MCV as the base name, and the third would contain the last entered result for all components with a base name of HCT.}     Assessment & Plan:  1. Encounter to establish care ***  2. Annual physical exam *** - CBC with Differential - Comprehensive metabolic panel - Lipid Panel - POCT URINALYSIS DIP (CLINITEK)  3. Lipid screening *** - Lipid Panel  4. Diabetes mellitus screening *** - Hemoglobin A1c  5. Screening for deficiency anemia *** - CBC with Differential  6. Thyroid disorder screen *** - Thyroid Panel With TSH

## 2018-06-01 NOTE — Patient Instructions (Signed)
Thank you for choosing Primary Care at [facility] to be your medical home!    Austin Tran was seen by Joaquin Courts, FNP today.   Delight Hoh primary care provider is Bing Neighbors, FNP.   For the best care possible, you should try to see Joaquin Courts, FNP-C whenever you come to the clinic.   We look forward to seeing you again soon!  If you have any questions about your visit today, please call us at [phone] or feel free to reach your primary care provider via MyChart.    Keeping you healthy  Get these tests  Blood pressure- Have your blood pressure checked once a year by your healthcare provider.  Normal blood pressure is 120/80.  Weight- Have your body mass index (BMI) calculated to screen for obesity.  BMI is a measure of body fat based on height and weight. You can also calculate your own BMI at https://www.west-esparza.com/.  Cholesterol- Have your cholesterol checked regularly starting at age 46, sooner may be necessary if you have diabetes, high blood pressure, if a family member developed heart diseases at an early age or if you smoke.   Chlamydia, HIV, and other sexual transmitted disease- Get screened each year until the age of 79 then within three months of each new sexual partner.  Diabetes- Have your blood sugar checked regularly if you have high blood pressure, high cholesterol, a family history of diabetes or if you are overweight.  Get these vaccines  Flu shot- Every fall.  Tetanus shot- Every 10 years.  Menactra- Single dose; prevents meningitis.  Take these steps  Don't smoke- If you do smoke, ask your healthcare provider about quitting. For tips on how to quit, go to www.smokefree.gov or call 1-800-QUIT-NOW.  Be physically active- Exercise 5 days a week for at least 30 minutes.  If you are not already physically active start slow and gradually work up to 30 minutes of moderate physical activity.  Examples of moderate activity include  walking briskly, mowing the yard, dancing, swimming bicycling, etc.  Eat a healthy diet- Eat a variety of healthy foods such as fruits, vegetables, low fat milk, low fat cheese, yogurt, lean meats, poultry, fish, beans, tofu, etc.  For more information on healthy eating, go to www.thenutritionsource.org  Drink alcohol in moderation- Limit alcohol intake two drinks or less a day.  Never drink and drive.  Dentist- Brush and floss teeth twice daily; visit your dentis twice a year.  Depression-Your emotional health is as important as your physical health.  If you're feeling down, losing interest in things you normally enjoy please talk with your healthcare provider.  Gun Safety- If you keep a gun in your home, keep it unloaded and with the safety lock on.  Bullets should be stored separately.  Helmet use- Always wear a helmet when riding a motorcycle, bicycle, rollerblading or skateboarding.  Safe sex- If you may be exposed to a sexually transmitted infection, use a condom  Seat belts- Seat bels can save your life; always wear one.  Smoke/Carbon Monoxide detectors- These detectors need to be installed on the appropriate level of your home.  Replace batteries at least once a year.  Skin Cancer- When out in the sun, cover up and use sunscreen SPF 15 or higher.  Violence- If anyone is threatening or hurting you, please tell your healthcare provider.

## 2018-06-02 NOTE — Progress Notes (Signed)
Austin Tran, is a 33 y.o. male  LMB:[id number]  FEO:[id number]  DOB - 18-Apr-1986  CC:  Chief Complaint  Patient presents with  . Establish Care  . Annual Exam      HPI: Austin Tran is a 33 y.o. male is here today to establish care. Originally here for a physical, however has multiple other complaints that he wishes to address therefore he will reschedule physical exam.  Austin Tran has Attention deficit hyperactivity disorder (ADHD); Cerebral infarction (HCC); Congestive dilated cardiomyopathy (HCC); Acute CVA (cerebrovascular accident) (HCC); Aphasia; Pulmonary edema; HLD (hyperlipidemia); GERD (gastroesophageal reflux disease); Elevated lactic acid level; Near syncope; Abdominal pain; Gastroesophageal reflux disease without esophagitis; CHF (congestive heart failure), NYHA class III (HCC); Subacute viral endocarditis; Snoring; OSA (obstructive sleep apnea); and Weight gain on their problem list.     Generalized anxiety disorder/ MDD Patient complains of over 2 years of gradually worsening general anxiety.  He reports panic attacks occurring without cause.  Anxiety is currently affecting sleep as he awakens most often every night and is unable to return to sleep given symptoms of panic, heavy breathing, anxiety regarding overall health status.  He reports initially anxiety developed after he suffered from a CVA and was found to have congestive heart failure.  He reports that his level anxiety regarding his health had improved after he was last seen by cardiology and was advised that his overall heart function had returned to baseline.  Now he reports that he just becomes anxious at work which he generally works alone and suffers from worried without cause.  He has a history of depression although has never been evaluated by a psychiatrist although reports he has been on benzodiazepines in the past..  Reports he developed the symptoms in his 31s.  He is currently prescribed Celexa which was initiated  by his cardiologist reports improvement of depression although wishes symptoms were far more controlled.  Denies suicidal ideations, prior suicide attempts, prior behavioral health admissions, auditory hallucinations, homicidal ideations.  Missed a history of heavy alcohol use.  He reports that he continues to drink although is more socially with an average of 10 beers per week.  Denies any illicit drug use.   ADHD Reports a of ADHD.  Previously managed with Adderall. This medication was stopped after he suffered from a CVA and was found to have heart failure.  He is interested in resuming medication as his heart has return to his baseline functioning.  He has been off medication for over 2 years.  Previously prescribed Adderall.  Complains of problems with ability to focus and and pain attention prolonged periods of time.   IBS  Reports a long history of digestive issues.  Reports dating back to middle adolescence he has had frequent diarrheal type stools.  He suffers from diarrhea regardless of food texture, food types, food volume.  He reports not experiencing a solid stool in well over 15 years.  He reports he often holds the desire to pass gas as he fears incontinence.  He has no associated symptoms of nausea or vomiting or any severe abdominal discomfort.  Starting cardiovascular medication the problem has precipitated with the diarrhea.  He is currently on Protonix which he reports taking faithfully.  He has a history of acid reflux which has been controlled with Protonix and denies any associated symptoms of cough, dysphasia, or upper epigastric pain.  No formal evaluation by gastroenterologist.  He has had an abdominal ultrasound which was significant for thickening of the  wall of his gallbladder.  Ultrasound was done back in 2017 when symptoms of diarrhea was also associated with nausea and vomiting.  Patient admits to heavy alcohol intake during that period of time.   Current  medications: Current Outpatient Medications:  .  amLODipine (NORVASC) 2.5 MG tablet, TAKE ONE TABLET BY MOUTH DAILY, Disp: 30 tablet, Rfl: 2 .  atorvastatin (LIPITOR) 20 MG tablet, TAKE ONE TABLET BY MOUTH DAILY AT 6:00 PM, Disp: 30 tablet, Rfl: 2 .  carvedilol (COREG) 6.25 MG tablet, TAKE ONE TABLET BY MOUTH TWICE A DAY, Disp: 60 tablet, Rfl: 3 .  citalopram (CELEXA) 20 MG tablet, Take 1 tablet (20 mg total) by mouth daily., Disp: 90 tablet, Rfl: 1 .  furosemide (LASIX) 40 MG tablet, TAKE 1 TABLET BY MOUTH DAILY, Disp: 30 tablet, Rfl: 2 .  pantoprazole (PROTONIX) 40 MG tablet, TAKE ONE TABLET BY MOUTH DAILY, Disp: 30 tablet, Rfl: 3 .  sacubitril-valsartan (ENTRESTO) 97-103 MG, Take 1 tablet by mouth 2 (two) times daily., Disp: 180 tablet, Rfl: 3 .  spironolactone (ALDACTONE) 25 MG tablet, TAKE ONE TABLET BY MOUTH DAILY, Disp: 30 tablet, Rfl: 2 .  dicyclomine (BENTYL) 10 MG capsule, Take 1 capsule (10 mg total) by mouth 3 (three) times daily before meals., Disp: 90 capsule, Rfl: 2 .  hydrOXYzine (ATARAX/VISTARIL) 25 MG tablet, Take 1-2 tablets (25-50 mg total) by mouth every 8 (eight) hours as needed for itching., Disp: 60 tablet, Rfl: 1   Pertinent family medical history: family history includes Cancer in his maternal grandmother; Hypertension in his father and mother.   No Known Allergies  Social History   Socioeconomic History  . Marital status: Single    Spouse name: Not on file  . Number of children: Not on file  . Years of education: Not on file  . Highest education level: Not on file  Occupational History  . Not on file  Social Needs  . Financial resource strain: Not on file  . Food insecurity:    Worry: Not on file    Inability: Not on file  . Transportation needs:    Medical: Not on file    Non-medical: Not on file  Tobacco Use  . Smoking status: Former Smoker    Types: Cigarettes    Last attempt to quit: 11/23/2015    Years since quitting: 2.5  . Smokeless tobacco:  Never Used  Substance and Sexual Activity  . Alcohol use: Yes    Alcohol/week: 12.0 standard drinks    Types: 12 Cans of beer per week    Comment: last had beer 11/23/2015  . Drug use: No  . Sexual activity: Not on file  Lifestyle  . Physical activity:    Days per week: Not on file    Minutes per session: Not on file  . Stress: Not on file  Relationships  . Social connections:    Talks on phone: Not on file    Gets together: Not on file    Attends religious service: Not on file    Active member of club or organization: Not on file    Attends meetings of clubs or organizations: Not on file    Relationship status: Not on file  . Intimate partner violence:    Fear of current or ex partner: Not on file    Emotionally abused: Not on file    Physically abused: Not on file    Forced sexual activity: Not on file  Other Topics Concern  .  Not on file  Social History Narrative  . Not on file    Review of Systems: Constitutional: Negative for fever, chills, diaphoresis, activity change, appetite change and fatigue. HENT: Negative for ear pain, nosebleeds, congestion, facial swelling, rhinorrhea, neck pain, neck stiffness and ear discharge.  Eyes: Negative for pain, discharge, redness, itching and visual disturbance. Respiratory: Negative for cough, choking, chest tightness, shortness of breath, wheezing and stridor.  Cardiovascular: Negative for chest pain, palpitations and leg swelling. Gastrointestinal: See HPI  Genitourinary: Negative for dysuria, urgency, frequency, hematuria, flank pain, decreased urine volume, difficulty urinating. Musculoskeletal: Negative for back pain, joint swelling, arthralgia and gait problem. Neurological: Negative for dizziness, tremors, seizures, syncope, facial asymmetry, speech difficulty, weakness, light-headedness, numbness and headaches.  Psychiatric/Behavioral: See HPI     Objective:   Vitals:   06/01/18 1109  BP: 129/87  Pulse: 78  Resp:  17  SpO2: 95%    BP Readings from Last 3 Encounters:  06/01/18 129/87  08/05/17 130/84  04/01/17 (!) 141/92    Filed Weights   06/01/18 1109  Weight: 221 lb (100.2 kg)      Physical Exam: Constitutional: Patient appears well-developed and well-nourished. No distress. HENT: Normocephalic, atraumatic, External right and left ear normal. Oropharynx is clear and moist.  Eyes: Conjunctivae and EOM are normal. PERRLA, no scleral icterus. Neck: Normal ROM. Neck supple. No JVD. No tracheal deviation. No thyromegaly. CVS: RRR, S1/S2 +, no murmurs, no gallops, no carotid bruit.  Pulmonary: Effort and breath sounds normal, no stridor, rhonchi, wheezes, rales.  Abdominal: Soft.  Increased abdominal girth, BS +, no distension, tenderness, rebound or guarding.  Musculoskeletal: Normal range of motion. No edema and no tenderness.  Neuro: Alert. Normal muscle tone coordination. Normal gait. BUE and BLE strength 5/5.  Skin: Skin is warm and dry. No rash noted. Not diaphoretic. No erythema. No pallor. Psychiatric: Flat affect, appropriate mood, no obvious signs of anxiousness during encounter Lab Results (prior encounters)  Lab Results  Component Value Date   WBC 9.5 12/31/2015   HGB 17.1 (H) 12/31/2015   HCT 51.6 12/31/2015   MCV 90.5 12/31/2015   PLT 252 12/31/2015   Lab Results  Component Value Date   CREATININE 0.82 07/27/2016   BUN 12 07/27/2016   NA 137 07/27/2016   K 3.6 07/27/2016   CL 103 07/27/2016   CO2 26 07/27/2016    Lab Results  Component Value Date   HGBA1C 5.3 11/22/2015       Component Value Date/Time   CHOL 196 11/22/2015 0816   TRIG 102 11/22/2015 0816   HDL 48 11/22/2015 0816   CHOLHDL 4.1 11/22/2015 0816   VLDL 20 11/22/2015 0816   LDLCALC 128 (H) 11/22/2015 0816        Assessment and plan:  1. Panic disorder Will trial a course of Vistaril 25 to 50 mg up to 3 times daily as needed.  Patient cautioned that medication may cause drowsiness so start  first dose at bedtime. Increase Celexa from 10 mg to 20 mg once daily referred patient to psychiatry for further management and evaluation. - Ambulatory referral to Psychiatry  2. Moderate episode of recurrent major depressive disorder (HCC) Increase Celexa from 10 mg to 20 mg once daily.  Patient has been referred to psychiatry for further management and evaluation. - Ambulatory referral to Psychiatry  3. Irritable bowel syndrome with diarrhea Will trial a course of Bentyl given patient's spasmodic symptoms associated with diarrhea for no known cause.  Will  refer to gastroenterology for further evaluation of effectiveness of Bentyl and additional recommendations for management of IBS with diarrhea. - Ambulatory referral to Gastroenterology  4. Thickening of wall of gallbladder  Patient may benefit from a HIDA scan referring to gastroenterology for further evaluation and management. - Ambulatory referral to Gastroenterology  5. GAD (generalized anxiety disorder) Will trial a course of Vistaril 25 to 50 mg up to 3 times daily as needed.  Patient cautioned that medication may cause drowsiness so start first dose at bedtime. Increase Celexa from 10 mg to 20 mg once daily referred patient to psychiatry for further management and evaluation. - Ambulatory referral to Psychiatry  6.  ADHD, unspecified Stimulant medications are not managed here at current primary care setting.  Patient is being referred to psychiatry for further management and evaluation of ADHD.   Orders Placed This Encounter  Procedures  . Ambulatory referral to Psychiatry  . Ambulatory referral to Gastroenterology    Meds ordered this encounter  Medications  . citalopram (CELEXA) 20 MG tablet    Sig: Take 1 tablet (20 mg total) by mouth daily.    Dispense:  90 tablet    Refill:  1  . hydrOXYzine (ATARAX/VISTARIL) 25 MG tablet    Sig: Take 1-2 tablets (25-50 mg total) by mouth every 8 (eight) hours as needed for  itching.    Dispense:  60 tablet    Refill:  1  . dicyclomine (BENTYL) 10 MG capsule    Sig: Take 1 capsule (10 mg total) by mouth 3 (three) times daily before meals.    Dispense:  90 capsule    Refill:  2    Follow-up and schedule complete physical exam at your convenience.  The patient was given clear instructions to go to ER or return to medical center if symptoms don't improve, worsen or new problems develop. The patient verbalized understanding. The patient was advised  to call and obtain lab results if they haven't heard anything from out office within 7-10 business days.  Joaquin CourtsKimberly Laster Appling, FNP [facility] [address], [place] [id number] [phone]: [phone]    This note has been created with Dragon speech recognition software and Paediatric nursesmart phrase technology. Any transcriptional errors are unintentional.

## 2018-06-14 ENCOUNTER — Ambulatory Visit (INDEPENDENT_AMBULATORY_CARE_PROVIDER_SITE_OTHER): Payer: Self-pay | Admitting: Family Medicine

## 2018-06-22 ENCOUNTER — Encounter: Payer: Self-pay | Admitting: Family Medicine

## 2018-06-22 ENCOUNTER — Ambulatory Visit (INDEPENDENT_AMBULATORY_CARE_PROVIDER_SITE_OTHER): Payer: BLUE CROSS/BLUE SHIELD | Admitting: Family Medicine

## 2018-06-22 VITALS — BP 117/80 | HR 73 | Resp 17 | Ht 70.0 in | Wt 219.4 lb

## 2018-06-22 DIAGNOSIS — Z0000 Encounter for general adult medical examination without abnormal findings: Secondary | ICD-10-CM

## 2018-06-22 DIAGNOSIS — E7841 Elevated Lipoprotein(a): Secondary | ICD-10-CM

## 2018-06-22 DIAGNOSIS — A64 Unspecified sexually transmitted disease: Secondary | ICD-10-CM

## 2018-06-22 DIAGNOSIS — Z1389 Encounter for screening for other disorder: Secondary | ICD-10-CM

## 2018-06-22 DIAGNOSIS — Z130 Encounter for screening for diseases of the blood and blood-forming organs and certain disorders involving the immune mechanism: Secondary | ICD-10-CM

## 2018-06-22 DIAGNOSIS — Z1329 Encounter for screening for other suspected endocrine disorder: Secondary | ICD-10-CM

## 2018-06-22 DIAGNOSIS — Z0001 Encounter for general adult medical examination with abnormal findings: Secondary | ICD-10-CM

## 2018-06-22 DIAGNOSIS — Z131 Encounter for screening for diabetes mellitus: Secondary | ICD-10-CM

## 2018-06-22 LAB — POCT URINALYSIS DIP (CLINITEK)
Bilirubin, UA: NEGATIVE
Glucose, UA: NEGATIVE mg/dL
Ketones, POC UA: NEGATIVE mg/dL
Leukocytes, UA: NEGATIVE
Nitrite, UA: NEGATIVE
POC PROTEIN,UA: 30 — AB
RBC UA: NEGATIVE
SPEC GRAV UA: 1.025 (ref 1.010–1.025)
Urobilinogen, UA: 0.2 E.U./dL
pH, UA: 6 (ref 5.0–8.0)

## 2018-06-22 NOTE — Progress Notes (Signed)
Patient ID: [name], male    DOB: Nov 02, 1985, 33 y.o.   MRN: [id number]  PCP: Bing Neighbors, FNP  Chief Complaint  Patient presents with  . Annual Exam    Subjective:  HPI KRISTOFF CHRONISTER is a 33 y.o. male, former smoker presents for complete physical exam.  [name] has Attention deficit hyperactivity disorder (ADHD); Cerebral infarction (HCC); Congestive dilated cardiomyopathy (HCC); Acute CVA (cerebrovascular accident) (HCC); Aphasia; Pulmonary edema; HLD (hyperlipidemia); GERD (gastroesophageal reflux disease); Elevated lactic acid level; Near syncope; Abdominal pain; Gastroesophageal reflux disease without esophagitis; CHF (congestive heart failure), NYHA class III (HCC); Subacute viral endocarditis; Snoring; OSA (obstructive sleep apnea); and Weight gain on their problem list.   [name] is inactive of routine physical exercise. Making plans to improve health this year and began engaging in routine physical activity by walking initially. He is already made changes to his diet and cut out fast food and trying to incorporate more green type foods into his regular dietary regimen. He identifies an obstacle as his level of beer.  He drinks on average 7-10 beers per week. Current Body mass index is 31.48 kg/m.  He is overdue for a tetanus vaccine which he declines today.  He also declines the influenza vaccine today.,  An appointment with cardiology in 1 week.  He denies any shortness of breath, chest pain or activity intolerance.  He was recently seen in office for anxiety which he reports has improved with current medication regimen.  He was also recently referred to gastroenterology for evaluation of IBS which she reports he has been contacted and only needs to schedule an appointment.  Recently acquired dental insurance and plans to make an appointment for a dental follow-up.  He does not wear glasses and does not receive periodic eye exams. Current home medications  include: Prior to Admission medications   Medication Sig Start Date End Date Taking? Authorizing Provider  amLODipine (NORVASC) 2.5 MG tablet TAKE ONE TABLET BY MOUTH DAILY 03/21/18  Yes Bensimhon, Bevelyn Buckles, MD  atorvastatin (LIPITOR) 20 MG tablet TAKE ONE TABLET BY MOUTH DAILY AT 6:00 PM 05/30/18  Yes Bensimhon, Bevelyn Buckles, MD  carvedilol (COREG) 6.25 MG tablet TAKE ONE TABLET BY MOUTH TWICE A DAY 02/14/18  Yes Bensimhon, Bevelyn Buckles, MD  citalopram (CELEXA) 20 MG tablet Take 1 tablet (20 mg total) by mouth daily. 06/01/18  Yes Bing Neighbors, FNP  dicyclomine (BENTYL) 10 MG capsule Take 1 capsule (10 mg total) by mouth 3 (three) times daily before meals. 06/01/18  Yes Bing Neighbors, FNP  furosemide (LASIX) 40 MG tablet TAKE 1 TABLET BY MOUTH DAILY 05/30/18  Yes Bensimhon, Bevelyn Buckles, MD  hydrOXYzine (ATARAX/VISTARIL) 25 MG tablet Take 1-2 tablets (25-50 mg total) by mouth every 8 (eight) hours as needed for itching. 06/01/18  Yes Bing Neighbors, FNP  pantoprazole (PROTONIX) 40 MG tablet TAKE ONE TABLET BY MOUTH DAILY 02/28/18  Yes Bensimhon, Bevelyn Buckles, MD  sacubitril-valsartan (ENTRESTO) 97-103 MG Take 1 tablet by mouth 2 (two) times daily. 04/28/17  Yes Bensimhon, Bevelyn Buckles, MD  spironolactone (ALDACTONE) 25 MG tablet TAKE ONE TABLET BY MOUTH DAILY 05/30/18  Yes Bensimhon, Bevelyn Buckles, MD    Family History  Problem Relation Age of Onset  . Hypertension Mother   . Hypertension Father   . Cancer Maternal Grandmother      No Known Allergies  Social History   Socioeconomic History  . Marital status: Single  Spouse name: Not on file  . Number of children: Not on file  . Years of education: Not on file  . Highest education level: Not on file  Occupational History  . Not on file  Social Needs  . Financial resource strain: Not on file  . Food insecurity:    Worry: Not on file    Inability: Not on file  . Transportation needs:    Medical: Not on file    Non-medical: Not on file  Tobacco Use   . Smoking status: Former Smoker    Types: Cigarettes    Last attempt to quit: 11/23/2015    Years since quitting: 2.5  . Smokeless tobacco: Never Used  Substance and Sexual Activity  . Alcohol use: Yes    Alcohol/week: 12.0 standard drinks    Types: 12 Cans of beer per week    Comment: last had beer 11/23/2015  . Drug use: No  . Sexual activity: Not on file  Lifestyle  . Physical activity:    Days per week: Not on file    Minutes per session: Not on file  . Stress: Not on file  Relationships  . Social connections:    Talks on phone: Not on file    Gets together: Not on file    Attends religious service: Not on file    Active member of club or organization: Not on file    Attends meetings of clubs or organizations: Not on file    Relationship status: Not on file  . Intimate partner violence:    Fear of current or ex partner: Not on file    Emotionally abused: Not on file    Physically abused: Not on file    Forced sexual activity: Not on file  Other Topics Concern  . Not on file  Social History Narrative  . Not on file    Review of Systems Pertinent negatives listed in HPI Past Medical, Surgical Family and Social History reviewed and updated.  Objective:   Today's Vitals   06/22/18 1059  BP: 117/80  Pulse: 73  Resp: 17  SpO2: 96%  Weight: 219 lb 6.4 oz (99.5 kg)  Height: 5\' 10"  (1.778 m)    Wt Readings from Last 3 Encounters:  06/22/18 219 lb 6.4 oz (99.5 kg)  06/01/18 221 lb (100.2 kg)  08/05/17 222 lb 12.8 oz (101.1 kg)    Physical Exam Constitutional: Patient appears well-developed and well-nourished. No distress. HENT: Normocephalic, atraumatic, External right and left ear normal. Oropharynx is clear and moist.  Eyes: Conjunctivae and EOM are normal. PERRLA, no scleral icterus. Neck: Normal ROM. Neck supple. No JVD. No tracheal deviation. No thyromegaly. CVS: RRR, S1/S2 +, no murmurs, no gallops, no carotid bruit.  Pulmonary: Effort and breath sounds  normal, no stridor, rhonchi, wheezes, rales.  Abdominal: Soft. BS +, no distension, tenderness, rebound or guarding.  Musculoskeletal: Normal range of motion. No edema and no tenderness.  Neuro: Alert. Normal reflexes, muscle tone coordination. No cranial nerve deficit. Skin: Skin is warm and dry. No rash noted. Not diaphoretic. No erythema. No pallor. Psychiatric: Normal mood and affect. Behavior, judgment, thought content normal. Assessment & Plan:  1. Annual physical exam Age-appropriate anticipatory guidance provided  2. Hyperlipidemia Currently on statin therapy, last LDL 128 evaluated in 2017 - Lipid Panel  3. Screening for deficiency anemia - CBC with Differential  4. Thyroid disorder screen - TSH  5. Screening for blood or protein in urine - POCT URINALYSIS DIP (  [facility])  6. Diabetes mellitus screening - Comprehensive metabolic panel - Hemoglobin A1c   Orders Placed This Encounter  Procedures  . GC/Chlamydia Probe Amp([facility])  . CBC with Differential  . Comprehensive metabolic panel  . Hemoglobin A1c  . TSH  . Lipid Panel  . HIV antibody (with reflex)  . RPR  . POCT URINALYSIS DIP ([facility])    Joaquin Courts, FNP Primary Care at [facility] [address], [place] [address] [phone]: [phone]

## 2018-06-22 NOTE — Patient Instructions (Signed)

## 2018-06-23 LAB — COMPREHENSIVE METABOLIC PANEL
ALT: 42 IU/L (ref 0–44)
AST: 27 IU/L (ref 0–40)
Albumin/Globulin Ratio: 1.4 (ref 1.2–2.2)
Albumin: 4.7 g/dL (ref 4.0–5.0)
Alkaline Phosphatase: 95 IU/L (ref 39–117)
BUN/Creatinine Ratio: 10 (ref 9–20)
BUN: 9 mg/dL (ref 6–20)
Bilirubin Total: 0.5 mg/dL (ref 0.0–1.2)
CALCIUM: 9.7 mg/dL (ref 8.7–10.2)
CO2: 21 mmol/L (ref 20–29)
Chloride: 99 mmol/L (ref 96–106)
Creatinine, Ser: 0.92 mg/dL (ref 0.76–1.27)
GFR calc Af Amer: 126 mL/min/{1.73_m2} (ref >59)
GFR calc non Af Amer: 109 mL/min/{1.73_m2} (ref >59)
Globulin, Total: 3.3 g/dL (ref 1.5–4.5)
Glucose: 105 mg/dL — ABNORMAL HIGH (ref 65–99)
Potassium: 3.5 mmol/L (ref 3.5–5.2)
Sodium: 141 mmol/L (ref 134–144)
Total Protein: 8 g/dL (ref 6.0–8.5)

## 2018-06-23 LAB — LIPID PANEL
Chol/HDL Ratio: 3.6 ratio (ref 0.0–5.0)
Cholesterol, Total: 178 mg/dL (ref 100–199)
HDL: 50 mg/dL (ref >39)
LDL Calculated: 90 mg/dL (ref 0–99)
Triglycerides: 188 mg/dL — ABNORMAL HIGH (ref 0–149)
VLDL Cholesterol Cal: 38 mg/dL (ref 5–40)

## 2018-06-23 LAB — CBC WITH DIFFERENTIAL/PLATELET
Basophils Absolute: 0.1 10*3/uL (ref 0.0–0.2)
Basos: 1 %
EOS (ABSOLUTE): 0.2 10*3/uL (ref 0.0–0.4)
Eos: 1 %
Hematocrit: 46.5 % (ref 37.5–51.0)
Hemoglobin: 16 g/dL (ref 13.0–17.7)
IMMATURE GRANULOCYTES: 1 %
Immature Grans (Abs): 0.2 10*3/uL — ABNORMAL HIGH (ref 0.0–0.1)
Lymphocytes Absolute: 2.6 10*3/uL (ref 0.7–3.1)
Lymphs: 17 %
MCH: 29.9 pg (ref 26.6–33.0)
MCHC: 34.4 g/dL (ref 31.5–35.7)
MCV: 87 fL (ref 79–97)
MONOCYTES: 8 %
Monocytes Absolute: 1.2 10*3/uL — ABNORMAL HIGH (ref 0.1–0.9)
Neutrophils Absolute: 11 10*3/uL — ABNORMAL HIGH (ref 1.4–7.0)
Neutrophils: 72 %
PLATELETS: 281 10*3/uL (ref 150–450)
RBC: 5.36 x10E6/uL (ref 4.14–5.80)
RDW: 11.9 % (ref 11.6–15.4)
WBC: 15.2 10*3/uL — ABNORMAL HIGH (ref 3.4–10.8)

## 2018-06-23 LAB — HEMOGLOBIN A1C
Est. average glucose Bld gHb Est-mCnc: 105 mg/dL
Hgb A1c MFr Bld: 5.3 % (ref 4.8–5.6)

## 2018-06-23 LAB — HIV ANTIBODY (ROUTINE TESTING W REFLEX): HIV Screen 4th Generation wRfx: NONREACTIVE

## 2018-06-23 LAB — TSH: TSH: 3.13 u[IU]/mL (ref 0.450–4.500)

## 2018-06-23 LAB — RPR: RPR Ser Ql: NONREACTIVE

## 2018-06-24 LAB — GC/CHLAMYDIA PROBE AMP
Chlamydia trachomatis, NAA: NEGATIVE
Neisseria gonorrhoeae by PCR: NEGATIVE

## 2018-06-27 ENCOUNTER — Telehealth: Payer: Self-pay | Admitting: Family Medicine

## 2018-06-27 ENCOUNTER — Other Ambulatory Visit (HOSPITAL_COMMUNITY): Payer: Self-pay | Admitting: Internal Medicine

## 2018-06-27 NOTE — Telephone Encounter (Signed)
our recent labs results were as follows: Hemoglobin A1C which screens for diabetes is within normal range Thyroid function, liver, kidney, and function were normal.  Cholesterol panel normal with the exception of mildly elevated triglycerides-this is likely due to your heavy consumption of beer which can elevated triglycerides along with intake of high fat and fried foods. Exercise, routine physical activity >150 minutes per week will also improve cholesterol.   STD screening labs including HIV were negative.  Complete blood count indicates a slightly elevated WBC count which occurs during periods of inflammation or if you had a recent illness. If you have felt ill or fatigued, I recommend scheduling lab visit in order for me to repeat CBC.   -Mychart message sent with the same information to patient as well.

## 2018-06-27 NOTE — Telephone Encounter (Signed)
Patient called requesting lab results that he had done on his last office visit. Please f/u

## 2018-06-27 NOTE — Telephone Encounter (Signed)
Please advise 

## 2018-06-29 ENCOUNTER — Ambulatory Visit (HOSPITAL_COMMUNITY)
Admission: RE | Admit: 2018-06-29 | Discharge: 2018-06-29 | Disposition: A | Discharge status: Home / Self Care | Payer: BLUE CROSS/BLUE SHIELD | Source: Ambulatory Visit | Attending: Internal Medicine | Admitting: Internal Medicine

## 2018-06-29 ENCOUNTER — Encounter (HOSPITAL_COMMUNITY): Payer: Self-pay | Admitting: Internal Medicine

## 2018-06-29 VITALS — BP 122/94 | HR 89 | Wt 220.2 lb

## 2018-06-29 DIAGNOSIS — Z79899 Other long term (current) drug therapy: Secondary | ICD-10-CM | POA: Insufficient documentation

## 2018-06-29 DIAGNOSIS — I5022 Chronic systolic (congestive) heart failure: Secondary | ICD-10-CM | POA: Insufficient documentation

## 2018-06-29 DIAGNOSIS — Z8673 Personal history of transient ischemic attack (TIA), and cerebral infarction without residual deficits: Secondary | ICD-10-CM | POA: Insufficient documentation

## 2018-06-29 DIAGNOSIS — G4733 Obstructive sleep apnea (adult) (pediatric): Secondary | ICD-10-CM | POA: Insufficient documentation

## 2018-06-29 DIAGNOSIS — F909 Attention-deficit hyperactivity disorder, unspecified type: Secondary | ICD-10-CM | POA: Insufficient documentation

## 2018-06-29 DIAGNOSIS — K219 Gastro-esophageal reflux disease without esophagitis: Secondary | ICD-10-CM | POA: Diagnosis not present

## 2018-06-29 DIAGNOSIS — Z7901 Long term (current) use of anticoagulants: Secondary | ICD-10-CM | POA: Diagnosis not present

## 2018-06-29 DIAGNOSIS — F419 Anxiety disorder, unspecified: Secondary | ICD-10-CM | POA: Diagnosis not present

## 2018-06-29 DIAGNOSIS — E785 Hyperlipidemia, unspecified: Secondary | ICD-10-CM | POA: Insufficient documentation

## 2018-06-29 DIAGNOSIS — Z87891 Personal history of nicotine dependence: Secondary | ICD-10-CM | POA: Diagnosis not present

## 2018-06-29 DIAGNOSIS — I428 Other cardiomyopathies: Secondary | ICD-10-CM | POA: Insufficient documentation

## 2018-06-29 DIAGNOSIS — I110 Hypertensive heart disease with heart failure: Secondary | ICD-10-CM | POA: Insufficient documentation

## 2018-06-29 DIAGNOSIS — Z8249 Family history of ischemic heart disease and other diseases of the circulatory system: Secondary | ICD-10-CM | POA: Diagnosis not present

## 2018-06-29 MED ORDER — FUROSEMIDE 40 MG PO TABS: 40.0000 mg | ORAL_TABLET | Freq: Every day | ORAL | 2 refills | Status: DC | PRN

## 2018-06-29 MED ORDER — POTASSIUM CHLORIDE CRYS ER 20 MEQ PO TBCR: 20.0000 meq | EXTENDED_RELEASE_TABLET | ORAL | 3 refills | Status: DC | PRN

## 2018-06-29 NOTE — Patient Instructions (Addendum)
CHANGE Furosemide 40mg  (1 tab) a day as needed.  START Potassium (2 tabs) today only. Then take with furosemide when furosemide is needed.   Your physician has requested that you have an echocardiogram. Echocardiography is a painless test that uses sound waves to create images of your heart. It provides your doctor with information about the size and shape of your heart and how well your heart's chambers and valves are working. This procedure takes approximately one hour. There are no restrictions for this procedure.  Follow up with Dr. Gala Romney in 6 months

## 2018-06-29 NOTE — Addendum Note (Signed)
Encounter addended by: Nicole Cella, RN on: 06/29/2018 3:40 PM  Actions taken: Order list changed, Diagnosis association updated, Clinical Note Signed

## 2018-06-29 NOTE — Progress Notes (Signed)
Advanced Heart Failure Clinic Note    Primary Care: Dr Wylene Simmerisovec Primary Cardiologist: Eden EmmsNishan Primary HF: Dr. 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 Drinks 4-5 beers a week.   In June 2017, cardioembolic stroke. Echocardiogram obtained on 11/21/2015 showed EF 20-25%, severe dilatation of the left ventricle, mild MR, moderate left atrial enlargement, moderate RV enlargement with reduced RV function. Cardiology was consulted for TEE in the setting of stroke and also LV dysfunction. TEE obtained on 11/22/2015 showed severe LV dysfunction with EF 15%, severe RV dysfunction.  Echo 12/17/15 showed EF dropped from 20-25% to 10% over 1 month. 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   Initial Coox 31% consistent with severe cardiogenic shock. Started on milrinone 0.375 mcg with coox up to 74%. BB stopped. Brisk diuresis with IV lasix after addition of inotropes. Milrinone weaned and medications adjusted as tolerated. Losartan switched to Entresto. He was successfully weaned off milrinone completely and remained stable symptomatically for discharge. Overall he diuresed 12.2 L and down 15 lbs from admission.    cMRI 12/19/15 1. Severe LV dilation with EF 16%, diffuse hypokinesis. 2. Mild RV dilation, moderately decreased RV systolic function. 3. LGE in the basal to mid inferoseptal RV insertion site. This is a nonspecific finding and often suggests volume overload/LV strain   Echo 03/25/16  EF 25-30% (up from 15%)  Echo 4/18: EF 45-50%  Echo 07/2017: EF 55-60% (Personally reviewed by Dr Gala RomneyBensimhon)  He presents today for regular follow up. Feeling ok. Still struggling with anxiety. Denies SOB, orthopnea on PND. Working at [employer]. Very fatigued and poor motivation. No LE edema. Weight stable. Still with GI issues    Past Medical History:    Diagnosis Date  . ADHD (attention deficit hyperactivity disorder)    on adderall since 2004  . Chronic systolic (congestive) heart failure (HCC)   . GERD (gastroesophageal reflux disease)   . HLD (hyperlipidemia)   . Hypertension   . Stroke (HCC)   . Tobacco abuse     Current Outpatient Medications  Medication Sig Dispense Refill  . amLODipine (NORVASC) 2.5 MG tablet TAKE ONE TABLET BY MOUTH DAILY 30 tablet 1  . atorvastatin (LIPITOR) 20 MG tablet TAKE ONE TABLET BY MOUTH DAILY AT 6:00 PM 30 tablet 2  . carvedilol (COREG) 6.25 MG tablet TAKE ONE TABLET BY MOUTH TWICE A DAY 60 tablet 3  . citalopram (CELEXA) 20 MG tablet Take 1 tablet (20 mg total) by mouth daily. 90 tablet 1  . dicyclomine (BENTYL) 10 MG capsule Take 1 capsule (10 mg total) by mouth 3 (three) times daily before meals. 90 capsule 2  . furosemide (LASIX) 40 MG tablet TAKE 1 TABLET BY MOUTH DAILY 30 tablet 2  . hydrOXYzine (ATARAX/VISTARIL) 25 MG tablet Take 1-2 tablets (25-50 mg total) by mouth every 8 (eight) hours as needed for itching. 60 tablet 1  . pantoprazole (PROTONIX) 40 MG tablet TAKE ONE TABLET BY MOUTH DAILY 30 tablet 2  . sacubitril-valsartan (ENTRESTO) 97-103 MG Take 1 tablet by mouth 2 (two) times daily. 180 tablet 3  . spironolactone (ALDACTONE) 25 MG tablet TAKE ONE TABLET BY MOUTH DAILY 30 tablet 2   No current facility-administered medications for this encounter.     No Known Allergies    Social History   Socioeconomic History  . Marital status:  Single    Spouse name: Not on file  . Number of children: Not on file  . Years of education: Not on file  . Highest education level: Not on file  Occupational History  . Not on file  Social Needs  . Financial resource strain: Not on file  . Food insecurity:    Worry: Not on file    Inability: Not on file  . Transportation needs:    Medical: Not on file    Non-medical: Not on file  Tobacco Use  . Smoking status: Former Smoker    Types:  Cigarettes    Last attempt to quit: 11/23/2015    Years since quitting: 2.6  . Smokeless tobacco: Never Used  Substance and Sexual Activity  . Alcohol use: Yes    Alcohol/week: 5.0 - 10.0 standard drinks    Types: 5 - 10 Cans of beer per week    Comment: beers per week   . Drug use: No  . Sexual activity: Not on file  Lifestyle  . Physical activity:    Days per week: Not on file    Minutes per session: Not on file  . Stress: Not on file  Relationships  . Social connections:    Talks on phone: Not on file    Gets together: Not on file    Attends religious service: Not on file    Active member of club or organization: Not on file    Attends meetings of clubs or organizations: Not on file    Relationship status: Not on file  . Intimate partner violence:    Fear of current or ex partner: Not on file    Emotionally abused: Not on file    Physically abused: Not on file    Forced sexual activity: Not on file  Other Topics Concern  . Not on file  Social History Narrative  . Not on file      Family History  Problem Relation Age of Onset  . Hypertension Mother   . Hypertension Father   . Other Maternal Grandmother        multiple conditions     Vitals:   06/29/18 1439  BP: (!) 122/94  Pulse: 89  SpO2: 96%  Weight: 99.9 kg (220 lb 3.2 oz)   Wt Readings from Last 3 Encounters:  06/29/18 99.9 kg (220 lb 3.2 oz)  06/22/18 99.5 kg (219 lb 6.4 oz)  06/01/18 100.2 kg (221 lb)    PHYSICAL EXAM: General:  Well appearing. No resp difficulty HEENT: normal Neck: supple. no JVD. Carotids 2+ bilat; no bruits. No lymphadenopathy or thryomegaly appreciated. Cor: PMI nondisplaced. Regular rate & rhythm. No rubs, gallops or murmurs. Lungs: clear Abdomen: soft, nontender, nondistended. No hepatosplenomegaly. No bruits or masses. Good bowel sounds. Extremities: no cyanosis, clubbing, rash, edema Neuro: alert & orientedx3, cranial nerves grossly intact. moves all 4 extremities w/o  difficulty. Affect pleasant   ASSESSMENT & PLAN:  1. 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 EF 25-30% on echo 03/25/16 - Echo 3/19 EF 55-60%. - Doing well from cardiac standpoint  - Volume status looks good.  - Continue Entresto to 97/103 bid.  - Continue carvedilol to 6.25 bid  - Continue spiro 25 mg daily. - Can switch lasix to PRN - K 3.5  Take kcl 40 meq today and then take 20 meq any time he takes lasix 2. H/o cardioembolic stroke 10/2015 on Xarelto - Off Xarelto with normalization of  EF. No change 3. ADHD/Anxiety - Off all stimulants. Affect flat.  - Continue Celexa 10 mg daily.  4. Former Smoker  - Denies tobacco use 5. OSA on CPAP - Continue CPAP each evening 6. HTN - Blood pressure well controlled. Continue current regimen.  Follow up 6 months with echo   Austin Meresaniel Raylin Winer, MD 3:21 PM

## 2018-07-01 NOTE — Progress Notes (Signed)
Good Morning   They contact me to let me know that  He has an appointment   07-13-18 @ 4:15pm  With Dr Jannifer Franklin

## 2018-07-13 ENCOUNTER — Other Ambulatory Visit (HOSPITAL_COMMUNITY): Payer: Self-pay | Admitting: Internal Medicine

## 2018-07-18 ENCOUNTER — Other Ambulatory Visit (HOSPITAL_COMMUNITY): Payer: Self-pay | Admitting: *Deleted

## 2018-07-18 MED ORDER — SACUBITRIL-VALSARTAN 97-103 MG PO TABS: 1.0000 | ORAL_TABLET | Freq: Two times a day (BID) | ORAL | 3 refills | Status: DC

## 2018-08-03 ENCOUNTER — Other Ambulatory Visit: Payer: Self-pay | Admitting: Registered Nurse

## 2018-08-03 ENCOUNTER — Encounter (HOSPITAL_COMMUNITY): Payer: Self-pay | Admitting: *Deleted

## 2018-08-03 ENCOUNTER — Other Ambulatory Visit: Payer: Self-pay

## 2018-08-03 ENCOUNTER — Emergency Department (HOSPITAL_COMMUNITY)
Admission: EM | Admit: 2018-08-03 | Discharge: 2018-08-03 | Disposition: A | Discharge status: Other Acute Inpatient Hospital | Payer: BLUE CROSS/BLUE SHIELD | Source: Home / Self Care | Attending: Emergency Medicine | Admitting: Emergency Medicine

## 2018-08-03 ENCOUNTER — Encounter (HOSPITAL_COMMUNITY): Payer: Self-pay | Admitting: Emergency Medicine

## 2018-08-03 ENCOUNTER — Inpatient Hospital Stay (HOSPITAL_COMMUNITY)
Admission: AD | Admit: 2018-08-03 | Discharge: 2018-08-05 | DRG: 885 | Disposition: A | Discharge status: Home / Self Care | Payer: BLUE CROSS/BLUE SHIELD | Source: Intra-hospital | Attending: Psychiatry | Admitting: Psychiatry

## 2018-08-03 DIAGNOSIS — I110 Hypertensive heart disease with heart failure: Secondary | ICD-10-CM | POA: Diagnosis present

## 2018-08-03 DIAGNOSIS — F909 Attention-deficit hyperactivity disorder, unspecified type: Secondary | ICD-10-CM

## 2018-08-03 DIAGNOSIS — Z8673 Personal history of transient ischemic attack (TIA), and cerebral infarction without residual deficits: Secondary | ICD-10-CM

## 2018-08-03 DIAGNOSIS — E785 Hyperlipidemia, unspecified: Secondary | ICD-10-CM | POA: Diagnosis present

## 2018-08-03 DIAGNOSIS — Z818 Family history of other mental and behavioral disorders: Secondary | ICD-10-CM | POA: Diagnosis not present

## 2018-08-03 DIAGNOSIS — K219 Gastro-esophageal reflux disease without esophagitis: Secondary | ICD-10-CM | POA: Diagnosis present

## 2018-08-03 DIAGNOSIS — F1099 Alcohol use, unspecified with unspecified alcohol-induced disorder: Secondary | ICD-10-CM | POA: Insufficient documentation

## 2018-08-03 DIAGNOSIS — I5022 Chronic systolic (congestive) heart failure: Secondary | ICD-10-CM

## 2018-08-03 DIAGNOSIS — F329 Major depressive disorder, single episode, unspecified: Secondary | ICD-10-CM

## 2018-08-03 DIAGNOSIS — G479 Sleep disorder, unspecified: Secondary | ICD-10-CM | POA: Diagnosis present

## 2018-08-03 DIAGNOSIS — R45851 Suicidal ideations: Secondary | ICD-10-CM | POA: Insufficient documentation

## 2018-08-03 DIAGNOSIS — F410 Panic disorder [episodic paroxysmal anxiety] without agoraphobia: Secondary | ICD-10-CM | POA: Diagnosis present

## 2018-08-03 DIAGNOSIS — Z79899 Other long term (current) drug therapy: Secondary | ICD-10-CM

## 2018-08-03 DIAGNOSIS — I429 Cardiomyopathy, unspecified: Secondary | ICD-10-CM | POA: Diagnosis present

## 2018-08-03 DIAGNOSIS — F322 Major depressive disorder, single episode, severe without psychotic features: Secondary | ICD-10-CM | POA: Diagnosis not present

## 2018-08-03 DIAGNOSIS — Z87891 Personal history of nicotine dependence: Secondary | ICD-10-CM

## 2018-08-03 DIAGNOSIS — F1010 Alcohol abuse, uncomplicated: Secondary | ICD-10-CM | POA: Diagnosis present

## 2018-08-03 DIAGNOSIS — Z8249 Family history of ischemic heart disease and other diseases of the circulatory system: Secondary | ICD-10-CM

## 2018-08-03 LAB — BASIC METABOLIC PANEL
Anion gap: 14 (ref 5–15)
BUN: 7 mg/dL (ref 6–20)
CO2: 21 mmol/L — ABNORMAL LOW (ref 22–32)
Calcium: 9.2 mg/dL (ref 8.9–10.3)
Chloride: 103 mmol/L (ref 98–111)
Creatinine, Ser: 0.89 mg/dL (ref 0.61–1.24)
GFR calc Af Amer: >60 mL/min (ref >60)
GFR calc non Af Amer: >60 mL/min (ref >60)
Glucose, Bld: 94 mg/dL (ref 70–99)
Potassium: 3.7 mmol/L (ref 3.5–5.1)
Sodium: 138 mmol/L (ref 135–145)

## 2018-08-03 LAB — URINALYSIS, ROUTINE W REFLEX MICROSCOPIC
Bilirubin Urine: NEGATIVE
Glucose, UA: NEGATIVE mg/dL
Hgb urine dipstick: NEGATIVE
KETONES UR: NEGATIVE mg/dL
Leukocytes,Ua: NEGATIVE
Nitrite: NEGATIVE
PH: 6 (ref 5.0–8.0)
Protein, ur: NEGATIVE mg/dL
Specific Gravity, Urine: 1.004 — ABNORMAL LOW (ref 1.005–1.030)

## 2018-08-03 LAB — CBC WITH DIFFERENTIAL/PLATELET
Abs Immature Granulocytes: 0.16 10*3/uL — ABNORMAL HIGH (ref 0.00–0.07)
Basophils Absolute: 0.1 10*3/uL (ref 0.0–0.1)
Basophils Relative: 1 %
Eosinophils Absolute: 0.2 10*3/uL (ref 0.0–0.5)
Eosinophils Relative: 2 %
HCT: 48.4 % (ref 39.0–52.0)
Hemoglobin: 15.7 g/dL (ref 13.0–17.0)
Immature Granulocytes: 2 %
Lymphocytes Relative: 39 %
Lymphs Abs: 3.7 10*3/uL (ref 0.7–4.0)
MCH: 28.5 pg (ref 26.0–34.0)
MCHC: 32.4 g/dL (ref 30.0–36.0)
MCV: 88 fL (ref 80.0–100.0)
Monocytes Absolute: 0.8 10*3/uL (ref 0.1–1.0)
Monocytes Relative: 8 %
Neutro Abs: 4.6 10*3/uL (ref 1.7–7.7)
Neutrophils Relative %: 48 %
Platelets: 294 10*3/uL (ref 150–400)
RBC: 5.5 MIL/uL (ref 4.22–5.81)
RDW: 11.9 % (ref 11.5–15.5)
WBC: 9.6 10*3/uL (ref 4.0–10.5)
nRBC: 0 % (ref 0.0–0.2)

## 2018-08-03 LAB — RAPID URINE DRUG SCREEN, HOSP PERFORMED
Amphetamines: NOT DETECTED
BENZODIAZEPINES: NOT DETECTED
Barbiturates: NOT DETECTED
Cocaine: NOT DETECTED
Opiates: NOT DETECTED
Tetrahydrocannabinol: NOT DETECTED

## 2018-08-03 LAB — ETHANOL: Alcohol, Ethyl (B): 213 mg/dL — ABNORMAL HIGH (ref <10)

## 2018-08-03 MED ORDER — LORAZEPAM 1 MG PO TABS
1.0000 mg | ORAL_TABLET | Freq: Three times a day (TID) | ORAL | Status: DC
  Administered 2018-08-05: 1 mg via ORAL
  Filled 2018-08-03: qty 1

## 2018-08-03 MED ORDER — VITAMIN B-1 100 MG PO TABS
100.0000 mg | ORAL_TABLET | Freq: Every day | ORAL | Status: DC
  Administered 2018-08-04 – 2018-08-05 (×2): 100 mg via ORAL
  Filled 2018-08-03 (×4): qty 1

## 2018-08-03 MED ORDER — THIAMINE HCL 100 MG/ML IJ SOLN
100.0000 mg | Freq: Once | INTRAMUSCULAR | Status: AC
  Administered 2018-08-03: 100 mg via INTRAMUSCULAR
  Filled 2018-08-03: qty 2

## 2018-08-03 MED ORDER — CITALOPRAM HYDROBROMIDE 20 MG PO TABS
20.0000 mg | ORAL_TABLET | Freq: Every day | ORAL | Status: DC
  Filled 2018-08-03 (×5): qty 1

## 2018-08-03 MED ORDER — CARVEDILOL 6.25 MG PO TABS
6.2500 mg | ORAL_TABLET | Freq: Two times a day (BID) | ORAL | Status: DC
  Administered 2018-08-03 – 2018-08-05 (×4): 6.25 mg via ORAL
  Filled 2018-08-03 (×8): qty 1
  Filled 2018-08-03: qty 2

## 2018-08-03 MED ORDER — ONDANSETRON 4 MG PO TBDP: 4.0000 mg | ORAL_TABLET | Freq: Four times a day (QID) | ORAL | Status: DC | PRN

## 2018-08-03 MED ORDER — LORAZEPAM 1 MG PO TABS
2.0000 mg | ORAL_TABLET | Freq: Once | ORAL | Status: AC
  Administered 2018-08-03: 2 mg via ORAL
  Filled 2018-08-03: qty 2

## 2018-08-03 MED ORDER — ADULT MULTIVITAMIN W/MINERALS CH
1.0000 | ORAL_TABLET | Freq: Every day | ORAL | Status: DC
  Administered 2018-08-04 – 2018-08-05 (×2): 1 via ORAL
  Filled 2018-08-03 (×6): qty 1

## 2018-08-03 MED ORDER — LORAZEPAM 1 MG PO TABS
1.0000 mg | ORAL_TABLET | Freq: Four times a day (QID) | ORAL | Status: AC
  Administered 2018-08-03 – 2018-08-04 (×6): 1 mg via ORAL
  Filled 2018-08-03 (×6): qty 1

## 2018-08-03 MED ORDER — LORAZEPAM 1 MG PO TABS
1.0000 mg | ORAL_TABLET | Freq: Four times a day (QID) | ORAL | Status: DC | PRN
  Administered 2018-08-05: 1 mg via ORAL
  Filled 2018-08-03: qty 1

## 2018-08-03 MED ORDER — LORAZEPAM 1 MG PO TABS: 1.0000 mg | ORAL_TABLET | Freq: Two times a day (BID) | ORAL | Status: DC

## 2018-08-03 MED ORDER — LORAZEPAM 1 MG PO TABS: 1.0000 mg | ORAL_TABLET | Freq: Every day | ORAL | Status: DC

## 2018-08-03 MED ORDER — DICYCLOMINE HCL 10 MG PO CAPS
10.0000 mg | ORAL_CAPSULE | Freq: Three times a day (TID) | ORAL | Status: DC
  Administered 2018-08-03 – 2018-08-05 (×5): 10 mg via ORAL
  Filled 2018-08-03 (×17): qty 1

## 2018-08-03 MED ORDER — POTASSIUM CHLORIDE CRYS ER 20 MEQ PO TBCR
20.0000 meq | EXTENDED_RELEASE_TABLET | Freq: Every day | ORAL | Status: DC | PRN
  Filled 2018-08-03: qty 1

## 2018-08-03 MED ORDER — LOPERAMIDE HCL 2 MG PO CAPS: 2.0000 mg | ORAL_CAPSULE | ORAL | Status: DC | PRN

## 2018-08-03 MED ORDER — SACUBITRIL-VALSARTAN 97-103 MG PO TABS
1.0000 | ORAL_TABLET | Freq: Two times a day (BID) | ORAL | Status: DC
  Administered 2018-08-03 – 2018-08-05 (×4): 1 via ORAL
  Filled 2018-08-03 (×10): qty 1

## 2018-08-03 MED ORDER — SPIRONOLACTONE 25 MG PO TABS
25.0000 mg | ORAL_TABLET | Freq: Every day | ORAL | Status: DC
  Administered 2018-08-03 – 2018-08-05 (×3): 25 mg via ORAL
  Filled 2018-08-03 (×6): qty 1

## 2018-08-03 MED ORDER — FUROSEMIDE 40 MG PO TABS
40.0000 mg | ORAL_TABLET | ORAL | Status: DC | PRN
  Filled 2018-08-03: qty 1

## 2018-08-03 MED ORDER — AMLODIPINE BESYLATE 2.5 MG PO TABS
2.5000 mg | ORAL_TABLET | Freq: Every day | ORAL | Status: DC
  Administered 2018-08-03 – 2018-08-05 (×3): 2.5 mg via ORAL
  Filled 2018-08-03 (×5): qty 1

## 2018-08-03 MED ORDER — PANTOPRAZOLE SODIUM 40 MG PO TBEC
40.0000 mg | DELAYED_RELEASE_TABLET | Freq: Every day | ORAL | Status: DC
  Administered 2018-08-03 – 2018-08-05 (×3): 40 mg via ORAL
  Filled 2018-08-03 (×6): qty 1

## 2018-08-03 MED ORDER — ATORVASTATIN CALCIUM 20 MG PO TABS
20.0000 mg | ORAL_TABLET | Freq: Every day | ORAL | Status: DC
  Administered 2018-08-03 – 2018-08-04 (×2): 20 mg via ORAL
  Filled 2018-08-03: qty 2
  Filled 2018-08-03 (×4): qty 1
  Filled 2018-08-03: qty 2

## 2018-08-03 MED ORDER — HYDROXYZINE HCL 25 MG PO TABS
25.0000 mg | ORAL_TABLET | Freq: Four times a day (QID) | ORAL | Status: DC | PRN
  Administered 2018-08-03 – 2018-08-04 (×2): 25 mg via ORAL
  Filled 2018-08-03 (×2): qty 1

## 2018-08-03 MED ORDER — AMLODIPINE BESYLATE 5 MG PO TABS
ORAL_TABLET | ORAL | Status: AC
  Filled 2018-08-03: qty 1

## 2018-08-03 NOTE — ED Notes (Signed)
Pt given belongings and valuables from security. Signed inventory sheets.

## 2018-08-03 NOTE — Tx Team (Signed)
Initial Treatment Plan 08/03/2018 3:37 PM DOUGAL PAOLILLO ZYY:[id number]    PATIENT STRESSORS: Health problems Medication change or noncompliance   PATIENT STRENGTHS: Ability for insight Average or above average intelligence Capable of independent living General fund of knowledge Motivation for treatment/growth   PATIENT IDENTIFIED PROBLEMS: Depression Suicidal thoughts "I feel like I'm on borrowed time"                     DISCHARGE CRITERIA:  Ability to meet basic life and health needs Improved stabilization in mood, thinking, and/or behavior Reduction of life-threatening or endangering symptoms to within safe limits Verbal commitment to aftercare and medication compliance  PRELIMINARY DISCHARGE PLAN: Attend aftercare/continuing care group Return to previous living arrangement  PATIENT/FAMILY INVOLVEMENT: This treatment plan has been presented to and reviewed with the patient, Austin Tran, and/or family member, .  The patient and family have been given the opportunity to ask questions and make suggestions.  Lyne Khurana, [place], [place] 08/03/2018, 3:37 PM

## 2018-08-03 NOTE — BHH Counselor (Signed)
Spoke with Pt's parents, who are en route from [facility].  They stated that they are very concerned for Pt's safety, and they said they do not feel confident that they could keep Pt safe if discharged.  Consulted with S. Rankin, NP, who determined that Pt meets inpatient criteria.  TTS will begin looking for placement.

## 2018-08-03 NOTE — ED Notes (Signed)
Report given to [facility]

## 2018-08-03 NOTE — ED Notes (Signed)
Pt dad called and said he was on the way here but is 8 hrs away, he is concerned pt will check himself out

## 2018-08-03 NOTE — ED Triage Notes (Signed)
Pt friend, Darnelle Catalan, [phone].  Pt called his friend to tell him he was thinking of ending his life.  Pt told him he had written notes to his friends and family but did not revel a plan.

## 2018-08-03 NOTE — BH Assessment (Signed)
Tele Assessment Note   Patient Name: Austin Tran MRN: [id number] Referring Physician: Joellyn Rued Location of Patient: [facility] Location of Provider: Behavioral Health TTS Department  Austin Tran is an 33 y.o. male who presented to [facility] on voluntary basis with complaint of suicidal ideation, depression, and other symptoms.  Pt has not been assessed by TTS before.  Pt lives alone in [place], and he is currently unemployed.  Pt stated that he began seeing Dr. Jannifer Franklin last month for treatment of depressive symptoms.  Pt was transported to the ED by EMS after Pt's friend called hospital/police out of concern for him.    Pt provided history.  Three years ago, Pt suffered a stroke due to congestive heart failure.  Since then, he has experienced significant despondency.  Recently, he has felt suicidal, and last night, while he was journaling, he began to write a suicide note.  Pt then called his friend in a tearful state and told friend he was thinking about ending his life.  Pt endorsed the following symptoms:  Suicidal ideation as of last night (Pt denied suicidal ideation at time of assessment); persistent and unremitting despondency; insomnia; feelings of hopelessness; anxiety; fatigue; isolation; poor concentration.  Pt also endorsed alcohol use -- 3-4 beers per night, 3-4 x per week.   Pt reported that he started seeing Dr. Jannifer Franklin in February 2020, and he has another appointment with Dr. Jannifer Franklin next week.  Pt denied any past psychiatric care.  When asked what he wanted to do, Pt stated, ''I don't know.  I just want to feel better.''  During assessment, Pt presented as alert and oriented.  He had good eye contact and was cooperative.  Pt was dressed in scrubs, and he appeared appropriately groomed.  Pt's mood was depressed, and affect was mood congruent.  Pt endorsed recent suicidal ideation and intent (no stated plan); persistent despondency, other depressive symptoms, and alcohol use.  Pt denied  hallucination, homicidal ideation, and self-injurious behavior.  Speech was normal in rate, rhythm, and volume.  Thought processes were within normal range, and thought content was logical and goal-oriented.  There was no evidence of delusion.  Pt's memory and concentration were intact.  Insight, judgment, and impulse control were fair.   Attempted to gather collateral from Pt's father, Austin Tran -- left a message.  Consulted with S. Rankin, NP who determined that TTS is to gather collateral before final disposition can be made.  Awaiting call-back from father.  Diagnosis: Major Depressive Disorder, Single Ep., Severe w/o psychotic features; Alcohol Use Disorder  Past Medical History:  Past Medical History:  Diagnosis Date  . ADHD (attention deficit hyperactivity disorder)    on adderall since 2004  . Chronic systolic (congestive) heart failure (HCC)   . GERD (gastroesophageal reflux disease)   . HLD (hyperlipidemia)   . Hypertension   . Stroke (HCC)   . Tobacco abuse     Past Surgical History:  Procedure Laterality Date  . EYE SURGERY    . KNEE SURGERY    . TEE WITHOUT CARDIOVERSION N/A 11/22/2015   Procedure: TRANSESOPHAGEAL ECHOCARDIOGRAM (TEE);  Surgeon: Lewayne Bunting, MD;  Location: [facility] ENDOSCOPY;  Service: Cardiovascular;  Laterality: N/A;    Family History:  Family History  Problem Relation Age of Onset  . Hypertension Mother   . Hypertension Father   . Other Maternal Grandmother        multiple conditions     Social History:  reports that he quit  smoking about 2 years ago. His smoking use included cigarettes. He has never used smokeless tobacco. He reports current alcohol use of about 3.0 standard drinks of alcohol per week. He reports that he does not use drugs.  Additional Social History:  Alcohol / Drug Use Pain Medications: See MAR Prescriptions: See MAR Over the Counter: See MAR History of alcohol / drug use?: Yes Substance #1 Name of Substance 1:  Alcohol (beer) 1 - Age of First Use: 20 1 - Amount (size/oz): 3-4 beers 1 - Frequency: 3-4x a week 1 - Duration: Ongoing 1 - Last Use / Amount: 08/02/2018  CIWA:   COWS:    Allergies: No Known Allergies  Home Medications: (Not in a hospital admission)   OB/GYN Status:  No LMP for male patient.  General Assessment Data Assessment unable to be completed: Yes Reason for not completing assessment: multiple assessments Location of Assessment: [facility] ED TTS Assessment: In system Is this a Tele or Face-to-Face Assessment?: Tele Assessment Is this an Initial Assessment or a Re-assessment for this encounter?: Initial Assessment Patient Accompanied by:: N/A Language Other than English: No Living Arrangements: Other (Comment)(Lives alone) What gender do you identify as?: Male Marital status: Single Pregnancy Status: No Living Arrangements: Alone Can pt return to current living arrangement?: Yes Admission Status: Voluntary Is patient capable of signing voluntary admission?: Yes Referral Source: Self/Family/Friend Insurance type: BCBS     Crisis Care Plan Living Arrangements: Alone Name of Psychiatrist: Dr. Jannifer Franklin Name of Therapist: None  Education Status Is patient currently in school?: No Is the patient employed, unemployed or receiving disability?: Unemployed  Risk to self with the past 6 months Suicidal Ideation: No-Not Currently/Within Last 6 Months(Experienced last night) Has patient been a risk to self within the past 6 months prior to admission? : Yes Suicidal Intent: No-Not Currently/Within Last 6 Months Has patient had any suicidal intent within the past 6 months prior to admission? : Yes Is patient at risk for suicide?: Yes Suicidal Plan?: No Has patient had any suicidal plan within the past 6 months prior to admission? : No Access to Means: No What has been your use of drugs/alcohol within the last 12 months?: Alcohol Previous Attempts/Gestures: No Intentional  Self Injurious Behavior: None Family Suicide History: Unknown Recent stressful life event(s): Recent negative physical changes(Unemployed; change in meds; recovering from stroke) Persecutory voices/beliefs?: No Depression: Yes Depression Symptoms: Despondent, Insomnia, Tearfulness, Isolating, Guilt, Feeling worthless/self pity, Fatigue, Loss of interest in usual pleasures Substance abuse history and/or treatment for substance abuse?: Yes Suicide prevention information given to non-admitted patients: Not applicable  Risk to Others within the past 6 months Homicidal Ideation: No Does patient have any lifetime risk of violence toward others beyond the six months prior to admission? : No Thoughts of Harm to Others: No Current Homicidal Intent: No Current Homicidal Plan: No Access to Homicidal Means: No History of harm to others?: No Assessment of Violence: None Noted Does patient have access to weapons?: No Criminal Charges Pending?: No Does patient have a court date: No Is patient on probation?: No  Psychosis Hallucinations: None noted Delusions: None noted  Mental Status Report Appearance/Hygiene: In scrubs, Unremarkable Eye Contact: Good Motor Activity: Freedom of movement, Unremarkable Speech: Logical/coherent, Unremarkable Level of Consciousness: Alert Mood: Depressed Affect: Appropriate to circumstance Anxiety Level: None Thought Processes: Coherent, Relevant Judgement: Partial Orientation: Person, Place, Time, Situation Obsessive Compulsive Thoughts/Behaviors: None  Cognitive Functioning Concentration: Normal Memory: Recent Intact, Remote Intact Is patient IDD: No Insight: Fair Impulse  Control: Fair Appetite: Good Have you had any weight changes? : No Change Sleep: Decreased Vegetative Symptoms: None  ADLScreening [facility] Assessment Services) Patient's cognitive ability adequate to safely complete daily activities?: Yes Patient able to express need for assistance  with ADLs?: Yes Independently performs ADLs?: Yes (appropriate for developmental age)  Prior Inpatient Therapy Prior Inpatient Therapy: No  Prior Outpatient Therapy Prior Outpatient Therapy: Yes Prior Therapy Dates: Currently(Beginning in February 2020) Prior Therapy Facilty/Provider(s): Dr. Jannifer Franklin Reason for Treatment: Depression Does patient have an ACCT team?: No Does patient have Intensive In-House Services?  : No Does patient have [facility] services? : No Does patient have P4CC services?: No  ADL Screening (condition at time of admission) Patient's cognitive ability adequate to safely complete daily activities?: Yes Is the patient deaf or have difficulty hearing?: No Does the patient have difficulty seeing, even when wearing glasses/contacts?: No Does the patient have difficulty concentrating, remembering, or making decisions?: No Patient able to express need for assistance with ADLs?: Yes Does the patient have difficulty dressing or bathing?: No Independently performs ADLs?: Yes (appropriate for developmental age) Does the patient have difficulty walking or climbing stairs?: No Weakness of Legs: None Weakness of Arms/Hands: None  Home Assistive Devices/Equipment Home Assistive Devices/Equipment: None  Therapy Consults (therapy consults require a physician order) PT Evaluation Needed: No OT Evalulation Needed: No SLP Evaluation Needed: No Abuse/Neglect Assessment (Assessment to be complete while patient is alone) Abuse/Neglect Assessment Can Be Completed: Yes Physical Abuse: Denies Verbal Abuse: Denies Sexual Abuse: Denies Exploitation of patient/patient's resources: Denies Self-Neglect: Denies Values / Beliefs Cultural Requests During Hospitalization: None Spiritual Requests During Hospitalization: None Consults Spiritual Care Consult Needed: No Social Work Consult Needed: No Merchant navy officer (For Healthcare) Does Patient Have a Medical Advance Directive?:  No          Disposition:  Disposition Initial Assessment Completed for this Encounter: Yes Patient referred to: Other (Comment)(Final disposition awaiting collateral)  This service was provided via telemedicine using a 2-way, interactive audio and video technology.  Names of all persons participating in this telemedicine service and their role in this encounter. Name: Austin Tran, Angeli Role: Pt             Earline Mayotte 08/03/2018 10:34 AM

## 2018-08-03 NOTE — ED Notes (Signed)
Lunch tray ordered 

## 2018-08-03 NOTE — ED Notes (Signed)
[facility] called for transport. 

## 2018-08-03 NOTE — ED Notes (Signed)
Pt friend, Nedra Hai bedside

## 2018-08-03 NOTE — Progress Notes (Signed)
Austin Tran is a 33 year old male pt admitted on voluntary basis. On admission, he reports that he has been feeling depressed and suicidal and reports that he told a friend who then brought him into the hospital. On admission he does endorse depression and passive SI but able to contract for safety while in the hospital. He spoke about how he had a stroke and how it changed his life and feels that he is on borrowed time now. He reports that he was taking celexa but recently has started seeing Dr. Jannifer Franklin who was weaning him down and also started him on wellbutrin but reports that he has noticed mood changes since the switch. He does endorse regular alcohol consumption but denies any other substance abuse issues. He reports that he has been taking all his medications. He reports that he lives alone and will be returning to the same situation when he gets discharged. Michall was cooperative during admission process, was oriented to the milieu and safety maintained.

## 2018-08-03 NOTE — ED Provider Notes (Signed)
[facility] EMERGENCY DEPARTMENT Provider Note   CSN: [id number] Arrival date & time: 08/03/18  4696    History   Chief Complaint Chief Complaint  Patient presents with  . Suicidal    HPI Austin Tran is a 33 y.o. male.     Patient is a 33 year old male with history of ADHD, dilated cardiomyopathy, CHF, and prior CVA.  He presents today with complaints of depression, suicidal ideation.  These feelings have been coming and going for several weeks, however became worse last night when he "fell into a dark place".  He called his friend and informed him he was considering ending his life.  Patient reports recent medication changes and medical issues as stressors.  Patient does admit to occasional alcohol use, however denies illicit drug use.  The history is provided by the patient.    Past Medical History:  Diagnosis Date  . ADHD (attention deficit hyperactivity disorder)    on adderall since 2004  . Chronic systolic (congestive) heart failure (HCC)   . GERD (gastroesophageal reflux disease)   . HLD (hyperlipidemia)   . Hypertension   . Stroke (HCC)   . Tobacco abuse     Patient Active Problem List   Diagnosis Date Noted  . Weight gain 09/28/2016  . OSA (obstructive sleep apnea) 01/14/2016  . CHF (congestive heart failure), NYHA class III (HCC) 12/25/2015  . 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 Snoring 12/25/2015  . Pulmonary edema 12/18/2015  . Elevated lactic acid level 12/18/2015  . Near syncope 12/18/2015  . Abdominal pain 12/18/2015  . HLD (hyperlipidemia)   . GERD (gastroesophageal reflux disease)   . Gastroesophageal reflux disease without esophagitis   . Aphasia   . Congestive dilated cardiomyopathy (HCC) 11/22/2015  . Acute CVA (cerebrovascular accident) (HCC) 11/22/2015  . Cerebral infarction (HCC) 11/21/2015  . Attention deficit hyperactivity disorder (ADHD) 07/22/2006    Past Surgical History:  Procedure Laterality Date  .  EYE SURGERY    . KNEE SURGERY    . TEE WITHOUT CARDIOVERSION N/A 11/22/2015   Procedure: TRANSESOPHAGEAL ECHOCARDIOGRAM (TEE);  Surgeon: Lewayne Bunting, MD;  Location: [facility] ENDOSCOPY;  Service: Cardiovascular;  Laterality: N/A;        Home Medications    Prior to Admission medications   Medication Sig Start Date End Date Taking? Authorizing Provider  amLODipine (NORVASC) 2.5 MG tablet TAKE ONE TABLET BY MOUTH DAILY 06/27/18   Bensimhon, Bevelyn Buckles, MD  atorvastatin (LIPITOR) 20 MG tablet TAKE ONE TABLET BY MOUTH DAILY AT 6:00 PM 05/30/18   Bensimhon, Bevelyn Buckles, MD  carvedilol (COREG) 6.25 MG tablet TAKE ONE TABLET BY MOUTH TWICE A DAY 07/13/18   Bensimhon, Bevelyn Buckles, MD  citalopram (CELEXA) 20 MG tablet Take 1 tablet (20 mg total) by mouth daily. 06/01/18   Bing Neighbors, FNP  dicyclomine (BENTYL) 10 MG capsule Take 1 capsule (10 mg total) by mouth 3 (three) times daily before meals. 06/01/18   Bing Neighbors, FNP  furosemide (LASIX) 40 MG tablet Take 1 tablet (40 mg total) by mouth daily as needed. 06/29/18   Bensimhon, Bevelyn Buckles, MD  hydrOXYzine (ATARAX/VISTARIL) 25 MG tablet Take 1-2 tablets (25-50 mg total) by mouth every 8 (eight) hours as needed for itching. 06/01/18   Bing Neighbors, FNP  pantoprazole (PROTONIX) 40 MG tablet TAKE ONE TABLET BY MOUTH DAILY 06/27/18   Bensimhon, Bevelyn Buckles, MD  potassium chloride SA (K-DUR,KLOR-CON) 20 MEQ tablet Take 1 tablet (20 mEq total)  by mouth as needed. Take only when taking lasix 06/29/18   Bensimhon, Bevelyn Buckles, MD  sacubitril-valsartan (ENTRESTO) 97-103 MG Take 1 tablet by mouth 2 (two) times daily. 07/18/18   Bensimhon, Bevelyn Buckles, MD  spironolactone (ALDACTONE) 25 MG tablet TAKE ONE TABLET BY MOUTH DAILY 05/30/18   Bensimhon, Bevelyn Buckles, MD    Family History Family History  Problem Relation Age of Onset  . Hypertension Mother   . Hypertension Father   . Other Maternal Grandmother        multiple conditions     Social History Social History    Tobacco Use  . Smoking status: Former Smoker    Types: Cigarettes    Last attempt to quit: 11/23/2015    Years since quitting: 2.6  . Smokeless tobacco: Never Used  Substance Use Topics  . Alcohol use: Yes    Alcohol/week: 5.0 - 10.0 standard drinks    Types: 5 - 10 Cans of beer per week    Comment: beers per week   . Drug use: No     Allergies   Patient has no known allergies.   Review of Systems Review of Systems  All other systems reviewed and are negative.    Physical Exam Updated Vital Signs There were no vitals taken for this visit.  Physical Exam Vitals signs and nursing note reviewed.  Constitutional:      General: He is not in acute distress.    Appearance: He is well-developed. He is not diaphoretic.  HENT:     Head: Normocephalic and atraumatic.  Neck:     Musculoskeletal: Normal range of motion and neck supple.  Cardiovascular:     Rate and Rhythm: Normal rate and regular rhythm.     Heart sounds: No murmur. No friction rub.  Pulmonary:     Effort: Pulmonary effort is normal. No respiratory distress.     Breath sounds: Normal breath sounds. No wheezing or rales.  Abdominal:     General: Bowel sounds are normal. There is no distension.     Palpations: Abdomen is soft.     Tenderness: There is no abdominal tenderness.  Musculoskeletal: Normal range of motion.  Skin:    General: Skin is warm and dry.  Neurological:     Mental Status: He is alert and oriented to person, place, and time.     Coordination: Coordination normal.  Psychiatric:        Attention and Perception: Attention and perception normal.        Mood and Affect: Mood and affect normal.        Speech: Speech normal.        Behavior: Behavior is cooperative.        Thought Content: Thought content includes suicidal ideation. Thought content does not include suicidal plan.        Cognition and Memory: Cognition normal.        Judgment: Judgment is impulsive.      ED Treatments /  Results  Labs (all labs ordered are listed, but only abnormal results are displayed) Labs Reviewed  BASIC METABOLIC PANEL  CBC WITH DIFFERENTIAL/PLATELET  ETHANOL  URINALYSIS, ROUTINE W REFLEX MICROSCOPIC  RAPID URINE DRUG SCREEN, HOSP PERFORMED    EKG None  Radiology No results found.  Procedures Procedures (including critical care time)  Medications Ordered in ED Medications - No data to display   Initial Impression / Assessment and Plan / ED Course  I have reviewed the triage vital signs  and the nursing notes.  Pertinent labs & imaging results that were available during my care of the patient were reviewed by me and considered in my medical decision making (see chart for details).  Patient presenting here complaining of suicidal ideation.  Patient was seen by TTS who is currently assisting in the disposition.  Final Clinical Impressions(s) / ED Diagnoses   Final diagnoses:  None    ED Discharge Orders    None       Geoffery Lyons, MD 08/03/18 1226

## 2018-08-03 NOTE — Progress Notes (Signed)
Pt accepted to [facility], Bed 307-1  Shuvon Rankin, NP is the accepting provider.  Landry Mellow, MD is the attending provider.  Call report to [phone]  @ [facility] ED notified.   Pt is Voluntary.  Pt may be transported by [facility] Pt scheduled  to arrive at [facility] @13 :30  Carney Bern T. Kaylyn Lim, MSW, LCSWA Disposition Clinical Social Work [phone] (cell) [phone] (office)

## 2018-08-04 DIAGNOSIS — F1010 Alcohol abuse, uncomplicated: Secondary | ICD-10-CM

## 2018-08-04 DIAGNOSIS — F322 Major depressive disorder, single episode, severe without psychotic features: Principal | ICD-10-CM

## 2018-08-04 DIAGNOSIS — R45851 Suicidal ideations: Secondary | ICD-10-CM

## 2018-08-04 DIAGNOSIS — Z87891 Personal history of nicotine dependence: Secondary | ICD-10-CM

## 2018-08-04 LAB — BASIC METABOLIC PANEL
Anion gap: 11 (ref 5–15)
BUN: 10 mg/dL (ref 6–20)
CALCIUM: 9 mg/dL (ref 8.9–10.3)
CO2: 25 mmol/L (ref 22–32)
Chloride: 99 mmol/L (ref 98–111)
Creatinine, Ser: 0.86 mg/dL (ref 0.61–1.24)
GFR calc Af Amer: >60 mL/min (ref >60)
Glucose, Bld: 92 mg/dL (ref 70–99)
Potassium: 3 mmol/L — ABNORMAL LOW (ref 3.5–5.1)
Sodium: 135 mmol/L (ref 135–145)

## 2018-08-04 LAB — TSH: TSH: 3.107 u[IU]/mL (ref 0.350–4.500)

## 2018-08-04 LAB — BRAIN NATRIURETIC PEPTIDE: B NATRIURETIC PEPTIDE 5: 19.5 pg/mL (ref 0.0–100.0)

## 2018-08-04 MED ORDER — POTASSIUM CHLORIDE CRYS ER 20 MEQ PO TBCR
20.0000 meq | EXTENDED_RELEASE_TABLET | Freq: Once | ORAL | Status: AC
  Administered 2018-08-04: 20 meq via ORAL
  Filled 2018-08-04 (×2): qty 1

## 2018-08-04 MED ORDER — SERTRALINE HCL 25 MG PO TABS
25.0000 mg | ORAL_TABLET | Freq: Every day | ORAL | Status: DC
  Administered 2018-08-04 – 2018-08-05 (×2): 25 mg via ORAL
  Filled 2018-08-04 (×4): qty 1

## 2018-08-04 NOTE — Progress Notes (Signed)
[facility] Group Notes:  (Nursing/MHT/Case Management/Adjunct)  Date:  08/04/2018  Time:  2045 Type of Therapy:  wrap up group  Participation Level:  Active  Participation Quality:  Appropriate, Attentive and Sharing  Affect:  Blunted and Flat  Cognitive:  Appropriate  Insight:  Improving  Engagement in Group:  Engaged  Modes of Intervention:  Clarification, Education and Support  Summary of Progress/Problems: Pt reports that he slept less today and attributes that to lessening depression. If patient could change any one thing about his life it would be suffering from his stroke and heart failure. Pt is grateful for his family and reported having a good visit with his friends and family today.   Marcille Buffy 08/04/2018, 10:28 PM

## 2018-08-04 NOTE — [facility] Suicide Risk Assessment (Signed)
[facility] Admission Suicide Risk Assessment   Nursing information obtained from:  Patient Demographic factors:  Male, Adolescent or young adult, Caucasian, Living alone, Unemployed Current Mental Status:  Self-harm thoughts Loss Factors:  Decline in physical health Historical Factors:  NA Risk Reduction Factors:  Positive coping skills or problem solving skills  Total Time spent with patient: 30 minutes Principal Problem: <principal problem not specified> Diagnosis:  Active Problems:   MDD (major depressive disorder), severe (HCC)  Subjective Data: Patient is seen and examined.  Patient is a 33 year old male who presented to the [facility] emergency department on voluntary basis with a complaint of suicidal ideation, depression.  The patient lives in [place] with his parents, and is currently unemployed.  The patient stated he began seeing Dr. Jannifer Franklin last month for treatment of depression.  He stated that he had been recently changed from Celexa to Wellbutrin.  He stated he was having discontinuation problems from the Celexa and having "brain shocks".  He also stated that he was not tolerating the Wellbutrin well.  Patient stated that he had called a friend in a tearful state, and was thinking about harming himself.  His parents were out of town.  The patient also endorsed alcohol use with 3-4 beers per night, and drinking 3-4 times a week.  He stated "I just drink beer".  His blood alcohol on admission was 213.  Evaluators last night contacted his parents, and stated that they were very concerned for the patient's safety.  They did not feel confident that he would be safe by himself.  The patient also has a significant past medical history for congestive heart failure and status post CVA.  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  He had previously had an ejection fraction of somewhere between 20 and 25%.  He stated the last time he saw this  cardiologist his ejection fracture had improved to approximately 40 to 50%.  He denied any previous psychiatric hospitalizations.  He denied any previous rehabilitation stays or detox stays.  He was admitted to the hospital for evaluation and stabilization.  Continued Clinical Symptoms:  Alcohol Use Disorder Identification Test Final Score (AUDIT): 7 The "Alcohol Use Disorders Identification Test", Guidelines for Use in Primary Care, Second Edition.  World Science writer [facility]). Score between 0-7:  no or low risk or alcohol related problems. Score between 8-15:  moderate risk of alcohol related problems. Score between 16-19:  high risk of alcohol related problems. Score 20 or above:  warrants further diagnostic evaluation for alcohol dependence and treatment.   CLINICAL FACTORS:   Depression:   Anhedonia Comorbid alcohol abuse/dependence Hopelessness Impulsivity Insomnia Alcohol/Substance Abuse/Dependencies   Musculoskeletal: Strength & Muscle Tone: within normal limits Gait & Station: normal Patient leans: N/A  Psychiatric Specialty Exam: Physical Exam  Nursing note and vitals reviewed. Constitutional: He is oriented to person, place, and time. He appears well-developed and well-nourished.  HENT:  Head: Normocephalic and atraumatic.  Respiratory: Effort normal.  Neurological: He is alert and oriented to person, place, and time.    ROS  Blood pressure (!) 136/103, pulse 90, temperature 98.1 F (36.7 C), temperature source Oral, resp. rate 18, height 5\' 9"  (1.753 m), weight 98 kg.Body mass index is 31.9 kg/m.  General Appearance: Casual  Eye Contact:  Fair  Speech:  Normal Rate  Volume:  Normal  Mood:  Anxious  Affect:  Constricted  Thought Process:  Coherent and Descriptions of Associations: Intact  Orientation:  Full (  Time, Place, and Person)  Thought Content:  Logical  Suicidal Thoughts:  No  Homicidal Thoughts:  No  Memory:  Immediate;   Fair Recent;    Fair Remote;   Fair  Judgement:  Intact  Insight:  Fair  Psychomotor Activity:  Normal  Concentration:  Concentration: Fair and Attention Span: Fair  Recall:  [employer] of Knowledge:  Fair  Language:  Good  Akathisia:  Negative  Handed:  Right  AIMS (if indicated):     Assets:  Communication Skills Desire for Improvement Housing Leisure Time Resilience Social Support  ADL's:  Intact  Cognition:  WNL  Sleep:  Number of Hours: 5.75      COGNITIVE FEATURES THAT CONTRIBUTE TO RISK:  None    SUICIDE RISK:   Minimal: No identifiable suicidal ideation.  Patients presenting with no risk factors but with morbid ruminations; may be classified as minimal risk based on the severity of the depressive symptoms  PLAN OF CARE: Patient is seen and examined.  Patient is a 33 year old male with the above-stated past psychiatric history who was admitted after he called a friend, stated he was suicidal, and was brought to the emergency room.  He has an outpatient psychiatrist who recently switched him from Celexa to Wellbutrin.  He is not tolerated this well.  It also sounds as though the Celexa was not effective at that time.  His Celexa be stopped, and he will be started on Zoloft 25 mg p.o. daily and this to be titrated during the course the hospitalization.  Because of his alcohol issues I am going to put lorazepam on board for a detox.  Given his health issues I think it is important that we make sure that he is safe from any withdrawal symptoms.  He stated that longest period of time that he had gone without any alcohol in the last 90 days was 3 days.  He denied any critical withdrawal symptoms, but again with his underlying illness it is quite concerning.  He also has well-controlled congestive heart failure, and we will continue his amlodipine, atorvastatin, carvedilol, Lasix, Protonix, spironolactone, Entresto, and thiamine and folic acid with regard to his alcohol.  He will be admitted to the  psychiatric unit.  He will be integrated into the milieu.  He will be encouraged to attend groups.  We will collect collateral information from his family members.  His laboratories on admission were reviewed.  His BNP is excellent at 19.5.  The rest of his chemistries are stable except for a low potassium at 3.0.  This will be supplemented and rechecked.  His CBC was normal.  For whatever reason his liver function enzymes were not checked, and that will be ordered when we repeat his potassium.  Of note, he is already signed a 72-hour letter to be released.  I certify that inpatient services furnished can reasonably be expected to improve the patient's condition.   Antonieta Pert, MD 08/04/2018, 11:28 AM

## 2018-08-04 NOTE — Progress Notes (Signed)
D. Pt presents with a flat affect/ anxious mood-calm, cooperative behavior- denies withdrawal symptoms- observed interacting appropriately with peers and staff in the milieu. Per pt's self inventory, pt rates his depression, hopelessness and anxiety a 5/1/4, respectively. Pt writes that his most important goal today is "great through the day and try to feel better than the day before". Pt currently denies SI/HI and AVH and agrees to contact staff before acting on any harmful thoughts.  A. Labs and vitals monitored. Pt compliant with medications. Pt supported emotionally and encouraged to express concerns and ask questions.   R. Pt remains safe with 15 minute checks. Will continue POC.

## 2018-08-04 NOTE — [facility] Group Notes (Signed)
LCSW Group Therapy Note  08/04/2018 4:00 PM  Type of Therapy/Topic: Group Therapy: Feelings about Diagnosis  Participation Level: Minimal   Description of Group:  This group will allow patients to explore their thoughts and feelings about diagnoses they have received. Patients will be guided to explore their level of understanding and acceptance of these diagnoses. Facilitator will encourage patients to process their thoughts and feelings about the reactions of others to their diagnosis and will guide patients in identifying ways to discuss their diagnosis with significant others in their lives. This group will be process-oriented, with patients participating in exploration of their own experiences, giving and receiving support, and processing challenge from other group members.  Therapeutic Goals: 1. Patient will demonstrate understanding of diagnosis as evidenced by identifying two or more symptoms of the disorder 2. Patient will be able to express two feelings regarding the diagnosis 3. Patient will demonstrate their ability to communicate their needs through discussion and/or role play  Summary of Patient Progress: Patient sat and listened attentively throughout group. He did not contribute to the group discussion and later shared with CSW he did not contribute because he feels guilty for having such an extended support system.   Therapeutic Modalities:  Cognitive Behavioral Therapy Brief Therapy Feelings Identification   Enid Cutter, MSW, [facility] Clinical Social Worker

## 2018-08-04 NOTE — Progress Notes (Signed)
Pt said he do not need to attend wrap up group AA.

## 2018-08-04 NOTE — Plan of Care (Signed)
D: Patient is at the nurses station on approach. Patient is pleasant and cooperative. Denies SI, HI, AVH, and verbally contracts for safety. Reports he is tired. Patient denies physical symptoms/pain.    A: Medications administered per MD order. Support provided. Patient educated on safety on the unit and medications. Routine safety checks every 15 minutes. Patient stated understanding to tell nurse about any new physical symptoms. Patient understands to tell staff of any needs.     R: No adverse drug reactions noted. Patient verbally contracts for safety. Patient remains safe at this time and will continue to monitor.   Problem: Education: Goal: Knowledge of [place] [facility] Education information/materials will improve Outcome: Progressing   Problem: Safety: Goal: Periods of time without injury will increase Outcome: Progressing   Patient oriented to the unit. Patient remains safe and will continue to monitor.

## 2018-08-04 NOTE — Progress Notes (Signed)
Julis attended wrap-up group tonight. Pt appears anxious/irritable in affect and mood. Pt denies SI/HI/AVH/Pain at this time. Pt appears preoccupied with getting his medications right. Pt observe pacing the hallway often. CPAP by bedside; will collect in a.m. Pt cautious with interaction. Support offered. Will continue with POC.

## 2018-08-04 NOTE — H&P (Signed)
Psychiatric Admission Assessment Adult  Patient Identification: Austin Tran MRN:  [id number] Date of Evaluation:  08/04/2018 Chief Complaint:  MDD ALCOHOL USE DISORDER Principal Diagnosis: MDD (major depressive disorder), severe (HCC) Diagnosis:  Principal Problem:   MDD (major depressive disorder), severe (HCC) Active Problems:   Alcohol abuse  History of Present Illness: Per MD's admission SRA: Patient is a 33 year old male who presented to the [facility] emergency department on voluntary basis with a complaint of suicidal ideation, depression.  The patient lives in [place] with his parents, and is currently unemployed.  The patient stated he began seeing Dr. Jannifer Franklin last month for treatment of depression.  He stated that he had been recently changed from Celexa to Wellbutrin.  He stated he was having discontinuation problems from the Celexa and having "brain shocks".  He also stated that he was not tolerating the Wellbutrin well.  Patient stated that he had called a friend in a tearful state, and was thinking about harming himself.  His parents were out of town.  The patient also endorsed alcohol use with 3-4 beers per night, and drinking 3-4 times a week.  He stated "I just drink beer".  His blood alcohol on admission was 213.  Evaluators last night contacted his parents, and stated that they were very concerned for the patient's safety.  They did not feel confident that he would be safe by himself.  The patient also has a significant past medical history for congestive heart failure and status post CVA.  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  He had previously had an ejection fraction of somewhere between 20 and 25%.  He stated the last time he saw this cardiologist his ejection fracture had improved to approximately 40 to 50%.  He denied any previous psychiatric hospitalizations.  He denied any previous rehabilitation stays or detox  stays.  He was admitted to the hospital for evaluation and stabilization.  Associated Signs/Symptoms: Depression Symptoms:  depressed mood, anhedonia, fatigue, suicidal thoughts without plan, anxiety, panic attacks, disturbed sleep, (Hypo) Manic Symptoms:  Irritable Mood, Anxiety Symptoms:  Excessive Worry, Psychotic Symptoms:  denies PTSD Symptoms: Negative Total Time spent with patient: 45 minutes  Past Psychiatric History: Intermittent depression since diagnosed with CHF, cardiomyopathy and stroke three years ago. Currently seen by Dr. Jannifer Franklin for medication management- weaned off Celexa one week ago and started Wellbutrin. Denies history of hospitalizations, rehab, or suicide attempts. Denies history of manic or psychotic symptoms.  Is the patient at risk to self? Yes.    Has the patient been a risk to self in the past 6 months? No.  Has the patient been a risk to self within the distant past? No.  Is the patient a risk to others? No.  Has the patient been a risk to others in the past 6 months? No.  Has the patient been a risk to others within the distant past? No.   Prior Inpatient Therapy:   Prior Outpatient Therapy:    Alcohol Screening: 1. How often do you have a drink containing alcohol?: 2 to 3 times a week 2. How many drinks containing alcohol do you have on a typical day when you are drinking?: 3 or 4 3. How often do you have six or more drinks on one occasion?: Weekly AUDIT-C Score: 7 4. How often during the last year have you found that you were not able to stop drinking once you had started?: Never 5. How often during the  last year have you failed to do what was normally expected from you becasue of drinking?: Never 6. How often during the last year have you needed a first drink in the morning to get yourself going after a heavy drinking session?: Never 7. How often during the last year have you had a feeling of guilt of remorse after drinking?: Never 8. How often  during the last year have you been unable to remember what happened the night before because you had been drinking?: Never 9. Have you or someone else been injured as a result of your drinking?: No 10. Has a relative or friend or a doctor or another health worker been concerned about your drinking or suggested you cut down?: No Alcohol Use Disorder Identification Test Final Score (AUDIT): 7 Alcohol Brief Interventions/Follow-up: Alcohol Education Substance Abuse History in the last 12 months:  Yes.   Consequences of Substance Abuse: denies Previous Psychotropic Medications: Yes  Psychological Evaluations: No  Past Medical History:  Past Medical History:  Diagnosis Date  . ADHD (attention deficit hyperactivity disorder)    on adderall since 2004  . Chronic systolic (congestive) heart failure (HCC)   . GERD (gastroesophageal reflux disease)   . HLD (hyperlipidemia)   . Hypertension   . Stroke (HCC)   . Tobacco abuse     Past Surgical History:  Procedure Laterality Date  . EYE SURGERY    . KNEE SURGERY    . TEE WITHOUT CARDIOVERSION N/A 11/22/2015   Procedure: TRANSESOPHAGEAL ECHOCARDIOGRAM (TEE);  Surgeon: Lewayne Bunting, MD;  Location: [facility] ENDOSCOPY;  Service: Cardiovascular;  Laterality: N/A;   Family History:  Family History  Problem Relation Age of Onset  . Hypertension Mother   . Hypertension Father   . Other Maternal Grandmother        multiple conditions    Family Psychiatric  History: Father with depression and anxiety. Brother with anxiety. Tobacco Screening: Have you used any form of tobacco in the last 30 days? (Cigarettes, Smokeless Tobacco, Cigars, and/or Pipes): No Social History:  Social History   Substance and Sexual Activity  Alcohol Use Yes  . Alcohol/week: 3.0 standard drinks  . Types: 3 Cans of beer per week   Comment: 3 beers per day; 3 x a week     Social History   Substance and Sexual Activity  Drug Use No    Additional Social History:                            Allergies:  No Known Allergies Lab Results:  Results for orders placed or performed during the hospital encounter of 08/03/18 (from the past 48 hour(s))  Brain natriuretic peptide     Status: None   Collection Time: 08/04/18  6:34 AM  Result Value Ref Range   B Natriuretic Peptide 19.5 0.0 - 100.0 pg/mL    Comment: Performed at [facility], [address], [place], [place] [address]  Basic metabolic panel     Status: Abnormal   Collection Time: 08/04/18  6:34 AM  Result Value Ref Range   Sodium 135 135 - 145 mmol/L   Potassium 3.0 (L) 3.5 - 5.1 mmol/L   Chloride 99 98 - 111 mmol/L   CO2 25 22 - 32 mmol/L   Glucose, Bld 92 70 - 99 mg/dL   BUN 10 6 - 20 mg/dL   Creatinine, Ser 7.41 0.61 - 1.24 mg/dL   Calcium 9.0  8.9 - 10.3 mg/dL   GFR calc non Af Amer >60 >60 mL/min   GFR calc Af Amer >60 >60 mL/min   Anion gap 11 5 - 15    Comment: Performed at [facility], [address], [place], [place] [address]  TSH     Status: None   Collection Time: 08/04/18  6:34 AM  Result Value Ref Range   TSH 3.107 0.350 - 4.500 uIU/mL    Comment: Performed by a 3rd Generation assay with a functional sensitivity of <=0.01 uIU/mL. Performed at [facility], [address], [place], [place] [address]     Blood Alcohol level:  Lab Results  Component Value Date   ETH 213 (H) 08/03/2018   ETH <5 11/21/2015    Metabolic Disorder Labs:  Lab Results  Component Value Date   HGBA1C 5.3 06/22/2018   MPG 105 11/22/2015   No results found for: PROLACTIN Lab Results  Component Value Date   CHOL 178 06/22/2018   TRIG 188 (H) 06/22/2018   HDL 50 06/22/2018   CHOLHDL 3.6 06/22/2018   VLDL 20 11/22/2015   LDLCALC 90 06/22/2018   LDLCALC 128 (H) 11/22/2015    Current Medications: Current Facility-Administered Medications  Medication Dose Route Frequency Provider Last Rate Last Dose  . amLODipine (NORVASC)  tablet 2.5 mg  2.5 mg Oral Daily Antonieta Pert, MD   2.5 mg at 08/04/18 0806  . atorvastatin (LIPITOR) tablet 20 mg  20 mg Oral q1800 Antonieta Pert, MD   20 mg at 08/03/18 1603  . carvedilol (COREG) tablet 6.25 mg  6.25 mg Oral BID WC Antonieta Pert, MD   6.25 mg at 08/04/18 0825  . dicyclomine (BENTYL) capsule 10 mg  10 mg Oral TID AC & HS Antonieta Pert, MD   10 mg at 08/04/18 [phone]  . furosemide (LASIX) tablet 40 mg  40 mg Oral Q3 days PRN Antonieta Pert, MD      . hydrOXYzine (ATARAX/VISTARIL) tablet 25 mg  25 mg Oral Q6H PRN Antonieta Pert, MD   25 mg at 08/03/18 1603  . loperamide (IMODIUM) capsule 2-4 mg  2-4 mg Oral PRN Antonieta Pert, MD      . LORazepam (ATIVAN) tablet 1 mg  1 mg Oral Q6H PRN Antonieta Pert, MD      . LORazepam (ATIVAN) tablet 1 mg  1 mg Oral QID Antonieta Pert, MD   1 mg at 08/04/18 1214   Followed by  . [START ON 08/05/2018] LORazepam (ATIVAN) tablet 1 mg  1 mg Oral TID Antonieta Pert, MD       Followed by  . [START ON 08/06/2018] LORazepam (ATIVAN) tablet 1 mg  1 mg Oral BID Antonieta Pert, MD       Followed by  . [START ON 08/07/2018] LORazepam (ATIVAN) tablet 1 mg  1 mg Oral Daily Antonieta Pert, MD      . multivitamin with minerals tablet 1 tablet  1 tablet Oral Daily Antonieta Pert, MD   1 tablet at 08/04/18 [phone]  . ondansetron (ZOFRAN-ODT) disintegrating tablet 4 mg  4 mg Oral Q6H PRN Antonieta Pert, MD      . pantoprazole (PROTONIX) EC tablet 40 mg  40 mg Oral Daily Antonieta Pert, MD   40 mg at 08/04/18 6606  . potassium chloride SA (K-DUR,KLOR-CON) CR tablet 20 mEq  20 mEq Oral Daily PRN Antonieta Pert,  MD      . sacubitril-valsartan (ENTRESTO) 97-103 mg per tablet  1 tablet Oral BID Antonieta Pert, MD   1 tablet at 08/04/18 0900  . sertraline (ZOLOFT) tablet 25 mg  25 mg Oral Daily Antonieta Pert, MD   25 mg at 08/04/18 1218  . spironolactone (ALDACTONE) tablet 25 mg  25 mg Oral Daily  Antonieta Pert, MD   25 mg at 08/04/18 0831  . thiamine (VITAMIN B-1) tablet 100 mg  100 mg Oral Daily Antonieta Pert, MD   100 mg at 08/04/18 1478   PTA Medications: Medications Prior to Admission  Medication Sig Dispense Refill Last Dose  . amLODipine (NORVASC) 2.5 MG tablet TAKE ONE TABLET BY MOUTH DAILY (Patient taking differently: Take 2.5 mg by mouth daily. ) 30 tablet 1 Taking  . atorvastatin (LIPITOR) 20 MG tablet TAKE ONE TABLET BY MOUTH DAILY AT 6:00 PM (Patient taking differently: Take 20 mg by mouth daily at 6 PM. ) 30 tablet 2 Taking  . buPROPion (WELLBUTRIN XL) 150 MG 24 hr tablet Take 30 mg by mouth daily.     . carvedilol (COREG) 6.25 MG tablet TAKE ONE TABLET BY MOUTH TWICE A DAY (Patient taking differently: Take 6.25 mg by mouth 2 (two) times daily with a meal. ) 60 tablet 2   . citalopram (CELEXA) 20 MG tablet Take 1 tablet (20 mg total) by mouth daily. 90 tablet 1 Taking  . colestipol (COLESTID) 1 g tablet Take 1 g by mouth 2 (two) times daily.     Marland Kitchen dicyclomine (BENTYL) 10 MG capsule Take 1 capsule (10 mg total) by mouth 3 (three) times daily before meals. 90 capsule 2 Taking  . furosemide (LASIX) 40 MG tablet Take 1 tablet (40 mg total) by mouth daily as needed. 30 tablet 2   . hydrOXYzine (ATARAX/VISTARIL) 25 MG tablet Take 1-2 tablets (25-50 mg total) by mouth every 8 (eight) hours as needed for itching. 60 tablet 1 Taking  . pantoprazole (PROTONIX) 40 MG tablet TAKE ONE TABLET BY MOUTH DAILY (Patient taking differently: Take 40 mg by mouth daily. ) 30 tablet 2 Taking  . potassium chloride SA (K-DUR,KLOR-CON) 20 MEQ tablet Take 1 tablet (20 mEq total) by mouth as needed. Take only when taking lasix 90 tablet 3   . sacubitril-valsartan (ENTRESTO) 97-103 MG Take 1 tablet by mouth 2 (two) times daily. 180 tablet 3   . spironolactone (ALDACTONE) 25 MG tablet TAKE ONE TABLET BY MOUTH DAILY (Patient taking differently: Take 25 mg by mouth daily. ) 30 tablet 2 Taking     Musculoskeletal: Strength & Muscle Tone: within normal limits Gait & Station: normal Patient leans: N/A  Psychiatric Specialty Exam: Physical Exam  Nursing note and vitals reviewed. Constitutional: He is oriented to person, place, and time. He appears well-developed and well-nourished.  Cardiovascular: Normal rate.  Respiratory: Effort normal.  Neurological: He is alert and oriented to person, place, and time.    Review of Systems  Constitutional: Negative.   Psychiatric/Behavioral: Positive for depression, substance abuse (ETOH) and suicidal ideas. Negative for hallucinations and memory loss. The patient is not nervous/anxious and does not have insomnia.     Blood pressure (!) 136/103, pulse 90, temperature 98.1 F (36.7 C), temperature source Oral, resp. rate 18, height  (1.753 m), weight 98 kg.Body mass index is 31.9 kg/m.  See MD's SRA    Treatment Plan Summary: Daily contact with patient to assess and evaluate symptoms and progress  in treatment and Medication management   Inpatient hospitalization.  See MD's admission SRA for medication management.  Patient will participate in the therapeutic group milieu.  Discharge disposition in progress.   Observation Level/Precautions:  15 minute checks  Laboratory:  BMP, GGT, hepatic function pending  Psychotherapy:  Group therapy  Medications:  See MAR  Consultations:  PRN  Discharge Concerns:  Safety and stabilization  Estimated LOS: 3-5 days  Other:     Physician Treatment Plan for Primary Diagnosis: MDD (major depressive disorder), severe (HCC) Long Term Goal(s): Improvement in symptoms so as ready for discharge  Short Term Goals: Ability to identify changes in lifestyle to reduce recurrence of condition will improve, Ability to verbalize feelings will improve and Ability to disclose and discuss suicidal ideas  Physician Treatment Plan for Secondary Diagnosis: Principal Problem:   MDD (major depressive  disorder), severe (HCC) Active Problems:   Alcohol abuse  Long Term Goal(s): Improvement in symptoms so as ready for discharge  Short Term Goals: Ability to demonstrate self-control will improve, Ability to identify and develop effective coping behaviors will improve and Ability to identify triggers associated with substance abuse/mental health issues will improve  I certify that inpatient services furnished can reasonably be expected to improve the patient's condition.    Aldean Baker, NP 3/12/202012:32 PM

## 2018-08-04 NOTE — BHH Counselor (Signed)
Adult Comprehensive Assessment  Patient ID: Austin Tran, male   DOB: 1986/04/14, 32 y.o.   MRN: [id number]  Information Source: Information source: Patient  Current Stressors:  Patient states their primary concerns and needs for treatment are:: Wasn't expecting to be hospitalized, hard to say. Patient states their goals for this hospitilization and ongoing recovery are:: Medication adjustments and get into therapy Educational / Learning stressors: Denies Employment / Job issues: Unemployed Family Relationships: Close to family Financial / Lack of resources (include bankruptcy): No income Housing / Lack of housing: Denies Physical health (include injuries & life threatening diseases): Had a stroke 3 years ago, congestive heart failure, conditions are monitored. Just started seeing Dr.Akintayo due to panic attacks. Social relationships: Denies, good friend group Substance abuse: Intermittent ETOH use, often socially. Drinks when he's bored. Bereavement / Loss: Denies  Living/Environment/Situation:  Living Arrangements: Parent Living conditions (as described by patient or guardian): Lives in a duplex in [place] Who else lives in the home?: Self  How long has patient lived in current situation?: 4-5 years  What is atmosphere in current home: Comfortable  Family History:  Marital status: Single Are you sexually active?: Yes What is your sexual orientation?: Heterosexual Has your sexual activity been affected by drugs, alcohol, medication, or emotional stress?: Depression has effected relationships in the past Does patient have children?: No  Childhood History:  By whom was/is the patient raised?: Both parents Description of patient's relationship with caregiver when they were a child: "We got along pretty well. On the surface everything was great." Patient's description of current relationship with people who raised him/her: Good relationship now, they want to support patient   How were you disciplined when you got in trouble as a child/adolescent?: Appropriately Does patient have siblings?: Yes Number of Siblings: 1 Description of patient's current relationship with siblings: Older brother, good relationship Did patient suffer any verbal/emotional/physical/sexual abuse as a child?: No Has patient ever been sexually abused/assaulted/raped as an adolescent or adult?: No Was the patient ever a victim of a crime or a disaster?: No Witnessed domestic violence?: No Has patient been effected by domestic violence as an adult?: No  Education:  Highest grade of school patient has completed: 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 Did You Receive Any Psychiatric Treatment/Services While in the Military?: No Are There Guns or Other Weapons in Your Home?: No  Financial Resources:   Psychologist, prison and probation services, Support from parents / caregiver  Alcohol/Substance Abuse:   What has been your use of drugs/alcohol within the last 12 months?: Drinks a Oceanographer, 3-4 nights a week If attempted suicide, did drugs/alcohol play a role in this?: No Alcohol/Substance Abuse Treatment Hx: Denies past history Has alcohol/substance abuse ever caused legal problems?: No  Social Support System:   Conservation officer, nature Support System: Good Describe Community Support System: Parents, friends, brother, ex-girlfriend Type of faith/religion: none How does patient's faith help to cope with current illness?:  n/a  Leisure/Recreation:   Leisure and Hobbies: Used to like Holiday representative,  golfing, playing disc-golf, concerts, traveling  Strengths/Needs:   What is the patient's perception of their strengths?: Good communicator, athletic, good sense of humor, hard working, reliable Patient states they can use these personal strengths during their treatment to contribute to their recovery: yes  Patient states these barriers may affect/interfere with their treatment: no Patient states these barriers may affect their return to the community: no  Discharge Plan:   Currently receiving community mental health services: No Patient states concerns and preferences for aftercare planning are: Wants an individual therapist that can address the underlying issues with anxiety and panic attacks. Recently started seeing Dr.Akintayo for medication management.  Patient states they will know when they are safe and ready for discharge when: Unsure Does patient have access to transportation?: Yes Does patient have financial barriers related to discharge medications?: Yes Patient description of barriers related to discharge medications: Parents support him financially Plan for no access to transportation at discharge: Has own vehicle Will patient be returning to same living situation after discharge?: Yes  Summary/Recommendations:   Summary and Recommendations (to be completed by the evaluator): Austin Tran "Austin Tran" is a 33 year old male from [facility] [place]). He presents voluntarily to [facility] from [facility] for worsening depression, panic attacks, and seeking a medication adjustment. Patient recently started seeing a new psychiatrist and states his depression and anxiety have worsened. Patient stressors include health issues related to heart failure and a stroke he had at age 58 and unemployment. He reports he will drink a 6 pack of beer 3-4 days a week but he does not believe he has a serious drinking problem. Patient would  like to be referred to an individual therapist and a new psychiatrist for medication management. He has no prior behavioral health history. Patient will benefit from crisis stabilization, medication management, therapeutic milieu, and referral for services.   Darreld Mclean. 08/04/2018

## 2018-08-05 LAB — BASIC METABOLIC PANEL
Anion gap: 12 (ref 5–15)
BUN: 8 mg/dL (ref 6–20)
CHLORIDE: 100 mmol/L (ref 98–111)
CO2: 25 mmol/L (ref 22–32)
Calcium: 9 mg/dL (ref 8.9–10.3)
Creatinine, Ser: 0.8 mg/dL (ref 0.61–1.24)
GFR calc Af Amer: >60 mL/min (ref >60)
GFR calc non Af Amer: >60 mL/min (ref >60)
Glucose, Bld: 99 mg/dL (ref 70–99)
Potassium: 3.3 mmol/L — ABNORMAL LOW (ref 3.5–5.1)
Sodium: 137 mmol/L (ref 135–145)

## 2018-08-05 LAB — HEPATIC FUNCTION PANEL
ALT: 63 U/L — ABNORMAL HIGH (ref 0–44)
AST: 49 U/L — ABNORMAL HIGH (ref 15–41)
Albumin: 3.9 g/dL (ref 3.5–5.0)
Alkaline Phosphatase: 73 U/L (ref 38–126)
Bilirubin, Direct: 0.1 mg/dL (ref 0.0–0.2)
Indirect Bilirubin: 0.4 mg/dL (ref 0.3–0.9)
Total Bilirubin: 0.5 mg/dL (ref 0.3–1.2)
Total Protein: 7.5 g/dL (ref 6.5–8.1)

## 2018-08-05 LAB — GAMMA GT: GGT: 114 U/L — AB (ref 7–50)

## 2018-08-05 MED ORDER — SERTRALINE HCL 50 MG PO TABS: 50.0000 mg | ORAL_TABLET | Freq: Every day | ORAL | 0 refills | Status: DC

## 2018-08-05 MED ORDER — ADULT MULTIVITAMIN W/MINERALS CH: 1.0000 | ORAL_TABLET | Freq: Every day | ORAL | Status: DC

## 2018-08-05 MED ORDER — HYDROXYZINE HCL 25 MG PO TABS: 25.0000 mg | ORAL_TABLET | Freq: Four times a day (QID) | ORAL | 0 refills | Status: DC | PRN

## 2018-08-05 MED ORDER — SERTRALINE HCL 50 MG PO TABS
50.0000 mg | ORAL_TABLET | Freq: Every day | ORAL | Status: DC
  Filled 2018-08-05 (×2): qty 1

## 2018-08-05 NOTE — Progress Notes (Signed)
  [facility] Adult Case Management Discharge Plan :  Will you be returning to the same living situation after discharge:  Yes,  home, after he stays with his parents over the weekend. At discharge, do you have transportation home?: Yes,  parents picking up Do you have the ability to pay for your medications: Yes,  BCBS INSURANCE  Release of information consent forms completed and in the chart Patient to Follow up at: Follow-up Information    [facility] Follow up on 08/10/2018.   Why:  Medication management appointment with Dr. Jannifer Franklin is Wednesday, 3/18 at 3:30p.  Please bring your current medications and discharge paperwork from this hospitalization. Contact information: [address] [place] [address] [phone]        [facility] Follow up on 08/08/2018.   Why:  Hospital follow up appointment is Monday, 3/16 at 8:30a.  Please bring your photo ID, proof of insurance, SSN, current medications, and discharge paperwork from this hospitalization.  Contact information: [address] [place] [address] [phone]           Next level of care provider has access to [facility] Link:no  Safety Planning and Suicide Prevention discussed: Yes,  with parents  Have you used any form of tobacco in the last 30 days? (Cigarettes, Smokeless Tobacco, Cigars, and/or Pipes): No  Has patient been referred to the Quitline?: N/A patient is not a smoker  Patient has been referred for addiction treatment: Yes  Darreld Mclean, LCSWA 08/05/2018, 2:13 PM

## 2018-08-05 NOTE — [facility] Suicide Risk Assessment (Signed)
[facility] INPATIENT:  Family/Significant Other Suicide Prevention Education  Suicide Prevention Education:  Education Completed; parents, Onalee Hua and Akshaj Sparhawk [phone]) has been identified by the patient as the family member/significant other with whom the patient will be residing, and identified as the person(s) who will aid the patient in the event of a mental health crisis (suicidal ideations/suicide attempt).  With written consent from the patient, the family member/significant other has been provided the following suicide prevention education, prior to the and/or following the discharge of the patient.  The suicide prevention education provided includes the following:  Suicide risk factors  Suicide prevention and interventions  National Suicide Hotline telephone number  [facility] assessment telephone number  [facility] Emergency Assistance 911  [facility] and/or Residential Mobile Crisis Unit telephone number  Request made of family/significant other to:  Remove weapons (e.g., guns, rifles, knives), all items previously/currently identified as safety concern.    Remove drugs/medications (over-the-counter, prescriptions, illicit drugs), all items previously/currently identified as a safety concern.  The family member/significant other verbalizes understanding of the suicide prevention education information provided.  The family member/significant other agrees to remove the items of safety concern listed above.  Parents expressed concerns for patient returning home back to his apartment; reporting the patient tends to isolate himself as he is currently unemployed. They have no specific concerns regarding suicide, but they do note they were aware of the patient drafting a suicide letter to his nieces/nephew prior to admission.  Parents think it would be ideal for the patient to stay with them for a short period of time, but report the patient has not been  interested. The parents are staying in a hotel in [place].  CSW reviewed outpatient follow up with parents and recommendations for the patient to engage in outpatient therapy (ideally on a weekly basis) and regular appointments with psychiatry. Parents are agreeable to discharge and stated they can be called at any time.  Darreld Mclean 08/05/2018, 9:39 AM

## 2018-08-05 NOTE — Progress Notes (Signed)
Discharge note: Patient reviewed discharge paperwork with RN including prescriptions, follow up appointments, and lab work. Patient given the opportunity to ask questions. All concerns were addressed. All belongings were returned to patient. Denied SI/HI/AVH. Patient thanked staff for their care while at the hospital.  Patient was discharged to lobby where his parents were waiting to pick him up.

## 2018-08-05 NOTE — Discharge Summary (Signed)
Physician Discharge Summary Note  Patient:  Austin Tran is an 33 y.o., male MRN:  [id number] DOB:  01-04-1986 Patient phone:  [phone] (home)  Patient address:   [address] [place] [address],  Total Time spent with patient: 15 minutes  Date of Admission:  08/03/2018 Date of Discharge: 08/05/18  Reason for Admission:  Suicidal ideation  Principal Problem: MDD (major depressive disorder), severe (HCC) Discharge Diagnoses: Principal Problem:   MDD (major depressive disorder), severe (HCC) Active Problems:   Alcohol abuse   Past Psychiatric History: Per admission H&P: Intermittent depression since diagnosed with CHF, cardiomyopathy and stroke three years ago. Currently seen by [place] Tran for medication management- weaned off Celexa one week ago and started Wellbutrin. Denies history of hospitalizations, rehab, or suicide attempts. Denies history of manic or psychotic symptoms.  Past Medical History:  Past Medical History:  Diagnosis Date  . ADHD (attention deficit hyperactivity disorder)    on adderall since 2004  . Chronic systolic (congestive) heart failure (HCC)   . GERD (gastroesophageal reflux disease)   . HLD (hyperlipidemia)   . Hypertension   . Stroke (HCC)   . Tobacco abuse     Past Surgical History:  Procedure Laterality Date  . EYE SURGERY    . KNEE SURGERY    . TEE WITHOUT CARDIOVERSION N/A 11/22/2015   Procedure: TRANSESOPHAGEAL ECHOCARDIOGRAM (TEE);  Surgeon: [place] Bunting, MD;  Location: [facility] ENDOSCOPY;  Service: Cardiovascular;  Laterality: N/A;   Family History:  Family History  Problem Relation Age of Onset  . Hypertension Mother   . Hypertension Father   . Other Maternal Grandmother        multiple conditions    Family Psychiatric  History: Per admission H&P: Father with depression and anxiety. Brother with anxiety. Social History:  Social History   Substance and Sexual Activity  Alcohol Use Yes  . Alcohol/week: 3.0  standard drinks  . Types: 3 Cans of beer per week   Comment: 3 beers per day; 3 x a week     Social History   Substance and Sexual Activity  Drug Use No    Social History   Socioeconomic History  . Marital status: Single    Spouse name: Not on file  . Number of children: Not on file  . Years of education: Not on file  . Highest education level: Not on file  Occupational History  . Occupation: Unemployed  Social Needs  . Financial resource strain: Not on file  . Food insecurity:    Worry: Not on file    Inability: Not on file  . Transportation needs:    Medical: Not on file    Non-medical: Not on file  Tobacco Use  . Smoking status: Former Smoker    Types: Cigarettes    Last attempt to quit: 11/23/2015    Years since quitting: 2.7  . Smokeless tobacco: Never Used  Substance and Sexual Activity  . Alcohol use: Yes    Alcohol/week: 3.0 standard drinks    Types: 3 Cans of beer per week    Comment: 3 beers per day; 3 x a week  . Drug use: No  . Sexual activity: Not Currently  Lifestyle  . Physical activity:    Days per week: Not on file    Minutes per session: Not on file  . Stress: Not on file  Relationships  . Social connections:    Talks on phone: Not on file  Gets together: Not on file    Attends religious service: Not on file    Active member of club or organization: Not on file    Attends meetings of clubs or organizations: Not on file    Relationship status: Not on file  Other Topics Concern  . Not on file  Social History Narrative   Client lives alone; he is unemployed.  He sees Austin Tran.    Hospital Course:  Per MD's admission SRA 08/04/2018: Patient is a 33 year old male who presented to the [facility] emergency department on voluntary basis with a complaint of suicidal ideation, depression. The patient lives in [place] with his parents, and is currently unemployed. The patient stated he began seeing Austin Tran month for treatment of  depression. He stated that he had been recently changed from Celexa to Wellbutrin. He stated he was having discontinuation problems from the Celexa and having "brain shocks". He also stated that he was not tolerating the Wellbutrin well. Patient stated that he had called a friend in a tearful state, and was thinking about harming himself. His parents were out of town. The patient also endorsed alcohol use with 3-4 beers per night, and drinking 3-4 times a week. He stated "I just drink beer". His blood alcohol on admission was 213. Evaluators last night contacted his parents, and stated that they were very concerned for the patient's safety. They did not feel confident that he would be safe by himself. The patient also has a significant past medical history for congestive heart failure and status post CVA. 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 He had previously had an ejection fraction of somewhere between 20 and 25%. He stated the last time he saw this cardiologist his ejection fracture had improved to approximately 40 to 50%.He denied any previous psychiatric hospitalizations. He denied any previous rehabilitation stays or detox stays. He was admitted to the hospital for evaluation and stabilization.  Austin Tran was admitted voluntarily for suicidal ideation in the context of alcohol abuse. He was started on Ativan CIWA protocol. Wellbutrin was discontinued. Zoloft was started. He participated in group therapy on the unit. He requested discharge soon after admission. He expressed desire for outpatient therapy and medication management. He remained on the [facility] unit for 2 days. He stabilized with medication and therapy. He was discharged on the medications listed below. He has shown improvement with improved mood, affect, sleep, appetite, and interaction. He denies any SI/HI/AVH and contracts for safety. He agrees to [facility] and [facility] (see below). Patient is provided with prescriptions for medications upon discharge. His parents are picking him up for discharge home.  Physical Findings: AIMS: Facial and Oral Movements Muscles of Facial Expression: None, normal Lips and Perioral Area: None, normal Jaw: None, normal Tongue: None, normal,Extremity Movements Upper (arms, wrists, hands, fingers): None, normal Lower (legs, knees, ankles, toes): None, normal, Trunk Movements Neck, shoulders, hips: None, normal, Overall Severity Severity of abnormal movements (highest score from questions above): None, normal Incapacitation due to abnormal movements: None, normal Patient's awareness of abnormal movements (rate only patient's report): No Awareness, Dental Status Current problems with teeth and/or dentures?: No Does patient usually wear dentures?: No  CIWA:  CIWA-Ar Total: 2 COWS:     Musculoskeletal: Strength & Muscle Tone: within normal limits Gait & Station: normal Patient leans: N/A  Psychiatric Specialty Exam: Physical Exam  Nursing note and vitals reviewed. Constitutional: He is oriented to person,  place, and time. He appears well-developed and well-nourished.  Cardiovascular: Normal rate.  Respiratory: Effort normal.  Neurological: He is alert and oriented to person, place, and time.    Review of Systems  Constitutional: Negative.   Psychiatric/Behavioral: Positive for depression (improving) and substance abuse (ETOH). Negative for hallucinations, memory loss and suicidal ideas. The patient is not nervous/anxious and does not have insomnia.     Blood pressure (!) 127/97, pulse 79, temperature 98.2 F (36.8 C), temperature source Oral, resp. rate 18, height 5\' 9"  (1.753 m), weight 98 kg.Body mass index is 31.9 kg/m.  See MD's discharge SRA     Have you used any form of tobacco in the last 30 days? (Cigarettes, Smokeless Tobacco, Cigars, and/or Pipes): No  Has this patient used any  form of tobacco in the last 30 days? (Cigarettes, Smokeless Tobacco, Cigars, and/or Pipes)  No  Blood Alcohol level:  Lab Results  Component Value Date   ETH 213 (H) 08/03/2018   ETH <5 11/21/2015    Metabolic Disorder Labs:  Lab Results  Component Value Date   HGBA1C 5.3 06/22/2018   MPG 105 11/22/2015   No results found for: PROLACTIN Lab Results  Component Value Date   CHOL 178 06/22/2018   TRIG 188 (H) 06/22/2018   HDL 50 06/22/2018   CHOLHDL 3.6 06/22/2018   VLDL 20 11/22/2015   LDLCALC 90 06/22/2018   LDLCALC 128 (H) 11/22/2015    See Psychiatric Specialty Exam and Suicide Risk Assessment completed by Attending Physician prior to discharge.  Discharge destination:  Home  Is patient on multiple antipsychotic therapies at discharge:  No   Has Patient had three or more failed trials of antipsychotic monotherapy by history:  No  Recommended Plan for Multiple Antipsychotic Therapies: NA  Discharge Instructions    Discharge instructions   Complete by:  As directed    Patient is instructed to take all prescribed medications as recommended. Report any side effects or adverse reactions to your outpatient psychiatrist. Patient is instructed to abstain from alcohol and illegal drugs while on prescription medications. In the event of worsening symptoms, patient is instructed to call the crisis hotline, 911, or go to the nearest emergency department for evaluation and treatment.     Allergies as of 08/05/2018   No Known Allergies     Medication List    STOP taking these medications   buPROPion 150 MG 24 hr tablet Commonly known as:  WELLBUTRIN XL   citalopram 20 MG tablet Commonly known as:  CELEXA   dicyclomine 10 MG capsule Commonly known as:  Bentyl     TAKE these medications     Indication  amLODipine 2.5 MG tablet Commonly known as:  NORVASC TAKE ONE TABLET BY MOUTH DAILY  Indication:  High Blood Pressure Disorder   atorvastatin 20 MG  tablet Commonly known as:  LIPITOR TAKE ONE TABLET BY MOUTH DAILY AT 6:00 PM What changed:  See the new instructions.  Indication:  High Amount of Fats in the Blood   carvedilol 6.25 MG tablet Commonly known as:  COREG TAKE ONE TABLET BY MOUTH TWICE A DAY What changed:  when to take this  Indication:  Heart Failure   colestipol 1 g tablet Commonly known as:  COLESTID Take 1 g by mouth 2 (two) times daily.  Indication:  High Amount of Cholesterol in the Blood   furosemide 40 MG tablet Commonly known as:  LASIX Take 1 tablet (40 mg total) by mouth daily  as needed.  Indication:  Edema   hydrOXYzine 25 MG tablet Commonly known as:  ATARAX/VISTARIL Take 1 tablet (25 mg total) by mouth every 6 (six) hours as needed (anxiety/agitation or CIWA < or = 10). What changed:    how much to take  when to take this  reasons to take this  Indication:  Feeling Anxious   multivitamin with minerals Tabs tablet Take 1 tablet by mouth daily. Start taking on:  August 06, 2018  Indication:  Supplementation   pantoprazole 40 MG tablet Commonly known as:  PROTONIX TAKE ONE TABLET BY MOUTH DAILY  Indication:  Gastroesophageal Reflux Disease   potassium chloride SA 20 MEQ tablet Commonly known as:  K-DUR,KLOR-CON Take 1 tablet (20 mEq total) by mouth as needed. Take only when taking lasix  Indication:  Low Amount of Potassium in the Blood   sacubitril-valsartan 97-103 MG Commonly known as:  Entresto Take 1 tablet by mouth 2 (two) times daily.  Indication:  Heart Failure   sertraline 50 MG tablet Commonly known as:  ZOLOFT Take 1 tablet (50 mg total) by mouth daily. For mood Start taking on:  August 06, 2018  Indication:  Mood   spironolactone 25 MG tablet Commonly known as:  ALDACTONE TAKE ONE TABLET BY MOUTH DAILY  Indication:  Heart Failure      Follow-up Information    [facility] on 08/10/2018.   Why:  Medication management appointment with Dr.  Jannifer Tran is Wednesday, 3/18 at 3:30p.  Please bring your current medications and discharge paperwork from this hospitalization. Contact information: [address] [place] [address] [phone]        [facility] Follow up on 08/08/2018.   Why:  Hospital follow up appointment is Monday, 3/16 at 8:30a.  Please bring your photo ID, proof of insurance, SSN, current medications, and discharge paperwork from this hospitalization.  Contact information: [address] [place] [address] [phone]           Follow-up recommendations: Activity as tolerated. Diet as recommended by primary care physician. Keep all scheduled follow-up appointments as recommended.  Comments:   Patient is instructed to take all prescribed medications as recommended. Report any side effects or adverse reactions to your outpatient psychiatrist. Patient is instructed to abstain from alcohol and illegal drugs while on prescription medications. In the event of worsening symptoms, patient is instructed to call the crisis hotline, 911, or go to the nearest emergency department for evaluation and treatment.  Signed: Aldean Baker, NP 08/05/2018, 1:26 PM

## 2018-08-05 NOTE — Progress Notes (Signed)
Austin Tran is c/o of withdrawal s/s,  restlessness, and insomnia. Pt encourage to talk to provider tomorrow. Ativan PRN given. Will continue with POC.

## 2018-08-05 NOTE — Tx Team (Signed)
Interdisciplinary Treatment and Diagnostic Plan Update  08/05/2018 Time of Session: 10:00am Austin Tran MRN: [id number]  Principal Diagnosis: MDD (major depressive disorder), severe (HCC)  Secondary Diagnoses: Principal Problem:   MDD (major depressive disorder), severe (HCC) Active Problems:   Alcohol abuse   Current Medications:  Current Facility-Administered Medications  Medication Dose Route Frequency Provider Last Rate Last Dose  . amLODipine (NORVASC) tablet 2.5 mg  2.5 mg Oral Daily Antonieta Pert, MD   2.5 mg at 08/05/18 0802  . atorvastatin (LIPITOR) tablet 20 mg  20 mg Oral q1800 Antonieta Pert, MD   20 mg at 08/04/18 1737  . carvedilol (COREG) tablet 6.25 mg  6.25 mg Oral BID WC Antonieta Pert, MD   6.25 mg at 08/05/18 0802  . dicyclomine (BENTYL) capsule 10 mg  10 mg Oral TID AC & HS Antonieta Pert, MD   10 mg at 08/05/18 0803  . furosemide (LASIX) tablet 40 mg  40 mg Oral Q3 days PRN Antonieta Pert, MD      . hydrOXYzine (ATARAX/VISTARIL) tablet 25 mg  25 mg Oral Q6H PRN Antonieta Pert, MD   25 mg at 08/04/18 2118  . loperamide (IMODIUM) capsule 2-4 mg  2-4 mg Oral PRN Antonieta Pert, MD      . LORazepam (ATIVAN) tablet 1 mg  1 mg Oral Q6H PRN Antonieta Pert, MD   1 mg at 08/05/18 0002  . multivitamin with minerals tablet 1 tablet  1 tablet Oral Daily Antonieta Pert, MD   1 tablet at 08/05/18 0802  . ondansetron (ZOFRAN-ODT) disintegrating tablet 4 mg  4 mg Oral Q6H PRN Antonieta Pert, MD      . pantoprazole (PROTONIX) EC tablet 40 mg  40 mg Oral Daily Antonieta Pert, MD   40 mg at 08/05/18 0803  . potassium chloride SA (K-DUR,KLOR-CON) CR tablet 20 mEq  20 mEq Oral Daily PRN Antonieta Pert, MD      . sacubitril-valsartan (ENTRESTO) 97-103 mg per tablet  1 tablet Oral BID Antonieta Pert, MD   1 tablet at 08/05/18 1214  . [START ON 08/06/2018] sertraline (ZOLOFT) tablet 50 mg  50 mg Oral Daily Antonieta Pert, MD       . spironolactone (ALDACTONE) tablet 25 mg  25 mg Oral Daily Antonieta Pert, MD   25 mg at 08/05/18 0803  . thiamine (VITAMIN B-1) tablet 100 mg  100 mg Oral Daily Antonieta Pert, MD   100 mg at 08/05/18 0803   PTA Medications: Medications Prior to Admission  Medication Sig Dispense Refill Last Dose  . amLODipine (NORVASC) 2.5 MG tablet TAKE ONE TABLET BY MOUTH DAILY (Patient taking differently: Take 2.5 mg by mouth daily. ) 30 tablet 1 Taking  . atorvastatin (LIPITOR) 20 MG tablet TAKE ONE TABLET BY MOUTH DAILY AT 6:00 PM (Patient taking differently: Take 20 mg by mouth daily at 6 PM. ) 30 tablet 2 Taking  . buPROPion (WELLBUTRIN XL) 150 MG 24 hr tablet Take 30 mg by mouth daily.     . carvedilol (COREG) 6.25 MG tablet TAKE ONE TABLET BY MOUTH TWICE A DAY (Patient taking differently: Take 6.25 mg by mouth 2 (two) times daily with a meal. ) 60 tablet 2   . citalopram (CELEXA) 20 MG tablet Take 1 tablet (20 mg total) by mouth daily. 90 tablet 1 Taking  . colestipol (COLESTID) 1 g tablet Take 1 g by mouth 2 (  two) times daily.     Marland Kitchen dicyclomine (BENTYL) 10 MG capsule Take 1 capsule (10 mg total) by mouth 3 (three) times daily before meals. 90 capsule 2 Taking  . furosemide (LASIX) 40 MG tablet Take 1 tablet (40 mg total) by mouth daily as needed. 30 tablet 2   . hydrOXYzine (ATARAX/VISTARIL) 25 MG tablet Take 1-2 tablets (25-50 mg total) by mouth every 8 (eight) hours as needed for itching. 60 tablet 1 Taking  . pantoprazole (PROTONIX) 40 MG tablet TAKE ONE TABLET BY MOUTH DAILY (Patient taking differently: Take 40 mg by mouth daily. ) 30 tablet 2 Taking  . potassium chloride SA (K-DUR,KLOR-CON) 20 MEQ tablet Take 1 tablet (20 mEq total) by mouth as needed. Take only when taking lasix 90 tablet 3   . sacubitril-valsartan (ENTRESTO) 97-103 MG Take 1 tablet by mouth 2 (two) times daily. 180 tablet 3   . spironolactone (ALDACTONE) 25 MG tablet TAKE ONE TABLET BY MOUTH DAILY (Patient taking  differently: Take 25 mg by mouth daily. ) 30 tablet 2 Taking    Patient Stressors: Health problems Medication change or noncompliance  Patient Strengths: Ability for insight Average or above average intelligence Capable of independent living General fund of knowledge Motivation for treatment/growth  Treatment Modalities: Medication Management, Group therapy, Case management,  1 to 1 session with clinician, Psychoeducation, Recreational therapy.   Physician Treatment Plan for Primary Diagnosis: MDD (major depressive disorder), severe (HCC) Long Term Goal(s): Improvement in symptoms so as ready for discharge Improvement in symptoms so as ready for discharge   Short Term Goals: Ability to identify changes in lifestyle to reduce recurrence of condition will improve Ability to verbalize feelings will improve Ability to disclose and discuss suicidal ideas Ability to demonstrate self-control will improve Ability to identify and develop effective coping behaviors will improve Ability to identify triggers associated with substance abuse/mental health issues will improve  Medication Management: Evaluate patient's response, side effects, and tolerance of medication regimen.  Therapeutic Interventions: 1 to 1 sessions, Unit Group sessions and Medication administration.  Evaluation of Outcomes: Adequate for Discharge  Physician Treatment Plan for Secondary Diagnosis: Principal Problem:   MDD (major depressive disorder), severe (HCC) Active Problems:   Alcohol abuse  Long Term Goal(s): Improvement in symptoms so as ready for discharge Improvement in symptoms so as ready for discharge   Short Term Goals: Ability to identify changes in lifestyle to reduce recurrence of condition will improve Ability to verbalize feelings will improve Ability to disclose and discuss suicidal ideas Ability to demonstrate self-control will improve Ability to identify and develop effective coping behaviors  will improve Ability to identify triggers associated with substance abuse/mental health issues will improve     Medication Management: Evaluate patient's response, side effects, and tolerance of medication regimen.  Therapeutic Interventions: 1 to 1 sessions, Unit Group sessions and Medication administration.  Evaluation of Outcomes: Adequate for Discharge   RN Treatment Plan for Primary Diagnosis: MDD (major depressive disorder), severe (HCC) Long Term Goal(s): Knowledge of disease and therapeutic regimen to maintain health will improve  Short Term Goals: Ability to remain free from injury will improve, Ability to verbalize frustration and anger appropriately will improve, Ability to identify and develop effective coping behaviors will improve and Compliance with prescribed medications will improve  Medication Management: RN will administer medications as ordered by provider, will assess and evaluate patient's response and provide education to patient for prescribed medication. RN will report any adverse and/or side effects to prescribing provider.  Therapeutic Interventions: 1 on 1 counseling sessions, Psychoeducation, Medication administration, Evaluate responses to treatment, Monitor vital signs and CBGs as ordered, Perform/monitor CIWA, COWS, AIMS and Fall Risk screenings as ordered, Perform wound care treatments as ordered.  Evaluation of Outcomes: Adequate for Discharge   LCSW Treatment Plan for Primary Diagnosis: MDD (major depressive disorder), severe (HCC) Long Term Goal(s): Safe transition to appropriate next level of care at discharge, Engage patient in therapeutic group addressing interpersonal concerns.  Short Term Goals: Engage patient in aftercare planning with referrals and resources, Increase social support, Identify triggers associated with mental health/substance abuse issues and Increase skills for wellness and recovery  Therapeutic Interventions: Assess for all  discharge needs, 1 to 1 time with Social worker, Explore available resources and support systems, Assess for adequacy in community support network, Educate family and significant other(s) on suicide prevention, Complete Psychosocial Assessment, Interpersonal group therapy.  Evaluation of Outcomes: Adequate for Discharge   Progress in Treatment: Attending groups: Yes. Participating in groups: No. Taking medication as prescribed: Yes. Toleration medication: Yes. Family/Significant other contact made: Yes, individual(s) contacted:  mother and father Patient understands diagnosis: Yes. Discussing patient identified problems/goals with staff: Yes. Medical problems stabilized or resolved: Yes. Denies suicidal/homicidal ideation: Yes. Issues/concerns per patient self-inventory: Yes.  New problem(s) identified: No, Describe:  none  New Short Term/Long Term Goal(s): detox, medication management for mood stabilization; elimination of SI thoughts; development of comprehensive mental wellness/sobriety plan.  Patient Goals:  Medication adjustments and get into therapy  Discharge Plan or Barriers: Referred to [facility] for therapy, follow up with [facility] for medication management. Discharge home.  Reason for Continuation of Hospitalization: Anxiety Depression  Estimated Length of Stay: discharge today  Attendees: Patient: Austin Tran 08/05/2018 2:09 PM  Physician:  08/05/2018 2:09 PM  Nursing:  08/05/2018 2:09 PM  RN Care Manager: 08/05/2018 2:09 PM  Social Worker: Enid Cutter, [place] 08/05/2018 2:09 PM  Recreational Therapist:  08/05/2018 2:09 PM  Other:  08/05/2018 2:09 PM  Other:  08/05/2018 2:09 PM  Other: 08/05/2018 2:09 PM    Scribe for Treatment Team: Darreld Mclean, LCSWA 08/05/2018 2:09 PM

## 2018-08-05 NOTE — Plan of Care (Signed)
  Problem: Education: Goal: Knowledge of [place] [facility] Education information/materials will improve Outcome: Adequate for Discharge   Problem: Education: Goal: Emotional status will improve Outcome: Adequate for Discharge   Problem: Education: Goal: Mental status will improve 08/05/2018 1438 by Dewayne Shorter, RN Outcome: Adequate for Discharge 08/05/2018 0916 by Dewayne Shorter, RN Outcome: Not Progressing   Problem: Education: Goal: Verbalization of understanding the information provided will improve 08/05/2018 1438 by Dewayne Shorter, RN Outcome: Adequate for Discharge 08/05/2018 0916 by Dewayne Shorter, RN Outcome: Progressing

## 2018-08-05 NOTE — Progress Notes (Signed)
Recreation Therapy Notes  Date:  3.13.20 Time: 0930 Location: [address] Dayroom  Group Topic: Stress Management  Goal Area(s) Addresses:  Patient will identify positive stress management techniques. Patient will identify benefits of using stress management post d/c.  Behavioral Response:  Engaged  Intervention:  Stress Management  Activity :  Meditation.  LRT introduced the stress management technique of meditation.  LRT played a meditation that focused on approaching the day with endless possibilities.  Education:  Stress Management, Discharge Planning.   Education Outcome: Acknowledges Education  Clinical Observations/Feedback:  Pt attended and participated in group.     Caroll Rancher, LRT/CTRS         Caroll Rancher A 08/05/2018 10:47 AM

## 2018-08-05 NOTE — Plan of Care (Addendum)
Patient self inventory- Patient slept poor last night, sleep medication was requested (he didn't have any) and was not helpful. Appetite is good, energy level normal, concentration poor. Depression, hopelessness, and anxiety rated 3, 3, 4 out of 10. Denies withdrawal symptoms. Denies physical pain. Goal: Sleeping and figuring what I will do when I get out. Patient is compliant with mediations. Safety maintained with 15 minute checks. Will continue to monitor and provide support.  Patient said Bentyl does not help him and he needs a different medication- he said someone said he should be taking it before bed. Pt was encouraged to speak with MD about this.  Problem: Education: Goal: Verbalization of understanding the information provided will improve Outcome: Progressing   Problem: Activity: Goal: Interest or engagement in activities will improve Outcome: Progressing   Problem: Education: Goal: Mental status will improve Outcome: Not Progressing   Problem: Activity: Goal: Sleeping patterns will improve Outcome: Not Progressing

## 2018-08-05 NOTE — [facility] Suicide Risk Assessment (Signed)
[facility] Discharge Suicide Risk Assessment   Principal Problem: MDD (major depressive disorder), severe (HCC) Discharge Diagnoses: Principal Problem:   MDD (major depressive disorder), severe (HCC) Active Problems:   Alcohol abuse   Total Time spent with patient: 15 minutes  Musculoskeletal: Strength & Muscle Tone: within normal limits Gait & Station: normal Patient leans: N/A  Psychiatric Specialty Exam: Review of Systems  All other systems reviewed and are negative.   Blood pressure (!) 127/97, pulse 79, temperature 98.2 F (36.8 C), temperature source Oral, resp. rate 18, height 5\' 9"  (1.753 m), weight 98 kg.Body mass index is 31.9 kg/m.  General Appearance: Casual  Eye Contact::  Fair  Speech:  Normal Rate409  Volume:  Normal  Mood:  Anxious  Affect:  Congruent  Thought Process:  Coherent and Descriptions of Associations: Intact  Orientation:  Full (Time, Place, and Person)  Thought Content:  Logical  Suicidal Thoughts:  No  Homicidal Thoughts:  No  Memory:  Immediate;   Fair Recent;   Fair Remote;   Fair  Judgement:  Intact  Insight:  Fair  Psychomotor Activity:  Normal  Concentration:  Fair  Recall:  [employer] of Knowledge:Fair  Language: Fair  Akathisia:  Negative  Handed:  Right  AIMS (if indicated):     Assets:  Communication Skills Desire for Improvement Financial Resources/Insurance Housing Leisure Time Physical Health Resilience  Sleep:  Number of Hours: 3.25  Cognition: WNL  ADL's:  Intact   Mental Status Per Nursing Assessment::   On Admission:  Self-harm thoughts  Demographic Factors:  Male, Caucasian and Unemployed  Loss Factors: NA  Historical Factors: Impulsivity  Risk Reduction Factors:   Sense of responsibility to family, Living with another person, especially a relative and Positive social support  Continued Clinical Symptoms:  Depression:   Comorbid alcohol abuse/dependence Impulsivity Alcohol/Substance  Abuse/Dependencies  Cognitive Features That Contribute To Risk:  None    Suicide Risk:  Minimal: No identifiable suicidal ideation.  Patients presenting with no risk factors but with morbid ruminations; may be classified as minimal risk based on the severity of the depressive symptoms  Follow-up Information    [facility] Follow [facility] on 08/10/2018.   Why:  Medication management appointment with Dr. Jannifer Franklin is Wednesday, 3/18 at 3:30p.  Please bring your current medications and discharge paperwork from this hospitalization. Contact information: [address] [place] [address] [phone]        [facility] Follow up.   Contact information: [address] [place] [address] [phone]           Plan Of Care/Follow-up recommendations:  Activity:  ad lib  Antonieta Pert, MD 08/05/2018, 10:14 AM

## 2018-08-26 ENCOUNTER — Other Ambulatory Visit (HOSPITAL_COMMUNITY): Payer: Self-pay | Admitting: Internal Medicine

## 2018-08-28 ENCOUNTER — Other Ambulatory Visit (HOSPITAL_COMMUNITY): Payer: Self-pay | Admitting: Internal Medicine

## 2018-10-02 ENCOUNTER — Other Ambulatory Visit (HOSPITAL_COMMUNITY): Payer: Self-pay | Admitting: Internal Medicine

## 2018-10-20 ENCOUNTER — Other Ambulatory Visit (HOSPITAL_COMMUNITY): Payer: Self-pay | Admitting: Internal Medicine

## 2018-11-18 ENCOUNTER — Other Ambulatory Visit (HOSPITAL_COMMUNITY): Payer: Self-pay

## 2018-11-18 MED ORDER — HYDROXYZINE HCL 25 MG PO TABS: 25.0000 mg | ORAL_TABLET | Freq: Four times a day (QID) | ORAL | 0 refills | Status: DC | PRN

## 2018-11-18 MED ORDER — SERTRALINE HCL 50 MG PO TABS: 50.0000 mg | ORAL_TABLET | Freq: Every day | ORAL | 0 refills | Status: DC

## 2018-12-01 ENCOUNTER — Other Ambulatory Visit (HOSPITAL_COMMUNITY): Payer: Self-pay | Admitting: Internal Medicine

## 2018-12-02 ENCOUNTER — Other Ambulatory Visit (HOSPITAL_COMMUNITY): Payer: Self-pay | Admitting: Internal Medicine

## 2018-12-07 ENCOUNTER — Other Ambulatory Visit (HOSPITAL_COMMUNITY): Payer: Self-pay | Admitting: Internal Medicine

## 2018-12-09 ENCOUNTER — Other Ambulatory Visit (HOSPITAL_COMMUNITY): Payer: Self-pay | Admitting: Internal Medicine

## 2018-12-12 ENCOUNTER — Other Ambulatory Visit (HOSPITAL_COMMUNITY): Payer: Self-pay | Admitting: Internal Medicine

## 2018-12-12 ENCOUNTER — Telehealth (HOSPITAL_COMMUNITY): Payer: Self-pay

## 2018-12-12 NOTE — Telephone Encounter (Signed)
Received message on triage phone as patient requesting refill for protonix.  When called back, no answer. LM on his voicemail stating protonix was recently denied as HF office does not refill protonix and may have been done in the past as a courtesy.  Advised to contact primary doctor for refills going forward.

## 2018-12-13 ENCOUNTER — Other Ambulatory Visit (HOSPITAL_COMMUNITY): Payer: Self-pay | Admitting: Internal Medicine

## 2018-12-13 NOTE — Telephone Encounter (Signed)
1) Medication(s) Requested (by name):potassium chloride SA (K-DUR,KLOR-CON) 20 MEQ tablet [[id number]]  hydrOXYzine (ATARAX/VISTARIL) 25 MG tablet [[id number]]    2) [facility] [place] - [address]   3) Special Requests:   Approved medications will be sent to the pharmacy, we will reach out if there is an issue.  Requests made after 3pm may not be addressed until the following business day!  If a patient is unsure of the name of the medication(s) please note and ask patient to call back when they are able to provide all info, do not send to responsible party until all information is available!

## 2018-12-14 ENCOUNTER — Other Ambulatory Visit (HOSPITAL_COMMUNITY): Payer: Self-pay | Admitting: Internal Medicine

## 2018-12-14 ENCOUNTER — Other Ambulatory Visit: Payer: Self-pay

## 2018-12-14 MED ORDER — PANTOPRAZOLE SODIUM 40 MG PO TBEC: 40.0000 mg | DELAYED_RELEASE_TABLET | Freq: Every day | ORAL | 0 refills | Status: DC

## 2018-12-14 NOTE — Telephone Encounter (Signed)
Patient was referred to psych who should handle refills for psych meds.  Cardiologist refills potassium. Should contact Dr. Haroldine Laws

## 2018-12-26 ENCOUNTER — Other Ambulatory Visit (HOSPITAL_COMMUNITY): Payer: Self-pay | Admitting: Internal Medicine

## 2019-01-09 ENCOUNTER — Other Ambulatory Visit: Payer: Self-pay | Admitting: Nurse Practitioner

## 2019-01-26 ENCOUNTER — Other Ambulatory Visit (HOSPITAL_COMMUNITY): Payer: Self-pay | Admitting: Internal Medicine

## 2019-02-01 ENCOUNTER — Telehealth (HOSPITAL_COMMUNITY): Payer: Self-pay | Admitting: *Deleted

## 2019-02-01 ENCOUNTER — Telehealth (HOSPITAL_COMMUNITY): Payer: Self-pay | Admitting: Pharmacy Technician

## 2019-02-01 NOTE — Telephone Encounter (Signed)
Pt left VM stating he is having issues affording entresto pt states our pharmacist normally helps him get this at a cheaper cost.   Routed to [facility] for assistance

## 2019-02-01 NOTE — Telephone Encounter (Signed)
Received message from clinic regarding patient needing help affording his Entresto. Reached out to insurance on file as I was having a hard time finding accurate information to run a claim. Insurance rep informed me that his insurance was termed November 22, 2018. Left patient a message. Will confirm if there is any active coverage with patient. If not, we will proceed with another application to [employer].  Charlann Boxer, CPhT

## 2019-02-03 NOTE — Telephone Encounter (Signed)
Spoke to patient this morning. He was not sure if he had active insurance or not. Told him I would call the number on the card we have on file ([place] from 2020) to see if there was any active coverage and then we would go from there.  There is no active coverage, could not find any when doing search. Called insurance and the representative stated that the policy was termed June 2020. We will get him patient assistance through [employer].  Patient has been out of medication for about a week he states. Provided two bottles of Entresto 49-51mg  and informed him that he would need to take two tablets twice a day since he typically is on the 97mg  dosing as we do not have that sample strength in the office.   2 bottles (2 week supply)  LOT: [id number]  Exp: 10/2020  Will send in patient application after he comes in to sign it. Both samples and application were taken to the front check in desk. Patient will come by today to complete and pick up.  Will continue to follow  Charlann Boxer, CPhT

## 2019-02-10 ENCOUNTER — Other Ambulatory Visit (HOSPITAL_COMMUNITY): Payer: Self-pay | Admitting: Internal Medicine

## 2019-02-10 ENCOUNTER — Other Ambulatory Visit: Payer: Self-pay | Admitting: Family Medicine

## 2019-02-13 ENCOUNTER — Other Ambulatory Visit: Payer: Self-pay | Admitting: Family Medicine

## 2019-02-13 NOTE — Telephone Encounter (Signed)
Patient was approved to receive Entresto through [employer].  ID: [id number]  Effective Dates: 02/10/2019 through 02/10/2020  Called patient and left message regarding approval with [employer] phone number.  Phone #: [phone]  Advised patient to call in for his refill and to call me with any issues.  Charlann Boxer, CPhT

## 2019-02-14 ENCOUNTER — Telehealth: Payer: Self-pay | Admitting: Family Medicine

## 2019-02-14 NOTE — Telephone Encounter (Signed)
1) Medication(s) Requested (by name): pantoprazole (PROTONIX) 40 MG tablet [[id number]]   2) Pharmacy of Choice:  [facility] [address]   Approved medications will be sent to pharmacy, we will reach out to you if there is an issue.  Requests made after 3pm may not be addressed until following business day!

## 2019-02-14 NOTE — Telephone Encounter (Signed)
Rx sent on 01/09/2019 says that he needs an appointment. Hasn't been seen since January. Once an appointment is made a short Rx can be sent to his pharmacy.

## 2019-02-16 ENCOUNTER — Other Ambulatory Visit: Payer: Self-pay | Admitting: Family Medicine

## 2019-02-17 NOTE — Telephone Encounter (Signed)
Patient appointment has been made for 02/20/2019 @ 910

## 2019-02-20 ENCOUNTER — Other Ambulatory Visit: Payer: Self-pay | Admitting: Family Medicine

## 2019-02-20 ENCOUNTER — Ambulatory Visit (INDEPENDENT_AMBULATORY_CARE_PROVIDER_SITE_OTHER): Payer: Self-pay | Admitting: Family Medicine

## 2019-02-20 DIAGNOSIS — K219 Gastro-esophageal reflux disease without esophagitis: Secondary | ICD-10-CM

## 2019-02-20 DIAGNOSIS — R5383 Other fatigue: Secondary | ICD-10-CM

## 2019-02-20 DIAGNOSIS — F411 Generalized anxiety disorder: Secondary | ICD-10-CM

## 2019-02-20 MED ORDER — PANTOPRAZOLE SODIUM 40 MG PO TBEC: 40.0000 mg | DELAYED_RELEASE_TABLET | Freq: Every day | ORAL | 1 refills | Status: DC

## 2019-02-20 NOTE — Progress Notes (Signed)
Virtual Visit via Telephone Note  I connected with@ on 02/20/19 at  9:10 AM EDT by telephone and verified that I am speaking with the correct person using two identifiers.   I discussed the limitations, risks, security and privacy concerns of performing an evaluation and management service by telephone and the availability of in person appointments. I also discussed with the patient that there may be a patient responsible charge related to this service. The patient expressed understanding and agreed to proceed.  Patient [place] Provider Location: [facility] Others participating in call: none   History of Present Illness:        33 year old male requesting refill of Protonix for continued treatment of acid reflux.  He states that as long as he takes the medication on a daily basis he does not have any issues with reflux type symptoms.  He denies any current abdominal pain, no nausea or vomiting, no burping or belching, no blood in the stool or dark/black stools.  He reports that his other medications are refilled per his cardiologist for his heart failure.  He reports that he has been following up by phone regularly with cardiology.        Patient mentions that he is having issues with fatigue.  He admits that he does not keep a regular sleep schedule due to isolation with COVID-19.  He reports that he [place] and is avoiding contact with others to prevent illness.  He states that he feels as if he has lack of motivation and reports that he was on medication for ADHD in the past prior to his diagnosis of heart failure.  He was told that he could not take the ADHD medicine due to his heart failure with ejection fraction of 15%.  He states that he was recently told that his most recent echo was normal.  Patient was asked if he thinks that his anxiety/depression may be causing his fatigue.  Patient initially stated that he  believes his current dose of sertraline is sufficient and that in the past when his mental health provider changed his medications it caused problems.  Patient then states that he will contact his mental health provider to have the mental health provider contact his cardiologist regarding restart of ADHD medicine.        Patient requests an increase in the number of hydroxyzine that he receives per month.  He states that hydroxyzine is currently being managed by his cardiologist and he receives 30 pills/month which per patient averages out to only a 10-day supply upon further questioning, he states he does not take hydroxyzine every day but takes it on occasion when he is not sleeping well and wakes up in the middle of the night with a nightmare/increased anxiety and then cannot get back to sleep.  Patient states that he would like a prescription for 60 pills/month just as a security blanket as he feels better knowing that he has extra medication.  Patient wanted to know if this office could prescribe his hydroxyzine instead of cardiology and increase the dose.  When patient was questioned as to having his mental health provider prescribed hydroxyzine, patient admits that he does not currently have a mental health provider as the mental health provider does not take his current insurance and he will have to establish with a different mental health provider.   Past Medical History:  Diagnosis Date  . ADHD (  attention deficit hyperactivity disorder)    on adderall since 2004  . Chronic systolic (congestive) heart failure (HCC)   . GERD (gastroesophageal reflux disease)   . HLD (hyperlipidemia)   . Hypertension   . Stroke (HCC)   . Tobacco abuse     Past Surgical History:  Procedure Laterality Date  . EYE SURGERY    . KNEE SURGERY    . TEE WITHOUT CARDIOVERSION N/A 11/22/2015   Procedure: TRANSESOPHAGEAL ECHOCARDIOGRAM (TEE);  Surgeon: Lewayne Bunting, MD;  Location[facility] ENDOSCOPY;  Service:  Cardiovascular;  Laterality: N/A;    Family History  Problem Relation Age of Onset  . Hypertension Mother   . Hypertension Father   . Other Maternal Grandmother        multiple conditions     Social History   Tobacco Use  . Smoking status: Former Smoker    Types: Cigarettes    Quit date: 11/23/2015    Years since quitting: 3.2  . Smokeless tobacco: Never Used  Substance Use Topics  . Alcohol use: Yes    Alcohol/week: 3.0 standard drinks    Types: 3 Cans of beer per week    Comment: 3 beers per day; 3 x a week  . Drug use: No     No Known Allergies     Observations/Objective: No vital signs or physical exam conducted as visit was done via telephone  Assessment and Plan: 1. Gastroesophageal reflux disease, esophagitis presence not specified He reports that his reflux symptoms are currently controlled with use of Protonix daily.  New prescription provided.  He is of course advised to avoid the use of nonsteroidal anti-inflammatories, avoid alcohol intake, avoid known trigger foods and avoid eating within 2 hours of bedtime to help control/prevent reflux symptoms. - pantoprazole (PROTONIX) 40 MG tablet; Take 1 tablet (40 mg total) by mouth daily. To reduce stomach acid  Dispense: 90 tablet; Refill: 1  2. GAD (generalized anxiety disorder) Patient is currently on sertraline/Zoloft and initially stated that he had a mental health provider then later stated that he is not currently established with a mental health provider.  He also currently takes hydroxyzine as needed for acute anxiety and requests refill through this office of the medication with at least 60 pills/month.  Patient was told to contact his cardiology office as the other current prescribers and discuss his symptoms to see if cardiology would be willing to increase his dose and otherwise it may be possible that medicine will be prescribed through this office.  Chart notes of course would need to be reviewed to see if  patient did actually contact his cardiologist and patient sounds as if he does need to establish care again with mental health provider for continued treatment of anxiety/history of depression and history of ADHD.  3. Fatigue, unspecified type Discussed with the patient that there may be several factors for his fatigue.  Patient was asked to schedule lab visit for blood work for further evaluation of his fatigue as he has not had any recent blood work per review of chart.  Per patient he does not wish to come into the office due to concerns regarding COVID in addition to the fact that he is staying [place] his [place].  Sleep hygiene reviewed and suggested that patient set a regular bedtime and regular time for awakening and he can take over-the-counter melatonin to help with sleep.  Patient was also asked to schedule follow-up with cardiology as I do  not see any recent cardiology telemedicine visits and at his last visit in February he was asked to return in 6 months for repeat echo.  Patient was asked to call the office when he would like to schedule for lab visit in follow-up of his fatigue and chronic medical issues.  Patient likely needs complete metabolic panel, TSH, vitamin D level and CBC in follow-up of his fatigue and long-term use of medications.  Patient hopefully will have lipid panel done per cardiology.  Follow Up Instructions: 4 to 6 months and as needed; schedule lab visit hopefully within the next 1 to 2 months    I discussed the assessment and treatment plan with the patient. The patient was provided an opportunity to ask questions and all were answered. The patient agreed with the plan and demonstrated an understanding of the instructions.   The patient was advised to call back or seek an in-person evaluation if the symptoms worsen or if the condition fails to improve as anticipated.  I provided 15  minutes of non-face-to-face time during this encounter.   Antony Blackbird, MD

## 2019-02-20 NOTE — Progress Notes (Signed)
States that his GERD is well controlled as long as he has the Protonix. Needs refills.

## 2019-02-24 ENCOUNTER — Other Ambulatory Visit (HOSPITAL_COMMUNITY): Payer: Self-pay | Admitting: Cardiology

## 2019-02-24 MED ORDER — SERTRALINE HCL 50 MG PO TABS: ORAL_TABLET | ORAL | 0 refills | Status: DC

## 2019-02-24 NOTE — Telephone Encounter (Signed)
Patient called to request a refill on zoloft  Advised this is not a medication we would continue in the future however we could refill for 30 days until he is able to iron out script with his primary care physician and they would need to take over from there. Patient voiced understanding

## 2019-03-05 ENCOUNTER — Other Ambulatory Visit (HOSPITAL_COMMUNITY): Payer: Self-pay | Admitting: Internal Medicine

## 2019-03-30 ENCOUNTER — Other Ambulatory Visit (HOSPITAL_COMMUNITY): Payer: Self-pay | Admitting: Internal Medicine

## 2019-03-30 ENCOUNTER — Telehealth: Payer: Self-pay | Admitting: Family Medicine

## 2019-03-30 NOTE — Telephone Encounter (Signed)
1) Medication(s) Requested (by name): -sertraline (ZOLOFT) 50 MG tablet  -hydrOXYzine (ATARAX/VISTARIL) 25 MG tablet   2) Pharmacy of Choice: -[facility] [address] [place] [address]  3) Special Requests: -Cardiologist wants pcp to fill these medications from now on

## 2019-03-31 ENCOUNTER — Other Ambulatory Visit: Payer: Self-pay | Admitting: Family Medicine

## 2019-03-31 MED ORDER — HYDROXYZINE HCL 25 MG PO TABS: ORAL_TABLET | ORAL | 0 refills | Status: DC

## 2019-03-31 MED ORDER — SERTRALINE HCL 50 MG PO TABS: ORAL_TABLET | ORAL | 0 refills | Status: DC

## 2019-03-31 NOTE — Progress Notes (Signed)
Patient ID: Austin Tran, male   DOB: 1986/02/11, 33 y.o.   MRN: [id number]   Patient left phone message that his cardiologist would like for his primary care physician to start refilling patient's sertraline and hydroxyzine.  We will send in new prescriptions.  Patient will need to follow-up with his new primary care provider at [facility] regarding future refills

## 2019-03-31 NOTE — Telephone Encounter (Signed)
LMOM

## 2019-03-31 NOTE — Telephone Encounter (Signed)
Notify patient that refills for sertraline and hydroxyzine were sent to [facility].  He can schedule follow-up at [facility] when refills needed

## 2019-03-31 NOTE — Telephone Encounter (Signed)
1) Medication(s) Requested (by name): °-sertraline (ZOLOFT) 50 MG tablet  °-hydrOXYzine (ATARAX/VISTARIL) 25 MG tablet  ° °2) Pharmacy of Choice: °-[facility] - [place], [place] - [address] ° °3) Special Requests: °-Cardiologist wants pcp to fill these medications from now on  ° °

## 2019-06-10 ENCOUNTER — Other Ambulatory Visit: Payer: Self-pay | Admitting: Student

## 2019-06-10 MED ORDER — FUROSEMIDE 40 MG PO TABS: ORAL_TABLET | ORAL | 0 refills | Status: DC

## 2019-06-10 MED ORDER — SPIRONOLACTONE 25 MG PO TABS: 25.0000 mg | ORAL_TABLET | Freq: Every day | ORAL | 0 refills | Status: DC

## 2019-06-10 MED ORDER — ATORVASTATIN CALCIUM 20 MG PO TABS: ORAL_TABLET | ORAL | 0 refills | Status: DC

## 2019-06-26 ENCOUNTER — Encounter: Payer: Self-pay | Admitting: Internal Medicine

## 2019-06-26 ENCOUNTER — Ambulatory Visit (INDEPENDENT_AMBULATORY_CARE_PROVIDER_SITE_OTHER): Payer: Self-pay | Admitting: Internal Medicine

## 2019-06-26 DIAGNOSIS — F322 Major depressive disorder, single episode, severe without psychotic features: Secondary | ICD-10-CM

## 2019-06-26 MED ORDER — SERTRALINE HCL 50 MG PO TABS: ORAL_TABLET | ORAL | 0 refills | Status: DC

## 2019-06-26 MED ORDER — HYDROXYZINE HCL 25 MG PO TABS: 25.0000 mg | ORAL_TABLET | Freq: Four times a day (QID) | ORAL | 0 refills | Status: AC | PRN

## 2019-06-26 NOTE — Progress Notes (Signed)
Virtual Visit via Telephone Note  I connected with Austin Tran, on 06/26/2019 at 3:36 PM by telephone due to the COVID-19 pandemic and verified that I am speaking with the correct person using two identifiers.   Consent: I discussed the limitations, risks, security and privacy concerns of performing an evaluation and management service by telephone and the availability of in person appointments. I also discussed with the patient that there may be a patient responsible charge related to this service. The patient expressed understanding and agreed to proceed.   Location of Patient: Home   Location of Provider: Clinic    Persons participating in Telemedicine visit: Tremell Reimers [facility] Dr. Earlene Plater      History of Present Illness: Patient has visit for f/u of depression. Takes Zoloft 50 mg daily and Hydroxyzine as needed. Reports only takes that a few times per week when has nightmares and can't fall back asleep. Feels like his mood is very stable, not having the mood swings he was having prior. Since Thanksgiving he has been staying with his [place] and that has helped his mood. Helped him have a routine and also gotten him so good home cooked meals.   Depression screen [facility] 2/9 06/26/2019 06/01/2018  Decreased Interest 2 3  Down, Depressed, Hopeless 2 2  PHQ - 2 Score 4 5  Altered sleeping 3 3  Tired, decreased energy 3 3  Change in appetite 0 0  Feeling bad or failure about yourself  3 3  Trouble concentrating 2 2  Moving slowly or fidgety/restless 1 1  Suicidal thoughts 0 0  PHQ-9 Score 16 17     Past Medical History:  Diagnosis Date  . ADHD (attention deficit hyperactivity disorder)    on adderall since 2004  . Chronic systolic (congestive) heart failure (HCC)   . GERD (gastroesophageal reflux disease)   . HLD (hyperlipidemia)   . Hypertension   . Stroke (HCC)   . Tobacco abuse    No Known Allergies  Current Outpatient Medications on File  Prior to Visit  Medication Sig Dispense Refill  . amLODipine (NORVASC) 2.5 MG tablet TAKE ONE TABLET BY MOUTH DAILY 90 tablet 4  . atorvastatin (LIPITOR) 20 MG tablet TAKE ONE TABLET BY MOUTH DAILY AT 6 PM 30 tablet 0  . carvedilol (COREG) 6.25 MG tablet TAKE ONE TABLET BY MOUTH TWICE A DAY 180 tablet 3  . colestipol (COLESTID) 1 g tablet Take 1 g by mouth 2 (two) times daily.    . furosemide (LASIX) 40 MG tablet TAKE ONE TABLET BY MOUTH DAILY AS NEEDED. PATIENT NEEDS TO SCHEDULE APPOINTMENT FOR FURTHER REFILLS 30 tablet 0  . hydrOXYzine (ATARAX/VISTARIL) 25 MG tablet TAKE ONE TABLET BY MOUTH EVERY 6 HOURS AS NEEDED FOR ANXIETY OR AGITATION OR CIWA </= 10; 90 day supply 90 tablet 0  . pantoprazole (PROTONIX) 40 MG tablet Take 1 tablet (40 mg total) by mouth daily. To reduce stomach acid 90 tablet 1  . potassium chloride SA (K-DUR,KLOR-CON) 20 MEQ tablet Take 1 tablet (20 mEq total) by mouth as needed. Take only when taking lasix 90 tablet 3  . sacubitril-valsartan (ENTRESTO) 97-103 MG Take 1 tablet by mouth 2 (two) times daily. 180 tablet 3  . sertraline (ZOLOFT) 50 MG tablet TAKE ONE TABLET BY MOUTH DAILY FOR MOOD 90 tablet 0  . spironolactone (ALDACTONE) 25 MG tablet Take 1 tablet (25 mg total) by mouth daily. 30 tablet 0   No current facility-administered  medications on file prior to visit.    Observations/Objective: NAD. Speaking clearly.  Work of breathing normal.  Alert and oriented. Mood appropriate.   Assessment and Plan: 1. MDD (major depressive disorder), severe ([place]) Patient reports he is doing well with mood. PHQ-9 score of 16 indicating moderate depression. Denies SI. No safety concerns.  - hydrOXYzine (ATARAX/VISTARIL) 25 MG tablet; Take 1 tablet (25 mg total) by mouth every 6 (six) hours as needed for anxiety (Agitation).  Dispense: 270 tablet; Refill: 0 - sertraline (ZOLOFT) 50 MG tablet; TAKE ONE TABLET BY MOUTH DAILY FOR MOOD  Dispense: 90 tablet; Refill: 0   Follow Up  Instructions: Follow up for annual exam    I discussed the assessment and treatment plan with the patient. The patient was provided an opportunity to ask questions and all were answered. The patient agreed with the plan and demonstrated an understanding of the instructions.   The patient was advised to call back or seek an in-person evaluation if the symptoms worsen or if the condition fails to improve as anticipated.     I provided 16 minutes total of non-face-to-face time during this encounter including median intraservice time, reviewing previous notes, investigations, ordering medications, medical decision making, coordinating care and patient verbalized understanding at the end of the visit.    Phill Myron, D.O. Primary Care at [facility]  06/26/2019, 3:36 PM

## 2019-07-05 ENCOUNTER — Encounter (HOSPITAL_COMMUNITY): Payer: Self-pay | Admitting: Internal Medicine

## 2019-07-10 ENCOUNTER — Encounter (HOSPITAL_COMMUNITY): Payer: Self-pay | Admitting: Internal Medicine

## 2019-07-10 ENCOUNTER — Other Ambulatory Visit: Payer: Self-pay

## 2019-07-10 ENCOUNTER — Ambulatory Visit (HOSPITAL_COMMUNITY)
Admission: RE | Admit: 2019-07-10 | Discharge: 2019-07-10 | Disposition: A | Discharge status: Home / Self Care | Payer: Self-pay | Source: Ambulatory Visit | Attending: Internal Medicine | Admitting: Internal Medicine

## 2019-07-10 VITALS — BP 110/70 | HR 109 | Wt 250.6 lb

## 2019-07-10 DIAGNOSIS — F909 Attention-deficit hyperactivity disorder, unspecified type: Secondary | ICD-10-CM

## 2019-07-10 DIAGNOSIS — F419 Anxiety disorder, unspecified: Secondary | ICD-10-CM | POA: Insufficient documentation

## 2019-07-10 DIAGNOSIS — Z79899 Other long term (current) drug therapy: Secondary | ICD-10-CM | POA: Insufficient documentation

## 2019-07-10 DIAGNOSIS — G4733 Obstructive sleep apnea (adult) (pediatric): Secondary | ICD-10-CM

## 2019-07-10 DIAGNOSIS — Z8673 Personal history of transient ischemic attack (TIA), and cerebral infarction without residual deficits: Secondary | ICD-10-CM | POA: Insufficient documentation

## 2019-07-10 DIAGNOSIS — E785 Hyperlipidemia, unspecified: Secondary | ICD-10-CM | POA: Insufficient documentation

## 2019-07-10 DIAGNOSIS — I110 Hypertensive heart disease with heart failure: Secondary | ICD-10-CM | POA: Insufficient documentation

## 2019-07-10 DIAGNOSIS — Z8249 Family history of ischemic heart disease and other diseases of the circulatory system: Secondary | ICD-10-CM | POA: Insufficient documentation

## 2019-07-10 DIAGNOSIS — K219 Gastro-esophageal reflux disease without esophagitis: Secondary | ICD-10-CM | POA: Insufficient documentation

## 2019-07-10 DIAGNOSIS — Z7901 Long term (current) use of anticoagulants: Secondary | ICD-10-CM | POA: Insufficient documentation

## 2019-07-10 DIAGNOSIS — I5022 Chronic systolic (congestive) heart failure: Secondary | ICD-10-CM

## 2019-07-10 DIAGNOSIS — I10 Essential (primary) hypertension: Secondary | ICD-10-CM

## 2019-07-10 DIAGNOSIS — Z87891 Personal history of nicotine dependence: Secondary | ICD-10-CM | POA: Insufficient documentation

## 2019-07-10 DIAGNOSIS — F329 Major depressive disorder, single episode, unspecified: Secondary | ICD-10-CM | POA: Insufficient documentation

## 2019-07-10 LAB — BASIC METABOLIC PANEL
Anion gap: 14 (ref 5–15)
BUN: 9 mg/dL (ref 6–20)
CO2: 21 mmol/L — ABNORMAL LOW (ref 22–32)
Calcium: 9.5 mg/dL (ref 8.9–10.3)
Chloride: 100 mmol/L (ref 98–111)
Creatinine, Ser: 0.78 mg/dL (ref 0.61–1.24)
GFR calc Af Amer: >60 mL/min (ref >60)
GFR calc non Af Amer: >60 mL/min (ref >60)
Glucose, Bld: 157 mg/dL — ABNORMAL HIGH (ref 70–99)
Potassium: 3.5 mmol/L (ref 3.5–5.1)
Sodium: 135 mmol/L (ref 135–145)

## 2019-07-10 LAB — BRAIN NATRIURETIC PEPTIDE: B Natriuretic Peptide: 22.4 pg/mL (ref 0.0–100.0)

## 2019-07-10 LAB — CBC
HCT: 47.2 % (ref 39.0–52.0)
Hemoglobin: 15.7 g/dL (ref 13.0–17.0)
MCH: 29.5 pg (ref 26.0–34.0)
MCHC: 33.3 g/dL (ref 30.0–36.0)
MCV: 88.7 fL (ref 80.0–100.0)
Platelets: 236 10*3/uL (ref 150–400)
RBC: 5.32 MIL/uL (ref 4.22–5.81)
RDW: 12.2 % (ref 11.5–15.5)
WBC: 10.2 10*3/uL (ref 4.0–10.5)
nRBC: 0 % (ref 0.0–0.2)

## 2019-07-10 LAB — TSH: TSH: 2.184 u[IU]/mL (ref 0.350–4.500)

## 2019-07-10 NOTE — Patient Instructions (Signed)
Labs done today, your results will be available in MyChart, we will contact you for abnormal readings.  Your physician has requested that you have an echocardiogram. Echocardiography is a painless test that uses sound waves to create images of your heart. It provides your doctor with information about the size and shape of your heart and how well your heart's chambers and valves are working. This procedure takes approximately one hour. There are no restrictions for this procedure.  Please call our office in August to schedule your 6 month appointment  If you have any questions or concerns before your next appointment please send [place] a message through [place] or call our office at [phone].  At the [facility], you and your health needs are our priority. As part of our continuing mission to provide you with exceptional heart care, we have created designated Provider Care Teams. These Care Teams include your primary Cardiologist (physician) and Advanced Practice Providers (APPs- Physician Assistants and Nurse Practitioners) who all work together to provide you with the care you need, when you need it.   You may see any of the following providers on your designated Care Team at your next follow up: Marland Kitchen Dr Arvilla Meres . Dr Marca Ancona . Tonye Becket, NP . Robbie Lis, PA . Karle Plumber, PharmD   Please be sure to bring in all your medications bottles to every appointment.

## 2019-07-10 NOTE — Addendum Note (Signed)
Encounter addended by: Noralee Space, RN on: 07/10/2019 3:45 PM  Actions taken: Order list changed, Diagnosis association updated, Clinical Note Signed, Charge Capture section accepted

## 2019-07-10 NOTE — Progress Notes (Signed)
[facility] Note     Primary Cardiologist: Austin Tran Primary HF: Dr. 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 Drinks 4-5 beers a week.   In June 9233, cardioembolic stroke. Echocardiogram obtained on 11/21/2015 showed EF 20-25%, severe dilatation of the left ventricle, mild Austin, moderate left atrial enlargement, moderate RV enlargement with reduced RV function. Cardiology was consulted for TEE in the setting of stroke and also LV dysfunction. TEE obtained on 11/22/2015 showed severe LV dysfunction with EF 15%, severe RV dysfunction.  Echo 12/17/15 showed EF dropped from 20-25% to 10% over 1 month. 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   Initial Coox 31% consistent with severe cardiogenic shock. Started on milrinone 0.375 mcg with coox up to 74%. BB stopped. Brisk diuresis with IV lasix after addition of inotropes. Milrinone weaned and medications adjusted as tolerated. Losartan switched to Entresto. He was successfully weaned off milrinone completely and remained stable symptomatically for discharge. Overall he diuresed 12.2 L and down 15 lbs from admission.   Here for routine f/u. Struggling with anxiety and depression. Says he is unmotivated and sleeps a lot. Very fatigued. Not following psychiatrist currently. Denies CP or SOB. No edema.  Has gained 30 pounds due to inactivity.   cMRI 12/19/15 1. Severe LV dilation with EF 16%, diffuse hypokinesis. 2. Mild RV dilation, moderately decreased RV systolic function. 3. LGE in the basal to mid inferoseptal RV insertion site. This is a nonspecific finding and often suggests volume overload/LV strain   Echo 03/25/16  EF 25-30% (up from 15%)  Echo 4/18: EF 45-50%  Echo 07/2017: EF 55-60% (Personally reviewed by Dr Austin Tran)    Past Medical History:  Diagnosis Date  . ADHD  (attention deficit hyperactivity disorder)    on adderall since 2004  . Chronic systolic (congestive) heart failure ([place])   . GERD (gastroesophageal reflux disease)   . HLD (hyperlipidemia)   . Hypertension   . Stroke ([place])   . Tobacco abuse     Current Outpatient Medications  Medication Sig Dispense Refill  . amLODipine (NORVASC) 2.5 MG tablet TAKE ONE TABLET BY MOUTH DAILY 90 tablet 4  . atorvastatin (LIPITOR) 20 MG tablet TAKE ONE TABLET BY MOUTH DAILY AT 6 PM 30 tablet 0  . carvedilol (COREG) 6.25 MG tablet TAKE ONE TABLET BY MOUTH TWICE A DAY 180 tablet 3  . colestipol (COLESTID) 1 g tablet Take 1 g by mouth 2 (two) times daily.    . furosemide (LASIX) 40 MG tablet TAKE ONE TABLET BY MOUTH DAILY AS NEEDED. PATIENT NEEDS TO SCHEDULE APPOINTMENT FOR FURTHER REFILLS 30 tablet 0  . hydrOXYzine (ATARAX/VISTARIL) 25 MG tablet Take 1 tablet (25 mg total) by mouth every 6 (six) hours as needed for anxiety (Agitation). 270 tablet 0  . pantoprazole (PROTONIX) 40 MG tablet Take 1 tablet (40 mg total) by mouth daily. To reduce stomach acid 90 tablet 1  . potassium chloride SA (K-DUR,KLOR-CON) 20 MEQ tablet Take 1 tablet (20 mEq total) by mouth as needed. Take only when taking lasix 90 tablet 3  . sacubitril-valsartan (ENTRESTO) 97-103 MG Take 1 tablet by mouth 2 (two) times daily. 180 tablet 3  . sertraline (ZOLOFT) 50 MG tablet TAKE ONE TABLET BY MOUTH DAILY FOR MOOD 90 tablet 0  . spironolactone (ALDACTONE) 25 MG tablet Take 1 tablet (25  mg total) by mouth daily. 30 tablet 0   No current facility-administered medications for this encounter.    No Known Allergies    Social History   Socioeconomic History  . Marital status: Single    Spouse name: Not on file  . Number of children: Not on file  . Years of education: Not on file  . Highest education level: Not on file  Occupational History  . Occupation: Unemployed  Tobacco Use  . Smoking status: Former Smoker    Types: Cigarettes     Quit date: 11/23/2015    Years since quitting: 3.6  . Smokeless tobacco: Never Used  Substance and Sexual Activity  . Alcohol use: Yes    Alcohol/week: 3.0 standard drinks    Types: 3 Cans of beer per week    Comment: 3 beers per day; 3 x a week  . Drug use: No  . Sexual activity: Not Currently  Other Topics Concern  . Not on file  Social History Narrative   Client lives alone; he is unemployed.  He sees Dr. Jannifer Tran.   Social Determinants of Health   Financial Resource Strain:   . Difficulty of Paying Living Expenses: Not on file  Food Insecurity:   . Worried About Programme researcher, broadcasting/film/video in the Last Year: Not on file  . Ran Out of Food in the Last Year: Not on file  Transportation Needs:   . Lack of Transportation (Medical): Not on file  . Lack of Transportation (Non-Medical): Not on file  Physical Activity:   . Days of Exercise per Week: Not on file  . Minutes of Exercise per Session: Not on file  Stress:   . Feeling of Stress : Not on file  Social Connections:   . Frequency of Communication with Friends and Family: Not on file  . Frequency of Social Gatherings with Friends and Family: Not on file  . Attends Religious Services: Not on file  . Active Member of Clubs or Organizations: Not on file  . Attends Banker Meetings: Not on file  . Marital Status: Not on file  Intimate Partner Violence:   . Fear of Current or Ex-Partner: Not on file  . Emotionally Abused: Not on file  . Physically Abused: Not on file  . Sexually Abused: Not on file      Family History  Problem Relation Age of Onset  . Hypertension Mother   . Hypertension Father   . Other Maternal Grandmother        multiple conditions     Vitals:   07/10/19 1510  BP: 110/70  Pulse: (!) 109  SpO2: 96%  Weight: 113.7 kg (250 lb 9.6 oz)   Wt Readings from Last 3 Encounters:  07/10/19 113.7 kg (250 lb 9.6 oz)  08/03/18 98.9 kg (218 lb)  06/29/18 99.9 kg (220 lb 3.2 oz)    PHYSICAL  EXAM: General:  Well appearing. No resp difficulty HEENT: normal Neck: supple. no JVD. Carotids 2+ bilat; no bruits. No lymphadenopathy or thryomegaly appreciated. Cor: PMI nondisplaced. Mildly tachy. No rubs, gallops or murmurs. Lungs: clear Abdomen: soft, nontender, nondistended. No hepatosplenomegaly. No bruits or masses. Good bowel sounds. Extremities: no cyanosis, clubbing, rash, edema Neuro: alert & orientedx3, cranial nerves grossly intact. moves all 4 extremities w/o difficulty. Affect flat   ASSESSMENT & PLAN:  1. 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 EF 25-30% on echo 03/25/16 - Echo 3/19 EF 55-60%.  - Volume  status looks good.  - Continue Entresto to 97/103 bid.  - Continue lasix 40 mg daily.  - Continue carvedilol to 6.25 bid  - Continue spiro 25 mg daily. - Will repeat echo  2. H/o cardioembolic stroke 10/2015 on Xarelto - Off Xarelto with normalization of EF. No change 3. ADHD/Anxiety - Off all stimulants. Now with persistent severe anxiety and depression. - We discussed to pros and cons of a cautious trial of re-introducing low-dose stimulants with close f/u of his EF. Given the severity of his ADHD/anxiety/depression I think the benefits of restarting a stimulant outweigh the risk. He will d/w his psychiatrist and we will coordinate.  4. Former Smoker  - Denies tobacco use 5. OSA on CPAP - Continue CPAP 6. HTN -Blood pressure well controlled. Continue current regimen.   Arvilla Meres, MD 3:24 PM

## 2019-07-11 ENCOUNTER — Other Ambulatory Visit (HOSPITAL_COMMUNITY): Payer: Self-pay | Admitting: Internal Medicine

## 2019-07-24 ENCOUNTER — Ambulatory Visit (HOSPITAL_COMMUNITY)
Admission: RE | Admit: 2019-07-24 | Discharge: 2019-07-24 | Disposition: A | Discharge status: Home / Self Care | Payer: 59 | Source: Ambulatory Visit | Attending: Internal Medicine | Admitting: Internal Medicine

## 2019-07-24 ENCOUNTER — Other Ambulatory Visit: Payer: Self-pay

## 2019-07-24 ENCOUNTER — Other Ambulatory Visit (HOSPITAL_COMMUNITY): Payer: Self-pay

## 2019-07-24 DIAGNOSIS — I110 Hypertensive heart disease with heart failure: Secondary | ICD-10-CM | POA: Insufficient documentation

## 2019-07-24 DIAGNOSIS — E785 Hyperlipidemia, unspecified: Secondary | ICD-10-CM | POA: Insufficient documentation

## 2019-07-24 DIAGNOSIS — Z8673 Personal history of transient ischemic attack (TIA), and cerebral infarction without residual deficits: Secondary | ICD-10-CM | POA: Insufficient documentation

## 2019-07-24 DIAGNOSIS — I5022 Chronic systolic (congestive) heart failure: Secondary | ICD-10-CM | POA: Diagnosis present

## 2019-07-24 MED ORDER — POTASSIUM CHLORIDE CRYS ER 20 MEQ PO TBCR: 20.0000 meq | EXTENDED_RELEASE_TABLET | ORAL | 5 refills | Status: DC | PRN

## 2019-07-24 NOTE — Progress Notes (Signed)
  Echocardiogram 2D Echocardiogram has been performed.  Austin Tran 07/24/2019, 3:40 PM

## 2019-07-25 MED ORDER — POTASSIUM CHLORIDE CRYS ER 20 MEQ PO TBCR: 20.0000 meq | EXTENDED_RELEASE_TABLET | ORAL | 5 refills | Status: DC

## 2019-07-25 NOTE — Addendum Note (Signed)
Addended by: Marisa Hua on: 07/25/2019 03:21 PM   Modules accepted: Orders

## 2019-07-26 ENCOUNTER — Telehealth (HOSPITAL_COMMUNITY): Payer: Self-pay | Admitting: Cardiology

## 2019-07-26 NOTE — Telephone Encounter (Signed)
Pt aware and voiced understanding 

## 2019-07-26 NOTE — Telephone Encounter (Signed)
Patient called to request echo results   [facility]

## 2019-08-21 ENCOUNTER — Other Ambulatory Visit: Payer: Self-pay | Admitting: Family Medicine

## 2019-08-21 DIAGNOSIS — K219 Gastro-esophageal reflux disease without esophagitis: Secondary | ICD-10-CM

## 2019-09-25 ENCOUNTER — Other Ambulatory Visit: Payer: Self-pay | Admitting: Family Medicine

## 2019-09-25 DIAGNOSIS — F322 Major depressive disorder, single episode, severe without psychotic features: Secondary | ICD-10-CM

## 2019-10-03 ENCOUNTER — Telehealth (HOSPITAL_COMMUNITY): Payer: Self-pay

## 2019-10-03 NOTE — Telephone Encounter (Signed)
Pt called requesting med recs to be sent to [facility].  Received med rec release. Med records faxed with confirmation of receipt.

## 2019-10-11 ENCOUNTER — Other Ambulatory Visit: Payer: Self-pay | Admitting: Family Medicine

## 2019-10-11 ENCOUNTER — Other Ambulatory Visit (HOSPITAL_COMMUNITY): Payer: Self-pay | Admitting: Internal Medicine

## 2019-10-11 DIAGNOSIS — F322 Major depressive disorder, single episode, severe without psychotic features: Secondary | ICD-10-CM

## 2019-11-14 ENCOUNTER — Other Ambulatory Visit (HOSPITAL_COMMUNITY): Payer: Self-pay | Admitting: *Deleted

## 2019-11-14 MED ORDER — CARVEDILOL 6.25 MG PO TABS: 6.2500 mg | ORAL_TABLET | Freq: Two times a day (BID) | ORAL | 0 refills | Status: DC

## 2019-11-15 ENCOUNTER — Telehealth: Payer: Self-pay | Admitting: Internal Medicine

## 2019-11-15 DIAGNOSIS — K219 Gastro-esophageal reflux disease without esophagitis: Secondary | ICD-10-CM

## 2019-11-15 MED ORDER — PANTOPRAZOLE SODIUM 40 MG PO TBEC: 40.0000 mg | DELAYED_RELEASE_TABLET | Freq: Every day | ORAL | 0 refills | Status: DC

## 2019-11-15 NOTE — Telephone Encounter (Signed)
1) Medication(s) Requested (by name):pantoprazole (PROTONIX) 40    2) Pharmacy of Choice:[facility] [address], [place] - [address]  [address],   3) Special Requests:   Approved medications will be sent to the pharmacy, we will reach out if there is an issue.  Requests made after 3pm may not be addressed until the following business day!  If a patient is unsure of the name of the medication(s) please note and ask patient to call back when they are able to provide all info, do not send to responsible party until all information is available!

## 2019-12-20 ENCOUNTER — Other Ambulatory Visit (HOSPITAL_COMMUNITY): Payer: Self-pay

## 2019-12-20 MED ORDER — AMLODIPINE BESYLATE 2.5 MG PO TABS: 2.5000 mg | ORAL_TABLET | Freq: Every day | ORAL | 2 refills | Status: DC

## 2019-12-28 ENCOUNTER — Other Ambulatory Visit (HOSPITAL_COMMUNITY): Payer: Self-pay | Admitting: Surgery

## 2019-12-29 ENCOUNTER — Telehealth (HOSPITAL_COMMUNITY): Payer: Self-pay | Admitting: Pharmacy Technician

## 2019-12-29 NOTE — Telephone Encounter (Signed)
It's time to re-enroll patient to receive medication assistance for Entresto from [employer]. Left voicemail for patient to call me back so that we can start the re-enrollment process.  Will follow up.    

## 2020-01-01 ENCOUNTER — Other Ambulatory Visit (HOSPITAL_COMMUNITY): Payer: Self-pay

## 2020-01-01 MED ORDER — ENTRESTO 97-103 MG PO TABS: 1.0000 | ORAL_TABLET | Freq: Two times a day (BID) | ORAL | 3 refills | Status: DC

## 2020-01-21 ENCOUNTER — Other Ambulatory Visit: Payer: Self-pay | Admitting: Internal Medicine

## 2020-01-22 ENCOUNTER — Other Ambulatory Visit (HOSPITAL_COMMUNITY): Payer: Self-pay

## 2020-01-22 MED ORDER — FUROSEMIDE 40 MG PO TABS: 40.0000 mg | ORAL_TABLET | Freq: Every day | ORAL | 0 refills | Status: DC | PRN

## 2020-01-22 MED ORDER — ATORVASTATIN CALCIUM 20 MG PO TABS: 20.0000 mg | ORAL_TABLET | Freq: Every evening | ORAL | 3 refills | Status: DC

## 2020-01-25 NOTE — Telephone Encounter (Signed)
Called and left the patient another message about his renewal.  Will be here to assist in the future as needed.  Archer Asa, CPhT

## 2020-01-30 ENCOUNTER — Telehealth (HOSPITAL_COMMUNITY): Payer: Self-pay | Admitting: Pharmacy Technician

## 2020-01-30 ENCOUNTER — Ambulatory Visit: Payer: 59 | Admitting: Internal Medicine

## 2020-01-30 NOTE — Telephone Encounter (Signed)
Spoke to patient regarding re-enrollment of Entresto assistance with [employer]. Will email the application for the patient to sign. If he cannot e-sign, I will have a copy at the check in desk for him to sign when he comes to his appointment at the end of the month.  Will follow up.

## 2020-01-31 ENCOUNTER — Ambulatory Visit: Payer: 59 | Admitting: Internal Medicine

## 2020-02-01 ENCOUNTER — Ambulatory Visit (HOSPITAL_COMMUNITY): Payer: Self-pay

## 2020-02-01 ENCOUNTER — Encounter (HOSPITAL_COMMUNITY): Payer: 59 | Admitting: Internal Medicine

## 2020-02-22 ENCOUNTER — Ambulatory Visit (HOSPITAL_BASED_OUTPATIENT_CLINIC_OR_DEPARTMENT_OTHER)
Admission: RE | Admit: 2020-02-22 | Discharge: 2020-02-22 | Disposition: A | Discharge status: Home / Self Care | Payer: Self-pay | Source: Ambulatory Visit | Attending: Cardiology | Admitting: Cardiology

## 2020-02-22 ENCOUNTER — Ambulatory Visit (HOSPITAL_COMMUNITY)
Admission: RE | Admit: 2020-02-22 | Discharge: 2020-02-22 | Disposition: A | Discharge status: Home / Self Care | Payer: Self-pay | Attending: Internal Medicine | Admitting: Internal Medicine

## 2020-02-22 ENCOUNTER — Other Ambulatory Visit: Payer: Self-pay

## 2020-02-22 ENCOUNTER — Encounter (HOSPITAL_COMMUNITY): Payer: Self-pay

## 2020-02-22 VITALS — BP 125/85 | HR 90 | Wt 254.2 lb

## 2020-02-22 DIAGNOSIS — Z7901 Long term (current) use of anticoagulants: Secondary | ICD-10-CM | POA: Insufficient documentation

## 2020-02-22 DIAGNOSIS — Z79899 Other long term (current) drug therapy: Secondary | ICD-10-CM | POA: Insufficient documentation

## 2020-02-22 DIAGNOSIS — Z8673 Personal history of transient ischemic attack (TIA), and cerebral infarction without residual deficits: Secondary | ICD-10-CM | POA: Insufficient documentation

## 2020-02-22 DIAGNOSIS — Z560 Unemployment, unspecified: Secondary | ICD-10-CM | POA: Insufficient documentation

## 2020-02-22 DIAGNOSIS — F419 Anxiety disorder, unspecified: Secondary | ICD-10-CM | POA: Insufficient documentation

## 2020-02-22 DIAGNOSIS — E785 Hyperlipidemia, unspecified: Secondary | ICD-10-CM | POA: Insufficient documentation

## 2020-02-22 DIAGNOSIS — Z87891 Personal history of nicotine dependence: Secondary | ICD-10-CM | POA: Insufficient documentation

## 2020-02-22 DIAGNOSIS — F909 Attention-deficit hyperactivity disorder, unspecified type: Secondary | ICD-10-CM | POA: Insufficient documentation

## 2020-02-22 DIAGNOSIS — Z8249 Family history of ischemic heart disease and other diseases of the circulatory system: Secondary | ICD-10-CM | POA: Insufficient documentation

## 2020-02-22 DIAGNOSIS — I110 Hypertensive heart disease with heart failure: Secondary | ICD-10-CM | POA: Insufficient documentation

## 2020-02-22 DIAGNOSIS — I5022 Chronic systolic (congestive) heart failure: Secondary | ICD-10-CM | POA: Insufficient documentation

## 2020-02-22 DIAGNOSIS — G4733 Obstructive sleep apnea (adult) (pediatric): Secondary | ICD-10-CM | POA: Insufficient documentation

## 2020-02-22 DIAGNOSIS — K219 Gastro-esophageal reflux disease without esophagitis: Secondary | ICD-10-CM | POA: Insufficient documentation

## 2020-02-22 LAB — BASIC METABOLIC PANEL
Anion gap: 15 (ref 5–15)
BUN: 13 mg/dL (ref 6–20)
CO2: 21 mmol/L — ABNORMAL LOW (ref 22–32)
Calcium: 9.4 mg/dL (ref 8.9–10.3)
Chloride: 96 mmol/L — ABNORMAL LOW (ref 98–111)
Creatinine, Ser: 0.98 mg/dL (ref 0.61–1.24)
GFR calc Af Amer: >60 mL/min (ref >60)
GFR calc non Af Amer: >60 mL/min (ref >60)
Glucose, Bld: 111 mg/dL — ABNORMAL HIGH (ref 70–99)
Potassium: 3.1 mmol/L — ABNORMAL LOW (ref 3.5–5.1)
Sodium: 132 mmol/L — ABNORMAL LOW (ref 135–145)

## 2020-02-22 LAB — ECHOCARDIOGRAM COMPLETE
Area-P 1/2: 3.23 cm2
S' Lateral: 3.7 cm

## 2020-02-22 NOTE — Progress Notes (Signed)
Advanced Heart Failure Clinic Note     Primary Cardiologist: Nishan Primary HF: Dr. Gala Romney   HPI:  Mr Colvin is a 34 year old with a history of ADHD, CVA cardioembolic June 2017, chronic systolic heart failure diagnosed June 2017, and smoker. Drinks 4-5 beers a week.   In June 2017, cardioembolic stroke. Echocardiogram obtained on 11/21/2015 showed EF 20-25%, severe dilatation of the left ventricle, mild MR, moderate left atrial enlargement, moderate RV enlargement with reduced RV function. Cardiology was consulted for TEE in the setting of stroke and also LV dysfunction. TEE obtained on 11/22/2015 showed severe LV dysfunction with EF 15%, severe RV dysfunction.  Echo 12/17/15 showed EF dropped from 20-25% to 10% over 1 month. 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   Initial Coox 31% consistent with severe cardiogenic shock. Started on milrinone 0.375 mcg with coox up to 74%. BB stopped. Brisk diuresis with IV lasix after addition of inotropes. Milrinone weaned and medications adjusted as tolerated. Losartan switched to Entresto. He was successfully weaned off milrinone completely and remained stable symptomatically for discharge. Overall he diuresed 12.2 L and down 15 lbs from admission.   Echo 3/19 showed EF had normalized to 55-60%.   Last seen by Dr. Gala Romney 2/21 and was stable from HF standpoint at that time. Echo repeated 3/21 and EF was normal at 50-55%.   He returns to clinic today for f/u. Doing ok. Denies dyspnea w/ basic ADLs. No LEE, orthopnea/PND. Reports full med compliance. No intolerances. Reports full compliance w/ CPAP.    cMRI 12/19/15 1. Severe LV dilation with EF 16%, diffuse hypokinesis. 2. Mild RV dilation, moderately decreased RV systolic function. 3. LGE in the basal to mid inferoseptal RV insertion site. This is a nonspecific finding and often suggests volume overload/LV strain   Echo 03/25/16  EF 25-30% (up from 15%)  Echo 4/18:  EF 45-50%  Echo 07/2017: EF 55-60% (Personally reviewed by Dr Gala Romney) Echo 3/21: EF 50-55%. RV normal     Past Medical History:  Diagnosis Date  . ADHD (attention deficit hyperactivity disorder)    on adderall since 2004  . Chronic systolic (congestive) heart failure (HCC)   . GERD (gastroesophageal reflux disease)   . HLD (hyperlipidemia)   . Hypertension   . Stroke (HCC)   . Tobacco abuse     Current Outpatient Medications  Medication Sig Dispense Refill  . amLODipine (NORVASC) 2.5 MG tablet Take 1 tablet (2.5 mg total) by mouth daily. 30 tablet 2  . amphetamine-dextroamphetamine (ADDERALL) 10 MG tablet Take 10 mg by mouth every morning.    Marland Kitchen atorvastatin (LIPITOR) 20 MG tablet Take 1 tablet (20 mg total) by mouth every evening. TAKE ONE TABLET BY MOUTH DAILY AT 6:00 IN THE EVENING 90 tablet 3  . carvedilol (COREG) 6.25 MG tablet Take 1 tablet (6.25 mg total) by mouth 2 (two) times daily. 180 tablet 0  . furosemide (LASIX) 40 MG tablet Take 1 tablet (40 mg total) by mouth daily as needed. 90 tablet 0  . hydrOXYzine (ATARAX/VISTARIL) 25 MG tablet Take 1 tablet (25 mg total) by mouth every 6 (six) hours as needed for anxiety (Agitation). 270 tablet 0  . pantoprazole (PROTONIX) 40 MG tablet Take 1 tablet (40 mg total) by mouth daily. 90 tablet 0  . potassium chloride SA (KLOR-CON) 20 MEQ tablet Take 1 tablet (20 mEq total) by mouth as directed. Take 1 tab daily if needed.  Take only when taking lasix  15 tablet 5  . sacubitril-valsartan (ENTRESTO) 97-103 MG Take 1 tablet by mouth 2 (two) times daily. 180 tablet 3  . sertraline (ZOLOFT) 100 MG tablet Take 100 mg by mouth daily.    Marland Kitchen spironolactone (ALDACTONE) 25 MG tablet TAKE ONE TABLET BY MOUTH ONE TIME DAILY 30 tablet 0  . traZODone (DESYREL) 50 MG tablet Take 25-50 mg by mouth at bedtime.     No current facility-administered medications for this encounter.    No Known Allergies    Social History   Socioeconomic History  .  Marital status: Single    Spouse name: Not on file  . Number of children: Not on file  . Years of education: Not on file  . Highest education level: Not on file  Occupational History  . Occupation: Unemployed  Tobacco Use  . Smoking status: Former Smoker    Types: Cigarettes    Quit date: 11/23/2015    Years since quitting: 4.2  . Smokeless tobacco: Never Used  Vaping Use  . Vaping Use: Never used  Substance and Sexual Activity  . Alcohol use: Yes    Alcohol/week: 3.0 standard drinks    Types: 3 Cans of beer per week    Comment: 3 beers per day; 3 x a week  . Drug use: No  . Sexual activity: Not Currently  Other Topics Concern  . Not on file  Social History Narrative   Client lives alone; he is unemployed.  He sees Dr. Jannifer Franklin.   Social Determinants of Health   Financial Resource Strain:   . Difficulty of Paying Living Expenses: Not on file  Food Insecurity:   . Worried About Programme researcher, broadcasting/film/video in the Last Year: Not on file  . Ran Out of Food in the Last Year: Not on file  Transportation Needs:   . Lack of Transportation (Medical): Not on file  . Lack of Transportation (Non-Medical): Not on file  Physical Activity:   . Days of Exercise per Week: Not on file  . Minutes of Exercise per Session: Not on file  Stress:   . Feeling of Stress : Not on file  Social Connections:   . Frequency of Communication with Friends and Family: Not on file  . Frequency of Social Gatherings with Friends and Family: Not on file  . Attends Religious Services: Not on file  . Active Member of Clubs or Organizations: Not on file  . Attends Banker Meetings: Not on file  . Marital Status: Not on file  Intimate Partner Violence:   . Fear of Current or Ex-Partner: Not on file  . Emotionally Abused: Not on file  . Physically Abused: Not on file  . Sexually Abused: Not on file      Family History  Problem Relation Age of Onset  . Hypertension Mother   . Hypertension Father     . Other Maternal Grandmother        multiple conditions     Vitals:   02/22/20 0846  BP: 125/85  Pulse: 90  SpO2: 96%  Weight: 115.3 kg (254 lb 3.2 oz)   Wt Readings from Last 3 Encounters:  02/22/20 115.3 kg (254 lb 3.2 oz)  07/10/19 113.7 kg (250 lb 9.6 oz)  08/03/18 98.9 kg (218 lb)    PHYSICAL EXAM: General:  Well appearing, moderately obese. No respiratory difficulty HEENT: normal Neck: supple. no JVD. Carotids 2+ bilat; no bruits. No lymphadenopathy or thyromegaly appreciated. Cor: PMI nondisplaced. Regular rate &  rhythm. No rubs, gallops or murmurs. Lungs: clear Abdomen: soft, nontender, nondistended. No hepatosplenomegaly. No bruits or masses. Good bowel sounds. Extremities: no cyanosis, clubbing, rash, edema Neuro: alert & oriented x 3, cranial nerves grossly intact. moves all 4 extremities w/o difficulty. Affect pleasant.    ASSESSMENT & PLAN:  1. Chronic Systolic HF  - EF initially 10% likely due to NICM.  - EF 25-30% on echo 03/25/16 - Echo 3/19 EF 55-60%.  - Echo 3/21 EF 50-55% - NYHA Class I-II. Volume status ok  - Continue Entresto to 97/103 bid.  - Continue lasix 40 mg daily.  - Continue carvedilol to 6.25 bid  - Continue spiro 25 mg daily. - Check BMP today  - Echo repeated today. MD interpretation pending   2. H/o cardioembolic stroke 10/2015 on Xarelto - Off Xarelto with normalization of EF. No change  3. ADHD/Anxiety  - on adderal   4. Former Smoker  - Denies recent tobacco use  5. OSA on CPAP - reports full compliance w/  CPAP. Continue qhs  6. HTN -Controlled on current regimen. -Check BMP today    F/u in 6 months   Holy Battenfield, PA-C 8:50 AM

## 2020-02-22 NOTE — Patient Instructions (Signed)
It was great to see you today! No medication changes are needed at this time.  Labs today We will only contact you if something comes back abnormal or we need to make some changes. Otherwise no news is good news!  Your physician recommends that you schedule a follow-up appointment in: 6 months with Dr Bensimhon  If you have any questions or concerns before your next appointment please send us a message through mychart or call our office at [phone].    TO LEAVE A MESSAGE FOR THE NURSE SELECT OPTION 2, PLEASE LEAVE A MESSAGE INCLUDING: . YOUR NAME . DATE OF BIRTH . CALL BACK NUMBER . REASON FOR CALL**this is important as we prioritize the call backs  YOU WILL RECEIVE A CALL BACK THE SAME DAY AS LONG AS YOU CALL BEFORE 4:00 PM   

## 2020-02-22 NOTE — Progress Notes (Signed)
  Echocardiogram 2D Echocardiogram has been performed.  Celene Skeen 02/22/2020, 8:56 AM

## 2020-02-23 NOTE — Telephone Encounter (Signed)
Sent in application via fax.  Will follow up.  

## 2020-02-26 ENCOUNTER — Telehealth: Payer: Self-pay | Admitting: Internal Medicine

## 2020-02-26 ENCOUNTER — Telehealth (HOSPITAL_COMMUNITY): Payer: Self-pay

## 2020-02-26 ENCOUNTER — Other Ambulatory Visit (HOSPITAL_COMMUNITY): Payer: Self-pay | Admitting: Internal Medicine

## 2020-02-26 MED ORDER — POTASSIUM CHLORIDE CRYS ER 20 MEQ PO TBCR: 20.0000 meq | EXTENDED_RELEASE_TABLET | Freq: Every day | ORAL | 3 refills | Status: AC

## 2020-02-26 NOTE — Telephone Encounter (Signed)
Samara Snide, RN  02/26/2020 2:44 PM EDT Back to Top    Patient advised and verbalized understanding. Med list updated. Pt wants to call back to schedule lab appt

## 2020-02-26 NOTE — Telephone Encounter (Signed)
-----   Message from [place], [place] sent at 02/22/2020  5:05 PM EDT ----- Renal function normal. K is  low at 3.1. add KCl 20 meq daily. Repeat BMP in 7-10 days

## 2020-02-26 NOTE — Telephone Encounter (Signed)
1) Medication(s) Requested (by name):pantoprazole (PROTONIX) 40 MG tablet [[id number]]    2) Pharmacy of Choice: [facility] [address] - [place], [place] - [address]  [address], [place] [address]  Phone:  [phone] Fax:  [phone]  DEA #:  --       3) Special Requests:   Approved medications will be sent to the pharmacy, we will reach out if there is an issue.  Requests made after 3pm may not be addressed until the following business day!  If a patient is unsure of the name of the medication(s) please note and ask patient to call back when they are able to provide all info, do not send to responsible party until all information is available!

## 2020-02-27 ENCOUNTER — Other Ambulatory Visit: Payer: Self-pay

## 2020-02-27 ENCOUNTER — Other Ambulatory Visit (HOSPITAL_COMMUNITY): Payer: Self-pay

## 2020-02-27 DIAGNOSIS — K219 Gastro-esophageal reflux disease without esophagitis: Secondary | ICD-10-CM

## 2020-02-27 MED ORDER — PANTOPRAZOLE SODIUM 40 MG PO TBEC: 40.0000 mg | DELAYED_RELEASE_TABLET | Freq: Every day | ORAL | 0 refills | Status: AC

## 2020-02-27 MED ORDER — CARVEDILOL 6.25 MG PO TABS: 6.2500 mg | ORAL_TABLET | Freq: Two times a day (BID) | ORAL | 0 refills | Status: DC

## 2020-02-27 NOTE — Telephone Encounter (Signed)
Advanced Heart Failure Patient Advocate Encounter   Patient was approved to receive Entresto from [employer]  Patient ID: [id number] Effective dates: 02/27/20 through 02/26/21

## 2020-02-27 NOTE — Telephone Encounter (Signed)
Called and left patient a message regarding approval.  Archer Asa, CPhT

## 2020-03-04 ENCOUNTER — Ambulatory Visit: Payer: Self-pay | Admitting: Internal Medicine

## 2020-03-17 ENCOUNTER — Other Ambulatory Visit (HOSPITAL_COMMUNITY): Payer: Self-pay | Admitting: Internal Medicine

## 2020-04-03 ENCOUNTER — Telehealth (HOSPITAL_COMMUNITY): Payer: Self-pay | Admitting: *Deleted

## 2020-04-03 NOTE — Telephone Encounter (Signed)
Patient left VM stating his therapist wanted to start him on Prazosin for nightmares but patient needed to make sure this was ok with his cardiac medications.  Routed to Dr.Bensimhon for advice

## 2020-04-03 NOTE — Telephone Encounter (Signed)
Spoke with pt.he is aware.

## 2020-04-03 NOTE — Telephone Encounter (Signed)
Ok by me

## 2020-06-02 ENCOUNTER — Other Ambulatory Visit (HOSPITAL_COMMUNITY): Payer: Self-pay | Admitting: Internal Medicine

## 2020-06-10 ENCOUNTER — Other Ambulatory Visit (HOSPITAL_COMMUNITY): Payer: Self-pay | Admitting: Internal Medicine

## 2020-07-28 ENCOUNTER — Other Ambulatory Visit (HOSPITAL_COMMUNITY): Payer: Self-pay | Admitting: Internal Medicine

## 2020-08-08 ENCOUNTER — Encounter (HOSPITAL_COMMUNITY): Payer: Self-pay | Admitting: Internal Medicine

## 2020-09-02 ENCOUNTER — Other Ambulatory Visit (HOSPITAL_COMMUNITY): Payer: Self-pay | Admitting: Internal Medicine

## 2020-09-04 ENCOUNTER — Other Ambulatory Visit (HOSPITAL_COMMUNITY): Payer: Self-pay

## 2020-09-04 MED ORDER — CARVEDILOL 6.25 MG PO TABS: 6.2500 mg | ORAL_TABLET | Freq: Two times a day (BID) | ORAL | 1 refills | Status: DC

## 2020-09-22 ENCOUNTER — Other Ambulatory Visit (HOSPITAL_COMMUNITY): Payer: Self-pay | Admitting: Internal Medicine

## 2020-09-24 ENCOUNTER — Other Ambulatory Visit (HOSPITAL_COMMUNITY): Payer: Self-pay | Admitting: Internal Medicine

## 2020-09-26 ENCOUNTER — Encounter (HOSPITAL_COMMUNITY): Payer: Self-pay | Admitting: Cardiology

## 2020-11-08 ENCOUNTER — Other Ambulatory Visit (HOSPITAL_COMMUNITY): Payer: Self-pay | Admitting: Internal Medicine

## 2021-01-14 ENCOUNTER — Other Ambulatory Visit (HOSPITAL_COMMUNITY): Payer: Self-pay

## 2021-01-14 MED ORDER — ENTRESTO 97-103 MG PO TABS: 1.0000 | ORAL_TABLET | Freq: Two times a day (BID) | ORAL | 0 refills | Status: DC

## 2021-01-16 ENCOUNTER — Other Ambulatory Visit (HOSPITAL_COMMUNITY): Payer: Self-pay

## 2021-01-17 ENCOUNTER — Other Ambulatory Visit (HOSPITAL_COMMUNITY): Payer: Self-pay | Admitting: *Deleted

## 2021-01-17 MED ORDER — ENTRESTO 97-103 MG PO TABS: 1.0000 | ORAL_TABLET | Freq: Two times a day (BID) | ORAL | 1 refills | Status: AC

## 2021-01-22 ENCOUNTER — Other Ambulatory Visit (HOSPITAL_COMMUNITY): Payer: Self-pay

## 2021-01-22 MED ORDER — CARVEDILOL 6.25 MG PO TABS: 6.2500 mg | ORAL_TABLET | Freq: Two times a day (BID) | ORAL | 0 refills | Status: DC

## 2021-02-18 ENCOUNTER — Other Ambulatory Visit (HOSPITAL_COMMUNITY): Payer: Self-pay | Admitting: Internal Medicine

## 2021-03-17 ENCOUNTER — Other Ambulatory Visit (HOSPITAL_COMMUNITY): Payer: Self-pay

## 2021-03-17 ENCOUNTER — Telehealth (HOSPITAL_COMMUNITY): Payer: Self-pay | Admitting: Internal Medicine

## 2021-03-17 MED ORDER — CARVEDILOL 6.25 MG PO TABS: 6.2500 mg | ORAL_TABLET | Freq: Two times a day (BID) | ORAL | 0 refills | Status: AC

## 2021-03-20 ENCOUNTER — Other Ambulatory Visit (HOSPITAL_COMMUNITY): Payer: Self-pay

## 2021-05-30 ENCOUNTER — Encounter (HOSPITAL_COMMUNITY): Payer: Self-pay | Admitting: Internal Medicine

## 2021-07-31 ENCOUNTER — Telehealth (HOSPITAL_COMMUNITY): Payer: Self-pay | Admitting: Pharmacy Technician

## 2021-07-31 NOTE — Telephone Encounter (Signed)
Advanced Heart Failure Patient Advocate Encounter  ? ?Patient was approved to receive Entresto from [employer] ? ?Effective dates: 07/30/21 through 07/31/22 ? ?Document scanned to chart. Called and left the patient a message.  ? ?Archer Asa, CPhT ? ? ? ?

## 2021-10-21 ENCOUNTER — Other Ambulatory Visit (HOSPITAL_COMMUNITY): Payer: Self-pay | Admitting: Internal Medicine

## 2021-12-26 ENCOUNTER — Other Ambulatory Visit (HOSPITAL_COMMUNITY): Payer: Self-pay

## 2021-12-26 MED ORDER — SPIRONOLACTONE 25 MG PO TABS: 25.0000 mg | ORAL_TABLET | Freq: Every day | ORAL | 0 refills | Status: DC

## 2022-03-04 ENCOUNTER — Encounter (HOSPITAL_COMMUNITY): Payer: Self-pay | Admitting: Internal Medicine

## 2022-03-06 ENCOUNTER — Other Ambulatory Visit (HOSPITAL_COMMUNITY): Payer: Self-pay

## 2022-03-06 MED ORDER — SPIRONOLACTONE 25 MG PO TABS: 25.0000 mg | ORAL_TABLET | Freq: Every day | ORAL | 0 refills | Status: AC

## 2022-03-06 MED ORDER — ATORVASTATIN CALCIUM 20 MG PO TABS: ORAL_TABLET | ORAL | 0 refills | Status: AC

## 2022-06-17 ENCOUNTER — Other Ambulatory Visit (HOSPITAL_COMMUNITY): Payer: Self-pay

## 2022-07-08 ENCOUNTER — Other Ambulatory Visit (HOSPITAL_COMMUNITY): Payer: Self-pay

## 2022-07-08 ENCOUNTER — Telehealth (HOSPITAL_COMMUNITY): Payer: Self-pay

## 2022-07-08 NOTE — Telephone Encounter (Signed)
Advanced Heart Failure Patient Advocate Encounter  Patient is due to renew assistance for Entresto with [employer].  Left voicemail for patient to call back so this process can be started.  Clista Bernhardt, CPhT Rx Patient Advocate Phone: [phone]

## 2022-07-13 ENCOUNTER — Other Ambulatory Visit (HOSPITAL_COMMUNITY): Payer: Self-pay

## 2022-07-13 NOTE — Telephone Encounter (Signed)
Advanced Heart Failure Patient Advocate Encounter  Application for Entresto faxed to [employer] on 07/13/22. Application form attached to patient chart.

## 2022-07-15 NOTE — Telephone Encounter (Signed)
Advanced Heart Failure Patient Advocate Encounter  Patient was approved to receive Entresto from [employer] Effective 07/15/22 to 07/31/23  Left vm for patient regarding approval.
